# Patient Record
Sex: Male | Born: 1960
Health system: Southern US, Community
[De-identification: ages and names within clinical notes are randomized; demographics above are authoritative.]

## PROBLEM LIST (undated history)

## (undated) DIAGNOSIS — I10 Essential (primary) hypertension: Secondary | ICD-10-CM

## (undated) DIAGNOSIS — M109 Gout, unspecified: Secondary | ICD-10-CM

## (undated) DIAGNOSIS — R112 Nausea with vomiting, unspecified: Secondary | ICD-10-CM

## (undated) DIAGNOSIS — D649 Anemia, unspecified: Secondary | ICD-10-CM

## (undated) DIAGNOSIS — M1990 Unspecified osteoarthritis, unspecified site: Secondary | ICD-10-CM

## (undated) DIAGNOSIS — Z9889 Other specified postprocedural states: Secondary | ICD-10-CM

## (undated) HISTORY — PX: COLONOSCOPY: SHX174

## (undated) HISTORY — PX: FRACTURE SURGERY: SHX138

---

## 2005-10-12 ENCOUNTER — Emergency Department (HOSPITAL_COMMUNITY): Admission: EM | Admit: 2005-10-12 | Discharge: 2005-10-12 | Payer: Self-pay | Admitting: Emergency Medicine

## 2011-04-27 ENCOUNTER — Encounter (HOSPITAL_COMMUNITY): Payer: Self-pay | Admitting: Pharmacy Technician

## 2011-04-28 ENCOUNTER — Encounter (HOSPITAL_COMMUNITY): Payer: Self-pay

## 2011-04-28 ENCOUNTER — Encounter (HOSPITAL_COMMUNITY)
Admission: RE | Admit: 2011-04-28 | Discharge: 2011-04-28 | Disposition: A | Discharge status: Home / Self Care | Payer: BC Managed Care – PPO | Source: Ambulatory Visit | Attending: Orthopaedic Surgery | Admitting: Orthopaedic Surgery

## 2011-04-28 HISTORY — DX: Unspecified osteoarthritis, unspecified site: M19.90

## 2011-04-28 LAB — URINALYSIS, ROUTINE W REFLEX MICROSCOPIC
Glucose, UA: NEGATIVE mg/dL
Ketones, ur: NEGATIVE mg/dL
Leukocytes, UA: NEGATIVE
Nitrite: NEGATIVE
Protein, ur: NEGATIVE mg/dL
pH: 6 (ref 5.0–8.0)

## 2011-04-28 LAB — BASIC METABOLIC PANEL
BUN: 17 mg/dL (ref 6–23)
GFR calc non Af Amer: >90 mL/min (ref >90)
Potassium: 4 mEq/L (ref 3.5–5.1)
Sodium: 139 mEq/L (ref 135–145)

## 2011-04-28 LAB — CBC
Hemoglobin: 13.3 g/dL (ref 13.0–17.0)
Platelets: 189 10*3/uL (ref 150–400)
RDW: 13 % (ref 11.5–15.5)

## 2011-04-28 LAB — PROTIME-INR: Prothrombin Time: 12.5 seconds (ref 11.6–15.2)

## 2011-04-28 LAB — SURGICAL PCR SCREEN: Staphylococcus aureus: POSITIVE — AB

## 2011-04-28 LAB — APTT: aPTT: 29 seconds (ref 24–37)

## 2011-04-28 NOTE — Pre-Procedure Instructions (Signed)
Chest xray and EKG not required at PST appt due to negative history

## 2011-04-28 NOTE — Patient Instructions (Addendum)
20 [name]  04/28/2011   Your procedure is scheduled on:  05/01/11   Surgery [id number]  Friday  Report to [facility] at 0515 AM.  Call this number if you have problems the morning of surgery: [phone]              Terry Burton   [phone]  Remember:   Do not eat food:After Midnight. Thursday night  May have clear liquids:until Midnight .Thursday night  Clear liquids include soda, tea, black coffee, apple or grape juice, broth.  Take these medicines the morning of surgery with A SIP OF WATER: none   Do not wear jewelry, make-up or nail polish.  Do not wear lotions, powders, or perfumes. You may wear deodorant.  Do not shave 48 hours prior to surgery.  Do not bring valuables to the hospital.  Contacts, dentures or bridgework may not be worn into surgery.  Leave suitcase in the car. After surgery it may be brought to your room.  For patients admitted to the hospital, checkout time is 11:00 AM the day of discharge.   Patients discharged the day of surgery will not be allowed to drive home.  Name and phone number of your driver: wife  Special Instructions: CHG Shower Use Special Wash: 1/2 bottle night before surgery and 1/2 bottle morning of surgery.     Regular soap face and privates  Please read over the following fact sheets that you were given: MRSA Information

## 2011-05-01 ENCOUNTER — Inpatient Hospital Stay (HOSPITAL_COMMUNITY): Payer: BC Managed Care – PPO

## 2011-05-01 ENCOUNTER — Encounter (HOSPITAL_COMMUNITY): Payer: Self-pay | Admitting: Anesthesiology

## 2011-05-01 ENCOUNTER — Inpatient Hospital Stay (HOSPITAL_COMMUNITY): Payer: BC Managed Care – PPO | Admitting: Anesthesiology

## 2011-05-01 ENCOUNTER — Encounter (HOSPITAL_COMMUNITY): Payer: Self-pay | Admitting: *Deleted

## 2011-05-01 ENCOUNTER — Inpatient Hospital Stay (HOSPITAL_COMMUNITY)
Admission: RE | Admit: 2011-05-01 | Discharge: 2011-05-05 | DRG: 818 | Disposition: A | Discharge status: Home Health | Payer: BC Managed Care – PPO | Source: Ambulatory Visit | Attending: Orthopaedic Surgery | Admitting: Orthopaedic Surgery

## 2011-05-01 ENCOUNTER — Encounter (HOSPITAL_COMMUNITY)
Admission: RE | Disposition: A | Discharge status: Home Health | Payer: Self-pay | Source: Ambulatory Visit | Attending: Orthopaedic Surgery

## 2011-05-01 DIAGNOSIS — M1610 Unilateral primary osteoarthritis, unspecified hip: Principal | ICD-10-CM

## 2011-05-01 DIAGNOSIS — M8970 Major osseous defect, unspecified site: Secondary | ICD-10-CM | POA: Diagnosis present

## 2011-05-01 DIAGNOSIS — M87059 Idiopathic aseptic necrosis of unspecified femur: Secondary | ICD-10-CM | POA: Diagnosis present

## 2011-05-01 DIAGNOSIS — Z01812 Encounter for preprocedural laboratory examination: Secondary | ICD-10-CM

## 2011-05-01 DIAGNOSIS — D62 Acute posthemorrhagic anemia: Secondary | ICD-10-CM | POA: Diagnosis not present

## 2011-05-01 DIAGNOSIS — M169 Osteoarthritis of hip, unspecified: Principal | ICD-10-CM | POA: Diagnosis present

## 2011-05-01 HISTORY — PX: TOTAL HIP ARTHROPLASTY: SHX124

## 2011-05-01 LAB — TYPE AND SCREEN: ABO/RH(D): O POS

## 2011-05-01 LAB — ABO/RH: ABO/RH(D): O POS

## 2011-05-01 SURGERY — ARTHROPLASTY, HIP, TOTAL, ANTERIOR APPROACH
Anesthesia: General | Site: Hip | Laterality: Right | Wound class: Clean

## 2011-05-01 MED ORDER — ONDANSETRON HCL 4 MG/2ML IJ SOLN: 4.0000 mg | Freq: Four times a day (QID) | INTRAMUSCULAR | Status: DC | PRN

## 2011-05-01 MED ORDER — LIDOCAINE HCL (CARDIAC) 20 MG/ML IV SOLN
INTRAVENOUS | Status: DC | PRN
  Administered 2011-05-01: 50 mg via INTRAVENOUS

## 2011-05-01 MED ORDER — LACTATED RINGERS IV SOLN: INTRAVENOUS | Status: DC

## 2011-05-01 MED ORDER — DIPHENHYDRAMINE HCL 50 MG/ML IJ SOLN: 12.5000 mg | Freq: Four times a day (QID) | INTRAMUSCULAR | Status: DC | PRN

## 2011-05-01 MED ORDER — HYDROMORPHONE HCL PF 1 MG/ML IJ SOLN
0.2500 mg | INTRAMUSCULAR | Status: DC | PRN
  Administered 2011-05-01 (×3): 0.25 mg via INTRAVENOUS
  Administered 2011-05-01: 0.5 mg via INTRAVENOUS

## 2011-05-01 MED ORDER — METOCLOPRAMIDE HCL 10 MG PO TABS: 5.0000 mg | ORAL_TABLET | Freq: Three times a day (TID) | ORAL | Status: DC | PRN

## 2011-05-01 MED ORDER — OXYCODONE HCL 5 MG PO TABS
5.0000 mg | ORAL_TABLET | ORAL | Status: DC | PRN
  Administered 2011-05-04: 10 mg via ORAL
  Administered 2011-05-04 – 2011-05-05 (×4): 5 mg via ORAL
  Filled 2011-05-01 (×2): qty 1
  Filled 2011-05-01 (×2): qty 2
  Filled 2011-05-01: qty 1

## 2011-05-01 MED ORDER — NEOSTIGMINE METHYLSULFATE 1 MG/ML IJ SOLN
INTRAMUSCULAR | Status: DC | PRN
  Administered 2011-05-01: 3 mg via INTRAVENOUS

## 2011-05-01 MED ORDER — DIPHENHYDRAMINE HCL 12.5 MG/5ML PO ELIX: 12.5000 mg | ORAL_SOLUTION | Freq: Four times a day (QID) | ORAL | Status: DC | PRN

## 2011-05-01 MED ORDER — ACETAMINOPHEN 325 MG PO TABS
650.0000 mg | ORAL_TABLET | Freq: Four times a day (QID) | ORAL | Status: DC | PRN
  Administered 2011-05-01 – 2011-05-03 (×3): 650 mg via ORAL
  Filled 2011-05-01 (×3): qty 2

## 2011-05-01 MED ORDER — CEFAZOLIN SODIUM 1-5 GM-% IV SOLN
1.0000 g | Freq: Four times a day (QID) | INTRAVENOUS | Status: AC
  Administered 2011-05-01 – 2011-05-02 (×3): 1 g via INTRAVENOUS
  Filled 2011-05-01 (×4): qty 50

## 2011-05-01 MED ORDER — MENTHOL 3 MG MT LOZG: 1.0000 | LOZENGE | OROMUCOSAL | Status: DC | PRN

## 2011-05-01 MED ORDER — MEPERIDINE HCL 50 MG/ML IJ SOLN: 6.2500 mg | INTRAMUSCULAR | Status: DC | PRN

## 2011-05-01 MED ORDER — HYDROCODONE-ACETAMINOPHEN 5-325 MG PO TABS: 1.0000 | ORAL_TABLET | ORAL | Status: DC | PRN

## 2011-05-01 MED ORDER — ONDANSETRON HCL 4 MG PO TABS: 4.0000 mg | ORAL_TABLET | Freq: Four times a day (QID) | ORAL | Status: DC | PRN

## 2011-05-01 MED ORDER — PROMETHAZINE HCL 25 MG/ML IJ SOLN: 6.2500 mg | INTRAMUSCULAR | Status: DC | PRN

## 2011-05-01 MED ORDER — SODIUM CHLORIDE 0.9 % IJ SOLN: 9.0000 mL | INTRAMUSCULAR | Status: DC | PRN

## 2011-05-01 MED ORDER — MORPHINE SULFATE (PF) 1 MG/ML IV SOLN
INTRAVENOUS | Status: DC
  Administered 2011-05-01: 1 mg via INTRAVENOUS
  Administered 2011-05-01: 3 mg via INTRAVENOUS
  Administered 2011-05-02: 4.5 mg via INTRAVENOUS
  Administered 2011-05-02 (×2): via INTRAVENOUS
  Administered 2011-05-02: 7.5 mg via INTRAVENOUS
  Administered 2011-05-02: 9 mg via INTRAVENOUS
  Administered 2011-05-02: 7.5 mg via INTRAVENOUS
  Administered 2011-05-02: 3 mg via INTRAVENOUS
  Administered 2011-05-03 (×3): 4.5 mg via INTRAVENOUS
  Administered 2011-05-03 (×2): 3 mg via INTRAVENOUS
  Administered 2011-05-03: 22:00:00 via INTRAVENOUS
  Administered 2011-05-03: 1.5 mg via INTRAVENOUS
  Administered 2011-05-04: 6 mg via INTRAVENOUS
  Administered 2011-05-04: 7.5 mg via INTRAVENOUS
  Filled 2011-05-01 (×4): qty 25

## 2011-05-01 MED ORDER — GLYCOPYRROLATE 0.2 MG/ML IJ SOLN
INTRAMUSCULAR | Status: DC | PRN
  Administered 2011-05-01: .5 mg via INTRAVENOUS

## 2011-05-01 MED ORDER — CEFAZOLIN SODIUM-DEXTROSE 2-3 GM-% IV SOLR
2.0000 g | Freq: Once | INTRAVENOUS | Status: AC
  Administered 2011-05-01: 2 g via INTRAVENOUS

## 2011-05-01 MED ORDER — THERA M PLUS PO TABS
1.0000 | ORAL_TABLET | Freq: Every day | ORAL | Status: DC
  Administered 2011-05-02: 1 via ORAL
  Administered 2011-05-03 – 2011-05-04 (×2): via ORAL
  Administered 2011-05-05: 1 via ORAL
  Filled 2011-05-01 (×5): qty 1

## 2011-05-01 MED ORDER — ROCURONIUM BROMIDE 100 MG/10ML IV SOLN
INTRAVENOUS | Status: DC | PRN
  Administered 2011-05-01: 10 mg via INTRAVENOUS
  Administered 2011-05-01: 40 mg via INTRAVENOUS

## 2011-05-01 MED ORDER — METHOCARBAMOL 100 MG/ML IJ SOLN
500.0000 mg | Freq: Four times a day (QID) | INTRAVENOUS | Status: DC | PRN
  Administered 2011-05-01: 500 mg via INTRAVENOUS
  Filled 2011-05-01 (×2): qty 5

## 2011-05-01 MED ORDER — ACETAMINOPHEN 650 MG RE SUPP: 650.0000 mg | Freq: Four times a day (QID) | RECTAL | Status: DC | PRN

## 2011-05-01 MED ORDER — PHENOL 1.4 % MT LIQD: 1.0000 | OROMUCOSAL | Status: DC | PRN

## 2011-05-01 MED ORDER — METOCLOPRAMIDE HCL 5 MG/ML IJ SOLN: 5.0000 mg | Freq: Three times a day (TID) | INTRAMUSCULAR | Status: DC | PRN

## 2011-05-01 MED ORDER — MIDAZOLAM HCL 5 MG/5ML IJ SOLN
INTRAMUSCULAR | Status: DC | PRN
  Administered 2011-05-01: 2 mg via INTRAVENOUS

## 2011-05-01 MED ORDER — NALOXONE HCL 0.4 MG/ML IJ SOLN: 0.4000 mg | INTRAMUSCULAR | Status: DC | PRN

## 2011-05-01 MED ORDER — ONDANSETRON HCL 4 MG/2ML IJ SOLN
4.0000 mg | Freq: Four times a day (QID) | INTRAMUSCULAR | Status: DC | PRN
  Filled 2011-05-01: qty 2

## 2011-05-01 MED ORDER — SODIUM CHLORIDE 0.9 % IR SOLN
Status: DC | PRN
  Administered 2011-05-01: 1000 mL

## 2011-05-01 MED ORDER — METHOCARBAMOL 500 MG PO TABS
500.0000 mg | ORAL_TABLET | Freq: Four times a day (QID) | ORAL | Status: DC | PRN
  Administered 2011-05-01 – 2011-05-04 (×6): 500 mg via ORAL
  Filled 2011-05-01 (×6): qty 1

## 2011-05-01 MED ORDER — LACTATED RINGERS IV SOLN
INTRAVENOUS | Status: DC | PRN
  Administered 2011-05-01 (×3): via INTRAVENOUS

## 2011-05-01 MED ORDER — ONDANSETRON HCL 4 MG/2ML IJ SOLN
INTRAMUSCULAR | Status: DC | PRN
  Administered 2011-05-01: 4 mg via INTRAVENOUS

## 2011-05-01 MED ORDER — RIVAROXABAN 10 MG PO TABS
10.0000 mg | ORAL_TABLET | Freq: Every day | ORAL | Status: DC
  Administered 2011-05-02 – 2011-05-05 (×4): 10 mg via ORAL
  Filled 2011-05-01 (×4): qty 1

## 2011-05-01 MED ORDER — FENTANYL CITRATE 0.05 MG/ML IJ SOLN
INTRAMUSCULAR | Status: DC | PRN
  Administered 2011-05-01 (×3): 50 ug via INTRAVENOUS
  Administered 2011-05-01: 100 ug via INTRAVENOUS
  Administered 2011-05-01 (×4): 50 ug via INTRAVENOUS

## 2011-05-01 MED ORDER — MORPHINE SULFATE 2 MG/ML IJ SOLN: 1.0000 mg | INTRAMUSCULAR | Status: DC | PRN

## 2011-05-01 MED ORDER — PROPOFOL 10 MG/ML IV EMUL
INTRAVENOUS | Status: DC | PRN
  Administered 2011-05-01: 180 mg via INTRAVENOUS

## 2011-05-01 MED ORDER — ACETAMINOPHEN 10 MG/ML IV SOLN
INTRAVENOUS | Status: DC | PRN
  Administered 2011-05-01: 1000 mg via INTRAVENOUS

## 2011-05-01 MED ORDER — DIPHENHYDRAMINE HCL 12.5 MG/5ML PO ELIX: 12.5000 mg | ORAL_SOLUTION | ORAL | Status: DC | PRN

## 2011-05-01 MED ORDER — ZOLPIDEM TARTRATE 5 MG PO TABS: 5.0000 mg | ORAL_TABLET | Freq: Every evening | ORAL | Status: DC | PRN

## 2011-05-01 MED ORDER — SODIUM CHLORIDE 0.9 % IV SOLN
INTRAVENOUS | Status: DC
  Administered 2011-05-01: 19:00:00 via INTRAVENOUS
  Administered 2011-05-02 – 2011-05-03 (×3): 1000 mL via INTRAVENOUS

## 2011-05-01 SURGICAL SUPPLY — 38 items
BAG SPEC THK2 15X12 ZIP CLS (MISCELLANEOUS) ×2
BAG ZIPLOCK 12X15 (MISCELLANEOUS) ×4 IMPLANT
BLADE SAW SGTL 18X1.27X75 (BLADE) ×2 IMPLANT
CELLS DAT CNTRL 66122 CELL SVR (MISCELLANEOUS) ×1 IMPLANT
CLOTH BEACON ORANGE TIMEOUT ST (SAFETY) ×2 IMPLANT
DRAPE C-ARM 42X72 X-RAY (DRAPES) ×2 IMPLANT
DRAPE ORTHO SPLIT 77X108 STRL (DRAPES) ×2
DRAPE STERI IOBAN 125X83 (DRAPES) ×2 IMPLANT
DRAPE SURG ORHT 6 SPLT 77X108 (DRAPES) ×1 IMPLANT
DRAPE U-SHAPE 47X51 STRL (DRAPES) ×6 IMPLANT
DRSG MEPILEX BORDER 4X8 (GAUZE/BANDAGES/DRESSINGS) ×2 IMPLANT
DRSG XEROFORM 1X8 (GAUZE/BANDAGES/DRESSINGS) ×1 IMPLANT
ELECT BLADE TIP CTD 4 INCH (ELECTRODE) ×2 IMPLANT
ELECT REM PT RETURN 9FT ADLT (ELECTROSURGICAL) ×2
ELECTRODE REM PT RTRN 9FT ADLT (ELECTROSURGICAL) ×1 IMPLANT
EVACUATOR 1/8 PVC DRAIN (DRAIN) IMPLANT
FACESHIELD LNG OPTICON STERILE (SAFETY) ×8 IMPLANT
GAUZE XEROFORM 1X8 LF (GAUZE/BANDAGES/DRESSINGS) ×2 IMPLANT
GLOVE BIO SURGEON STRL SZ7 (GLOVE) ×1 IMPLANT
GLOVE BIO SURGEON STRL SZ7.5 (GLOVE) ×2 IMPLANT
GLOVE BIOGEL PI IND STRL 7.5 (GLOVE) IMPLANT
GLOVE BIOGEL PI IND STRL 8 (GLOVE) ×1 IMPLANT
GLOVE BIOGEL PI INDICATOR 7.5 (GLOVE)
GLOVE BIOGEL PI INDICATOR 8 (GLOVE) ×1
GOWN STRL REIN XL XLG (GOWN DISPOSABLE) ×2 IMPLANT
KIT BASIN OR (CUSTOM PROCEDURE TRAY) ×2 IMPLANT
PACK TOTAL JOINT (CUSTOM PROCEDURE TRAY) ×2 IMPLANT
PADDING CAST COTTON 6X4 STRL (CAST SUPPLIES) ×2 IMPLANT
RETRACTOR WND ALEXIS 18 MED (MISCELLANEOUS) ×1 IMPLANT
RTRCTR WOUND ALEXIS 18CM MED (MISCELLANEOUS) ×2
STAPLER SKIN PROX WIDE 3.9 (STAPLE) IMPLANT
SUT ETHIBOND NAB CT1 #1 30IN (SUTURE) ×4 IMPLANT
SUT MNCRL AB 4-0 PS2 18 (SUTURE) ×2 IMPLANT
SUT VIC AB 1 CT1 36 (SUTURE) ×4 IMPLANT
SUT VIC AB 2-0 CT1 27 (SUTURE) ×4
SUT VIC AB 2-0 CT1 TAPERPNT 27 (SUTURE) ×2 IMPLANT
TOWEL OR 17X26 10 PK STRL BLUE (TOWEL DISPOSABLE) ×4 IMPLANT
TRAY FOLEY CATH 14FRSI W/METER (CATHETERS) ×2 IMPLANT

## 2011-05-01 NOTE — Anesthesia Postprocedure Evaluation (Signed)
  Anesthesia Post-op Note  Patient: Terry Burton  Procedure(s) Performed:  TOTAL HIP ARTHROPLASTY ANTERIOR APPROACH  Patient Location: PACU  Anesthesia Type: General  Level of Consciousness: awake and alert   Airway and Oxygen Therapy: Patient Spontanous Breathing  Post-op Pain: mild  Post-op Assessment: Post-op Vital signs reviewed, Patient's Cardiovascular Status Stable, Respiratory Function Stable, Patent Airway and No signs of Nausea or vomiting  Post-op Vital Signs: stable  Complications: No apparent anesthesia complications

## 2011-05-01 NOTE — Op Note (Signed)
NAMEPOWELL, HALBERT NO.:  [id number]  MEDICAL RECORD NO.:  [id number]  LOCATION:  1618                         FACILITY:  [facility]  PHYSICIAN:  Vanita Panda. 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  PROCEDURE:  Right total hip arthroplasty through direct anterior approach.  IMPLANTS:  [employer] acetabular component size 54 with 2 screws, neutral +4 36 polyethylene liner, size 15 standard [place]ail femoral component, size 36 +1.5 ceramic hip ball.  SURGEON:  Vanita Panda. Magnus Ivan, M.D.  ASSISTANT:  Wende Neighbors, P.A.  ANESTHESIA:  General.  ANTIBIOTICS:  2 g IV Ancef.  BLOOD LOSS:  400 cc.  COMPLICATIONS:  None.  INDICATIONS:  Mr. Terry Burton is a 50 year old gentleman with severe pain for the last 3 to 4 years involving his right hip.  This has greatly affected his activities of daily living and his quality of life.  He takes antiinflammatories, and he has tried everything in the hip.  X- rays showed severe cystic changes in the femoral head with collapse and degenerative changes on the acetabular side and basically end-stage avascular necrosis.  At this point, he wishes to proceed with a total hip arthroplasty given the impact on his quality of life, the severity of his pain and the x-ray findings.  The risks and benefits of the surgery were explained to him in detail, including the risk of acute blood loss anemia, DVT, and PE.  PROCEDURE DESCRIPTION:  After informed consent was obtained, the appropriate right hip was marked.  He was brought to the operating room and general anesthesia was obtained while he was on a stretcher.   Foley catheter was placed, his feet were placed in traction boots for placement on the Hana fracture table.  He was placed on this table and a perineal post and padding was placed as well.  Both legs were placed in in-line skeletal traction devices, but no traction was applied.  His right hip was then prepped and draped with DuraPrep and sterile drapes. A time-out was called to identify the correct patient and correct right hip.  I then made an incision 1 cm distal and 3 cm posterior to the anterosuperior iliac spine and carried this obliquely down the leg.  I dissected down to the tensor fascia lata and then this tensor fascia lata fascia was divided longitudinally.  I then proceeded with a direct anterior approach to the hip.  A Cobra retractor was placed under the lateral neck and then up under the rectus femoris and a medial retractor was placed.  I cauterized the lateral femoral circumflex vessels and removed fat overlying the joint capsule.  I then divided the joint capsule and put retaining sutures in the joint capsule.  I then made my femoral neck cut proximal to the lesser trochanter and removed the head in its entirety.  It had significant disease and collapse.  Next, I cleaned  the acetabulum from debris and then began reaming from size 44 reamer up to a 53 with the last few reamers placed under direct fluoroscopy and direct visualization as well.  Once I was pleased with my inclination and inversion, I placed the real acetabular component in place.  I placed 2 screw holes as well.  I placed 2 screws to help secure the cup as well, and then the real 36+ 4 neutral polyethylene liner.  The acetabular component was [employer] size 54 with Gription.  Next, attention was turned to the femur.  With all traction still off the leg, a temporary hook was placed under the vastus ridge. The vastus ridge and the leg was externally rotated to 90 degrees, extended and adducted.  I  then released tissue from around the greater trochanter and placed retractors and was able to use the canal finding guide, then a broach, and began  broaching from a size 8 broach up to a size 15 broach.  The 15 broach was felt to be stable, so I trialed the standard neck and a 36+ 1 trial hip ball.  We reduced this into the acetabulum under direct fluoroscopic guidance.  We could see that the leg lengths were equal.  We could rotate him externally to past 90 degrees and it was stable, as well as internal, and there was minimal shuck.  We then removed all trial components and placed the real hydroxyapatite (HA) coated femoral component size 15 with a collar followed by a standard neck and a 36 +1.5 hip ball.  We reduced this into the acetabulum.  Again, it was felt to be stable.  We copiously irrigated the tissue.  Then, I closed the joint capsule with interrupted #1 Ethibond suture.  I reapproximated the tensor fascia lata with a running #1 Vicryl followed by 2-0 Vicryl interrupted on the subcutaneous tissue and staples on the skin.  Xeroform followed by well-padded sterile dressing was applied.  The patient was taken off the Hana table. His leg lengths were felt to be equal.  He was awakened, extubated, taken to recovery room in stable condition.  All final counts were correct.  There were no complications noted.  Of note, Maud Deed [facility] was present during the entirety of the case and her assistance was integral  for getting the case completed.     Vanita Panda. Magnus Ivan, M.D.     CYB/MEDQ  D:  05/01/2011  T:  05/01/2011  Job:  [id number]

## 2011-05-01 NOTE — H&P (Signed)
Terry Burton is an 50 y.o. male.   Chief Complaint: Severe right hip pain, know DJD/OA - likely AVN HPI: 50 yo male with severe right hip pain for greater than 3 years.  Xray with late stage AVN. This has greatly affected his quality of life.  Failed NSAIDs, time, etc.  Past Medical History  Diagnosis Date  . Arthritis     right hip and back with pain down right leg    Past Surgical History  Procedure Date  . Fracture surgery     repair right elbow years ago- no retained hardware    History reviewed. No pertinent family history. Social History:  reports that he quit smoking about 13 years ago. He has never used smokeless tobacco. He reports that he drinks about 3.6 ounces of alcohol per week. He reports that he uses illicit drugs (Marijuana).  Allergies: No Known Allergies  Medications Prior to Admission  Medication Dose Route Frequency Provider Last Rate Last Dose  . ceFAZolin (ANCEF) IVPB 2 g/50 mL premix  2 g Intravenous Once Kathryne Hitch       No current outpatient prescriptions on file as of 05/01/2011.    Results for orders placed during the hospital encounter of 05/01/11 (from the past 48 hour(s))  TYPE AND SCREEN     Status: Normal   Collection Time   05/01/11  5:35 AM      Component Value Range Comment   ABO/RH(D) O POS      Antibody Screen NEG      Sample Expiration 05/04/2011     ABO/RH     Status: Normal   Collection Time   05/01/11  5:35 AM      Component Value Range Comment   ABO/RH(D) O POS      No results found.  Review of Systems  Constitutional: Negative.   HENT: Negative.   Eyes: Negative.   Respiratory: Negative.   Cardiovascular: Negative.   Gastrointestinal: Negative.   Genitourinary: Negative.   Musculoskeletal: Positive for joint pain.  Skin: Negative.   Neurological: Negative.   Endo/Heme/Allergies: Negative.   Psychiatric/Behavioral: Negative.   All other systems reviewed and are negative.    Blood pressure 139/94,  pulse 73, temperature 97.7 F (36.5 C), temperature source Oral, resp. rate 18. Physical Exam  Constitutional: He is oriented to person, place, and time. He appears well-developed and well-nourished.  HENT:  Head: Normocephalic and atraumatic.  Eyes: EOM are normal. Pupils are equal, round, and reactive to light.  Neck: Normal range of motion. Neck supple.  Cardiovascular: Normal rate and regular rhythm.   Respiratory: Effort normal and breath sounds normal.  GI: Soft. Bowel sounds are normal.  Musculoskeletal:       Right hip: He exhibits decreased range of motion, bony tenderness and crepitus.  Neurological: He is alert and oriented to person, place, and time.  Skin: Skin is warm and dry.  Psychiatric: He has a normal mood and affect.     Assessment/Plan Severe right hip disease 1) to OR today for right total hip replacement.  Tatjana Turcott Y 05/01/2011, 7:22 AM

## 2011-05-01 NOTE — Anesthesia Procedure Notes (Signed)
Procedure Name: Intubation Date/Time: 05/01/2011 7:37 AM Performed by: Sherald Barge Pre-anesthesia Checklist: Patient identified, Emergency Drugs available, Suction available, Patient being monitored and Timeout performed Preoxygenation: Pre-oxygenation with 100% oxygen Intubation Type: IV induction Ventilation: Mask ventilation without difficulty Laryngoscope Size: Mac and 4 Grade View: Grade II Tube type: Oral Tube size: 7.5 mm Number of attempts: 1 Placement Confirmation: ETT inserted through vocal cords under direct vision,  positive ETCO2,  CO2 detector and breath sounds checked- equal and bilateral Secured at: 21 cm Tube secured with: Tape Dental Injury: Teeth and Oropharynx as per pre-operative assessment

## 2011-05-01 NOTE — Addendum Note (Signed)
Addendum  created 05/01/11 1020 by Sherald Barge   Modules edited:Anesthesia Blocks and Procedures, Inpatient Notes

## 2011-05-01 NOTE — Addendum Note (Signed)
Addendum  created 05/01/11 1020 by Malayshia All   Modules edited:Anesthesia Blocks and Procedures, Inpatient Notes    

## 2011-05-01 NOTE — Anesthesia Preprocedure Evaluation (Addendum)
Anesthesia Evaluation  Patient identified by MRN, date of birth, ID band Patient awake    Reviewed: Allergy & Precautions, H&P , NPO status , Patient's Chart, lab work & pertinent test results  Airway Mallampati: II TM Distance: >3 FB Neck ROM: Full    Dental  (+) Missing,    Pulmonary neg pulmonary ROS,  clear to auscultation  Pulmonary exam normal       Cardiovascular neg cardio ROS Regular Normal    Neuro/Psych Negative Neurological ROS  Negative Psych ROS   GI/Hepatic negative GI ROS, Neg liver ROS,   Endo/Other  Negative Endocrine ROS  Renal/GU negative Renal ROS  Genitourinary negative   Musculoskeletal negative musculoskeletal ROS (+)   Abdominal   Peds negative pediatric ROS (+)  Hematology negative hematology ROS (+)   Anesthesia Other Findings   Reproductive/Obstetrics negative OB ROS                          Anesthesia Physical Anesthesia Plan  ASA: I  Anesthesia Plan: General   Post-op Pain Management:    Induction: Intravenous  Airway Management Planned: Oral ETT  Additional Equipment:   Intra-op Plan:   Post-operative Plan: Extubation in OR  Informed Consent: I have reviewed the patients History and Physical, chart, labs and discussed the procedure including the risks, benefits and alternatives for the proposed anesthesia with the patient or authorized representative who has indicated his/her understanding and acceptance.   Dental advisory given  Plan Discussed with: CRNA  Anesthesia Plan Comments:        Anesthesia Quick Evaluation

## 2011-05-01 NOTE — Brief Op Note (Signed)
05/01/2011  9:38 AM  PATIENT:  Terry Burton  50 y.o. male  PRE-OPERATIVE DIAGNOSIS:  avascular necrosis right hip  POST-OPERATIVE DIAGNOSIS:  avascular necrosis right hip  PROCEDURE:  Procedure(s): TOTAL HIP ARTHROPLASTY ANTERIOR APPROACH  SURGEON:  Surgeon(s): Kathryne Hitch  PHYSICIAN ASSISTANT:   ASSISTANTS: Maud Deed, PA-C   ANESTHESIA:   general  EBL:  Total I/O In: 2000 [I.V.:2000] Out: 950 [Urine:550; Blood:400]  BLOOD ADMINISTERED:none  DRAINS: none   LOCAL MEDICATIONS USED:  NONE  SPECIMEN:  No Specimen  DISPOSITION OF SPECIMEN:  N/A  COUNTS:  YES  TOURNIQUET:  * No tourniquets in log *  DICTATION: .Other Dictation: Dictation Number [id number]  PLAN OF CARE: Admit to inpatient   PATIENT DISPOSITION:  PACU - hemodynamically stable.   Delay start of Pharmacological VTE agent (>24hrs) due to surgical blood loss or risk of bleeding:  {YES/NO/NOT APPLICABLE:20182

## 2011-05-01 NOTE — Transfer of Care (Signed)
Immediate Anesthesia Transfer of Care Note  Patient: Terry Burton  Procedure(s) Performed:  TOTAL HIP ARTHROPLASTY ANTERIOR APPROACH  Patient Location: PACU  Anesthesia Type: General  Level of Consciousness: sedated, patient cooperative and responds to stimulaton  Airway & Oxygen Therapy: Patient Spontanous Breathing and Patient connected to face mask oxgen  Post-op Assessment: Report given to PACU RN and Post -op Vital signs reviewed and stable  Post vital signs: Reviewed and stable  Complications: No apparent anesthesia complications

## 2011-05-02 LAB — CBC
MCHC: 32.9 g/dL (ref 30.0–36.0)
RDW: 13.3 % (ref 11.5–15.5)

## 2011-05-02 LAB — BASIC METABOLIC PANEL
BUN: 11 mg/dL (ref 6–23)
GFR calc Af Amer: >90 mL/min (ref >90)
GFR calc non Af Amer: >90 mL/min (ref >90)
Potassium: 3.8 mEq/L (ref 3.5–5.1)
Sodium: 136 mEq/L (ref 135–145)

## 2011-05-02 NOTE — Progress Notes (Signed)
Physical Therapy Evaluation Patient Details Name: Terry Burton MRN: [id number] DOB: 01-21-1961 Today's Date: 05/02/2011 9:10-9:40 EVII  Pt with ant THR who has post op pain and difficulty moving initially.  Expect he will progress according to path and D/ C to home with HHPT.  He will need a RW for home  Problem List:  Patient Active Problem List  Diagnoses  . Hip arthritis    Past Medical History:  Past Medical History  Diagnosis Date  . Arthritis     right hip and back with pain down right leg   Past Surgical History:  Past Surgical History  Procedure Date  . Fracture surgery     repair right elbow years ago- no retained hardware    PT Assessment/Plan/Recommendation PT Assessment Clinical Impression Statement: Pt one day post ant THR who has pain and likely some effects of medication.  Ex;pect he will imporve quickly and progress according to path and D/C to home with HHPT PT Recommendation/Assessment: Patient will need skilled PT in the acute care venue PT Problem List: Decreased strength;Decreased activity tolerance;Decreased mobility;Pain;Decreased knowledge of use of DME Barriers to Discharge: None Barriers to Discharge Comments: bedroom is on second floor, but pt can stay on first floor temporarily if necessasry PT Therapy Diagnosis : Difficulty walking;Generalized weakness;Acute pain PT Plan PT Frequency: 7X/week PT Treatment/Interventions: DME instruction;Gait training;Stair training;Functional mobility training;Therapeutic activities;Therapeutic exercise;Patient/family education PT Recommendation Follow Up Recommendations: Home health PT Equipment Recommended: Rolling walker with 5" wheels PT Goals  Acute Rehab PT Goals PT Goal Formulation: With patient/family Time For Goal Achievement: 7 days Pt will go Supine/Side to Sit: with modified independence PT Goal: Supine/Side to Sit - Progress: Not met Pt will go Sit to Supine/Side: with modified independence PT  Goal: Sit to Supine/Side - Progress: Not met Pt will go Sit to Stand: with modified independence PT Goal: Sit to Stand - Progress: Not met Pt will go Stand to Sit: with modified independence PT Goal: Stand to Sit - Progress: Not met Pt will Ambulate: >150 feet;with modified independence;with rolling walker PT Goal: Ambulate - Progress: Not met Pt will Go Up / Down Stairs: Flight;with supervision;with least restrictive assistive device PT Goal: Up/Down Stairs - Progress: Not met Pt will Perform Home Exercise Program: with supervision, verbal cues required/provided PT Goal: Perform Home Exercise Program - Progress: Not met  PT Evaluation Precautions/Restrictions    Prior Functioning  Home Living Lives With: Spouse Receives Help From: Family Type of Home: House Home Layout: Two level Home Access: Stairs to enter Secretary/administrator of Steps: 1 Home Adaptive Equipment: Crutches Prior Function Level of Independence: Independent with gait Driving: Yes Cognition Cognition Arousal/Alertness: Awake/alert Overall Cognitive Status: Appears within functional limits for tasks assessed Orientation Level: Oriented X4 Sensation/Coordination   Extremity Assessment RUE Assessment RUE Assessment: Within Functional Limits LUE Assessment LUE Assessment: Within Functional Limits RLE Assessment RLE Assessment: Exceptions to [facility] RLE AROM (degrees) Overall AROM Right Lower Extremity: Due to pain (improved AROM and ankle and knee with encouragement) LLE Assessment LLE Assessment: Within Functional Limits Mobility (including Balance) Bed Mobility Bed Mobility: Yes Supine to Sit: 4: Min assist Supine to Sit Details (indicate cue type and reason): pt needs assist to move right leg to edge of bed, pt using trapeze bar to assist lifting body Transfers Sit to Stand: 4: Min assist Sit to Stand Details (indicate cue type and reason): cues to push up with arms. pt has good upper extremity strength  and is able to  push up well Stand to Sit: 4: Min assist Stand to Sit Details: pt needs assist to step forward with  right leg Ambulation/Gait Ambulation/Gait:  (pt not able to ambulate this AM because of pain)  Posture/Postural Control Posture/Postural Control: No significant limitations Exercise    End of Session PT - End of Session Equipment Utilized During Treatment:  (RW) Patient left: in chair;with family/visitor present (wife, Rodney Booze) General Behavior During Session: [facility] for tasks performed Cognition: [facility] for tasks performed  Donnetta Hail 05/02/2011, 10:06 AM

## 2011-05-02 NOTE — Progress Notes (Signed)
Physical Therapy Treatment Patient Details Name: Terry Burton MRN: [id number] DOB: 09/16/1960 Today's Date: 05/02/2011  PT Assessment/Plan  Pt progressing, but is still limited by pain and decreased hip flexion and swing through phase of gait with RLL PT Goals  Acute Rehab PT Goals PT Goal: Supine/Side to Sit - Progress: Progressing toward goal PT Goal: Sit to Supine/Side - Progress: Progressing toward goal PT Goal: Sit to Stand - Progress: Progressing toward goal PT Goal: Stand to Sit - Progress: Progressing toward goal PT Goal: Ambulate - Progress: Progressing toward goal PT Goal: Perform Home Exercise Program - Progress: Progressing toward goal  PT Treatment Precautions/Restrictions  Restrictions Weight Bearing Restrictions: Yes (WBAT to RLE) Mobility (including Balance) Bed Mobility Supine to Sit: 4: Min assist Supine to Sit Details (indicate cue type and reason): pt using trapeze to lift hips up and scoot to edge Transfers Sit to Stand: 4: Min assist Stand to Sit: 4: Min assist Ambulation/Gait Ambulation/Gait Assistance: 4: Min assist Ambulation/Gait Assistance Details (indicate cue type and reason): pt needs conisitent cues to correct use of RW and to assist with RLE Swing through.  Pt tends to take small steps with leg because of decreased hip flexion Ambulation Distance (Feet): 50 Feet Assistive device: Rolling walker Gait Pattern: Decreased hip/knee flexion - right;Decreased step length - right;Antalgic Gait velocity: slow, pt reports he is limited by pain    Exercise  Total Joint Exercises Ankle Circles/Pumps: AROM;Right;10 reps;Supine Quad Sets: AROM;Right;10 reps;Supine Short Arc Quad: AROM;Strengthening;Right;10 reps;Supine Long Arc Quad: AAROM;Right;5 reps;Seated End of Session PT - End of Session Equipment Utilized During Treatment: Gait belt (RW) Activity Tolerance: Patient limited by pain Patient left: in bed;with family/visitor present Nurse  Communication:  (need for pain med) General Behavior During Session: [facility] for tasks performed Cognition: [facility] for tasks performed  Donnetta Hail 05/02/2011, 4:06 PM

## 2011-05-02 NOTE — Progress Notes (Signed)
Subjective: My hip hurts   Objective: Vital signs in last 24 hours: Temp:  [98 F (36.7 C)-99.1 F (37.3 C)] 99.1 F (37.3 C) (12/01 0540) Pulse Rate:  [74-93] 88  (12/01 0540) Resp:  [12-16] 16  (12/01 0540) BP: (115-136)/(71-91) 122/73 mmHg (12/01 0540) SpO2:  [98 %-100 %] 100 % (12/01 0947) Weight:  [82.555 kg (182 lb)] 182 lb (82.555 kg) (12/01 0540)  Intake/Output from previous day: 11/30 0701 - 12/01 0700 In: 3705 [P.O.:240; I.V.:3365; IV Piggyback:100] Out: 3575 [Urine:3175; Blood:400] Intake/Output this shift:    Exam:  Sensation intact distally Intact pulses distally Dorsiflexion/Plantar flexion intact  Labs:  Basename 05/02/11 0429  HGB 10.6*    Basename 05/02/11 0429  WBC 7.8  RBC 3.59*  HCT 32.2*  PLT 156    Basename 05/02/11 0429  NA 136  K 3.8  CL 101  CO2 30  BUN 11  CREATININE 0.89  GLUCOSE 131*  CALCIUM 8.4   No results found for this basename: LABPT:2,INR:2 in the last 72 hours  Assessment/Plan: Pt stable - not yet walked in hall - probable dc mon - labs ok   DEAN,GREGORY SCOTT 05/02/2011, 11:26 AM

## 2011-05-02 NOTE — Progress Notes (Signed)
Cm spoke with pt concerning d/c planning. MD consult for [facility] needs. Per pt choice [facility] to provide HHPT. Pt request RW and 3n1. Genevieve Norlander notified of referral. Awaiting MD orders.

## 2011-05-03 LAB — CBC
Hemoglobin: 8.6 g/dL — ABNORMAL LOW (ref 13.0–17.0)
MCHC: 32.7 g/dL (ref 30.0–36.0)
RBC: 2.9 MIL/uL — ABNORMAL LOW (ref 4.22–5.81)

## 2011-05-03 NOTE — Progress Notes (Signed)
Subjective: Feeling some better - some popping in hip - hall plus stairs x 1   Objective: Vital signs in last 24 hours: Temp:  [98.5 F (36.9 C)-99.8 F (37.7 C)] 98.5 F (36.9 C) (12/02 1354) Pulse Rate:  [76-89] 88  (12/02 1354) Resp:  [14-16] 16  (12/02 1354) BP: (124-136)/(76-80) 136/80 mmHg (12/02 1354) SpO2:  [98 %-100 %] 100 % (12/02 1600)  Intake/Output from previous day: 12/01 0701 - 12/02 0700 In: 2775 [P.O.:960; I.V.:1815] Out: 1700 [Urine:1700] Intake/Output this shift: Total I/O In: 1010 [P.O.:480; I.V.:530] Out: 425 [Urine:425]  Exam:  Sensation intact distally Intact pulses distally Dorsiflexion/Plantar flexion intact  Labs:  Basename 05/03/11 0547 05/02/11 0429  HGB 8.6* 10.6*    Basename 05/03/11 0547 05/02/11 0429  WBC 8.1 7.8  RBC 2.90* 3.59*  HCT 26.3* 32.2*  PLT 121* 156    Basename 05/02/11 0429  NA 136  K 3.8  CL 101  CO2 30  BUN 11  CREATININE 0.89  GLUCOSE 131*  CALCIUM 8.4   No results found for this basename: LABPT:2,INR:2 in the last 72 hours  Assessment/Plan: Mobilize - dc home when more ambulatory   Terry Burton 05/03/2011, 5:37 PM

## 2011-05-03 NOTE — Progress Notes (Signed)
Physical Therapy Treatment Patient Details Name: DANYEL GRIESS MRN: [id number] DOB: 30-Mar-1961 Today's Date: 05/03/2011 1435-1457, gt PT Assessment/Plan  PT - Assessment/Plan Comments on Treatment Session: Pt did well with transfers.  Difficulty progressing R  LE with gait. Plans on d/cing tomorrow with HHPT. PT Plan: Discharge plan remains appropriate;Frequency remains appropriate PT Frequency: 7X/week Follow Up Recommendations: Home health PT Equipment Recommended: Rolling walker with 5" wheels PT Goals  Acute Rehab PT Goals PT Goal Formulation: With patient/family Time For Goal Achievement: 7 days Pt will go Supine/Side to Sit: with modified independence PT Goal: Supine/Side to Sit - Progress:  (not addressed) Pt will go Sit to Supine/Side: with modified independence PT Goal: Sit to Supine/Side - Progress: Other (comment) (not addressed) Pt will go Sit to Stand: with modified independence PT Goal: Sit to Stand - Progress: Progressing toward goal Pt will go Stand to Sit: with modified independence PT Goal: Stand to Sit - Progress: Progressing toward goal Pt will Ambulate: >150 feet;with modified independence;with rolling walker PT Goal: Ambulate - Progress: Progressing toward goal Pt will Go Up / Down Stairs: Flight;with supervision;with least restrictive assistive device PT Goal: Up/Down Stairs - Progress: Progressing toward goal Pt will Perform Home Exercise Program: with supervision, verbal cues required/provided PT Goal: Perform Home Exercise Program - Progress: Other (comment) (not addressed)  PT Treatment Precautions/Restrictions  Restrictions Weight Bearing Restrictions: No Mobility (including Balance) Transfers Sit to Stand: 5: Supervision Stand to Sit: 5: Supervision Ambulation/Gait Ambulation/Gait Assistance: 4: Min assist Ambulation/Gait Assistance Details (indicate cue type and reason): verbal cues for proper sequencing ~25% of the time.  Pt has difficulty with  R LE swing through.  He states it wants to catch. Ambulation Distance (Feet): 150 Feet Assistive device: Rolling walker Gait Pattern: Decreased hip/knee flexion - right;Decreased step length - right;Antalgic Stairs: Yes Stairs Assistance: 4: Min assist Stairs Assistance Details (indicate cue type and reason): Difficulty descending getting R foot off of step. Stair Management Technique: With walker Number of Stairs: 1     Exercise    End of Session PT - End of Session Activity Tolerance: Patient tolerated treatment well Patient left: in chair;with family/visitor present Nurse Communication:  (pain level) General Behavior During Session: [facility] for tasks performed Cognition: [facility] for tasks performed  [facility] LUBECK 05/03/2011, 3:31 PM

## 2011-05-04 ENCOUNTER — Encounter (HOSPITAL_COMMUNITY): Payer: Self-pay | Admitting: Orthopaedic Surgery

## 2011-05-04 LAB — CBC
MCV: 89.3 fL (ref 78.0–100.0)
Platelets: 131 10*3/uL — ABNORMAL LOW (ref 150–400)
RBC: 2.44 MIL/uL — ABNORMAL LOW (ref 4.22–5.81)
WBC: 8.1 10*3/uL (ref 4.0–10.5)

## 2011-05-04 NOTE — Progress Notes (Signed)
Patient ID: Terry Burton, male   DOB: [birth year], 50 y.o.   MRN: [id number] Doing well. Acute blood loss anemia with hemoglobin down to 7.3 but patient is asymptomatic. AF/VSS Still on PCA Right hip incision with mod. Drainage.  Plan: 1)ween PCA 2) re-check H/H in am  2) likely d/c tomorrow

## 2011-05-04 NOTE — Progress Notes (Signed)
Iv site bleeding nsl discontinued

## 2011-05-04 NOTE — Progress Notes (Signed)
Physical Therapy Treatment Patient Details Name: Terry Burton MRN: [id number] DOB: 07-10-1960 Today's Date: 05/04/2011 9147-8295 1ghm PT Assessment/Plan  PT - Assessment/Plan Comments on Treatment Session: pt improving with advancing RLE after first few feet.  Pt has low Hgb and DC on hold. Pt with no C/O dizziness PT Plan: Discharge plan remains appropriate;Frequency remains appropriate PT Frequency: 7X/week Follow Up Recommendations: Home health PT Equipment Recommended: Rolling walker with 5" wheels PT Goals  Acute Rehab PT Goals PT Goal Formulation: With patient/family Time For Goal Achievement: 7 days Pt will go Supine/Side to Sit: with modified independence PT Goal: Supine/Side to Sit - Progress: Progressing toward goal Pt will go Sit to Supine/Side: with modified independence PT Goal: Sit to Supine/Side - Progress: Progressing toward goal Pt will go Sit to Stand: with modified independence PT Goal: Sit to Stand - Progress: Progressing toward goal Pt will go Stand to Sit: with modified independence PT Goal: Stand to Sit - Progress: Progressing toward goal Pt will Ambulate: >150 feet;with modified independence;with rolling walker PT Goal: Ambulate - Progress: Progressing toward goal Pt will Go Up / Down Stairs: Flight;with supervision;with least restrictive assistive device PT Goal: Up/Down Stairs - Progress: Progressing toward goal Pt will Perform Home Exercise Program: with supervision, verbal cues required/provided PT Goal: Perform Home Exercise Program - Progress: Progressing toward goal  PT Treatment Precautions/Restrictions  Restrictions Weight Bearing Restrictions: No Mobility (including Balance) Bed Mobility Sit to Supine - Right: 4: Min assist Sit to Supine - Right Details (indicate cue type and reason): to assist RLE onto bed Transfers Transfers: Yes Sit to Stand: 5: Supervision;From chair/3-in-1;With armrests Stand to Sit: 5: Supervision;To bed;To  chair/3-in-1;With armrests Stand to Sit Details: noted decreased R hip flexion to sit Ambulation/Gait Ambulation/Gait: Yes Ambulation/Gait Assistance: 5: Supervision Ambulation/Gait Assistance Details (indicate cue type and reason): initial difficulty to advance RLE, steps with LLE first to swung RLE, gradually able to advance R first Ambulation Distance (Feet): 150 Feet Assistive device: Rolling walker Gait Pattern: Decreased hip/knee flexion - right;Decreased step length - right;Antalgic Gait velocity: speed increased once RLE  improved with swing Stairs: No    Exercise  Total Joint Exercises Quad Sets: AROM;Right;15 reps;Supine (deferrred remainder of exer. until pain meds active) End of Session PT - End of Session Activity Tolerance: Patient tolerated treatment well Patient left: in bed;with call bell in reach;with family/visitor present Nurse Communication:  (need fro meds) General Behavior During Session: [facility] for tasks performed Cognition: [facility] for tasks performed  Rada Hay 05/04/2011, 11:40 [id number]

## 2011-05-04 NOTE — Progress Notes (Signed)
Physical Therapy Treatment Patient Details Name: Terry Burton MRN: [id number] DOB: 21-Nov-1960 Today's Date: 05/04/2011 0865-7846 g PT Assessment/Plan  PT - Assessment/Plan Comments on Treatment Session: pt improving with advancing RLE after first few feet.  Pt has low Hgb and DC on hold. Pt with no C/O dizziness PT Plan: Discharge plan remains appropriate;Frequency remains appropriate PT Frequency: 7X/week Follow Up Recommendations: Home health PT Equipment Recommended: Rolling walker with 5" wheels PT Goals  Acute Rehab PT Goals PT Goal Formulation: With patient/family Time For Goal Achievement: 7 days Pt will go Supine/Side to Sit: with modified independence PT Goal: Supine/Side to Sit - Progress: Progressing toward goal Pt will go Sit to Supine/Side: with modified independence PT Goal: Sit to Supine/Side - Progress: Progressing toward goal Pt will go Sit to Stand: with modified independence PT Goal: Sit to Stand - Progress: Progressing toward goal Pt will go Stand to Sit: with modified independence PT Goal: Stand to Sit - Progress: Progressing toward goal Pt will Ambulate: >150 feet;with modified independence;with rolling walker PT Goal: Ambulate - Progress: Progressing toward goal Pt will Go Up / Down Stairs:  (pt states he will stay on first level) Pt will Perform Home Exercise Program: with supervision, verbal cues required/provided PT Goal: Perform Home Exercise Program - Progress: Progressing toward goal  PT Treatment Precautions/Restrictions  Restrictions Weight Bearing Restrictions: No Mobility (including Balance) Bed Mobility Bed Mobility: Yes Sit to Supine - Right:  (with trapeze) Sit to Supine - Right Details (indicate cue type and reason): assist RLE onto bed, pt unable to lift RLE Transfers Transfers: Yes Stand to Sit: 5: Supervision;To bed;To elevated surface Ambulation/Gait Ambulation/Gait: Yes Ambulation/Gait Assistance: 5: Supervision Ambulation  Distance (Feet): 150 Feet Assistive device: Rolling walker Gait Pattern: Decreased hip/knee flexion - right;Decreased step length - right;Antalgic Gait velocity: speed increased once RLE  improved with swing    Exercise  Total Joint Exercises Ankle Circles/Pumps: AROM;Right;10 reps;Supine Quad Sets: AROM;10 reps;Supine Short Arc Quad: AROM;Strengthening;Right;10 reps;Supine Heel Slides: AAROM;Right;10 reps;Supine Hip ABduction/ADduction: AAROM;Right;10 reps;Seated End of Session PT - End of Session Activity Tolerance: Patient tolerated treatment well Patient left: in bed;with call bell in reach Nurse Communication:  (for MEDS) General Behavior During Session: [facility] for tasks performed Cognition: [facility] for tasks performed  Rada Hay 05/04/2011, 5:15 PM

## 2011-05-05 LAB — CBC
HCT: 21.4 % — ABNORMAL LOW (ref 39.0–52.0)
Hemoglobin: 7.1 g/dL — ABNORMAL LOW (ref 13.0–17.0)
MCV: 89.5 fL (ref 78.0–100.0)
WBC: 7.9 10*3/uL (ref 4.0–10.5)

## 2011-05-05 LAB — PREPARE RBC (CROSSMATCH)

## 2011-05-05 MED ORDER — METHOCARBAMOL 500 MG PO TABS: 500.0000 mg | ORAL_TABLET | Freq: Four times a day (QID) | ORAL | Status: AC | PRN

## 2011-05-05 MED ORDER — DOXYCYCLINE HYCLATE 100 MG PO TABS: 100.0000 mg | ORAL_TABLET | Freq: Two times a day (BID) | ORAL | Status: AC

## 2011-05-05 MED ORDER — ASPIRIN EC 325 MG PO TBEC: 325.0000 mg | DELAYED_RELEASE_TABLET | Freq: Every day | ORAL | Status: AC

## 2011-05-05 MED ORDER — OXYCODONE-ACETAMINOPHEN 5-325 MG PO TABS: 1.0000 | ORAL_TABLET | Freq: Four times a day (QID) | ORAL | Status: AC | PRN

## 2011-05-05 NOTE — Discharge Summary (Signed)
Physician Discharge Summary  Patient ID: Terry Burton MRN: [id number] DOB/AGE: 10-10-1960 50 y.o.  Admit date: 05/01/2011 Discharge date: 05/05/2011  Admission Diagnoses:  Hip arthritis  Discharge Diagnoses:  Principal Problem:  *Hip arthritis   Past Medical History  Diagnosis Date  . Arthritis     right hip and back with pain down right leg    Surgeries: Procedure(s): TOTAL HIP ARTHROPLASTY ANTERIOR APPROACH on 05/01/2011   Consultants (if any):    Discharged Condition: Improved  Hospital Course: Terry Burton is an 50 y.o. male who was admitted 05/01/2011 with a diagnosis of Hip arthritis and went to the operating room on 05/01/2011 and underwent the above named procedures.    He was given perioperative antibiotics:  Anti-infectives     Start     Dose/Rate Route Frequency Ordered Stop   05/05/11 0000   doxycycline (VIBRA-TABS) 100 MG tablet        100 mg Oral 2 times daily 05/05/11 0639 05/15/11 2359   05/01/11 1400   ceFAZolin (ANCEF) IVPB 1 g/50 mL premix        1 g 100 mL/hr over 30 Minutes Intravenous Every 6 hours 05/01/11 1116 05/02/11 0208   05/01/11 0545   ceFAZolin (ANCEF) IVPB 2 g/50 mL premix        2 g 100 mL/hr over 30 Minutes Intravenous  Once 05/01/11 0530 05/01/11 0740        .  He was given sequential compression devices, early ambulation, and chemoprophylaxis for DVT prophylaxis.  They benefited maximally from their hospital stay and there were no complications.    Recent vital signs:  Filed Vitals:   05/04/11 2310  BP: 148/81  Pulse: 105  Temp: 100 F (37.8 C)  Resp: 16    Recent laboratory studies:  Lab Results  Component Value Date   HGB 7.1* 05/05/2011   HGB 7.3* 05/04/2011   HGB 8.6* 05/03/2011   Lab Results  Component Value Date   WBC 7.9 05/05/2011   PLT 166 05/05/2011   Lab Results  Component Value Date   INR 0.91 04/28/2011   Lab Results  Component Value Date   NA 136 05/02/2011   K 3.8 05/02/2011   CL 101  05/02/2011   CO2 30 05/02/2011   BUN 11 05/02/2011   CREATININE 0.89 05/02/2011   GLUCOSE 131* 05/02/2011    Discharge Medications:   Current Discharge Medication List    START taking these medications   Details  aspirin EC 325 MG tablet Take 1 tablet (325 mg total) by mouth daily. Qty: 30 tablet, Refills: 0    doxycycline (VIBRA-TABS) 100 MG tablet Take 1 tablet (100 mg total) by mouth 2 (two) times daily. Qty: 20 tablet, Refills: 0    methocarbamol (ROBAXIN) 500 MG tablet Take 1 tablet (500 mg total) by mouth every 6 (six) hours as needed. Qty: 30 tablet, Refills: 1    oxyCODONE-acetaminophen (ROXICET) 5-325 MG per tablet Take 1-2 tablets by mouth every 6 (six) hours as needed for pain. Qty: 60 tablet, Refills: 0      CONTINUE these medications which have NOT CHANGED   Details  Multiple Vitamins-Minerals (MULTIVITAMINS THER. W/MINERALS) TABS Take 1 tablet by mouth daily.       STOP taking these medications     meloxicam (MOBIC) 15 MG tablet         Diagnostic Studies: Dg Hip Complete Right  05/01/2011  *RADIOLOGY REPORT*  Clinical Data: ORIF of right hip  RIGHT HIP - COMPLETE 2+ VIEW  Comparison: None.  Findings: C-arm spot films were obtained.  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  A right total hip replacement is noted on the C-arm spot films in good position.  IMPRESSION: Right total hip replacement with components in good position on C- arm spot films.  Original Report Authenticated By: Juline Patch, M.D.   Dg Pelvis Portable  05/01/2011  *RADIOLOGY REPORT*  Clinical Data: Status post right total hip arthroplasty.  PORTABLE PELVIS  Comparison: Intraoperative films from the same day.  Findings: Single AP view of the lower pelvis is submitted.  The patient is status post right total hip arthroplasty.  A cross-table lateral view is also submitted.  The prosthesis is located.  There is no new fracture.  Skin staples are in  place.  A Foley catheter is in place.  IMPRESSION:  1.  Status post right total hip arthroplasty without radiographic evidence for complication. 2.  No other acute abnormality.  Original Report Authenticated By: Jamesetta Orleans. MATTERN, M.D.   Dg Hip Portable 1 View Right  05/01/2011  *RADIOLOGY REPORT*  Clinical Data: Status post right total hip arthroplasty.  PORTABLE RIGHT HIP - 1 VIEW  Comparison: Single AP view the pelvis 05/01/2011.  Findings: The patient is status post right total hip arthroplasty. The hip is located.  No new fracture is evident.  Skin staples are in place.  IMPRESSION: Status post right total hip arthroplasty without radiographic evidence for complication.  The  Original Report Authenticated By: Jamesetta Orleans. MATTERN, M.D.   Dg C-arm 61-120 Min-no Report  05/01/2011  CLINICAL DATA: anterior hip   C-ARM 61-120 MINUTES  Fluoroscopy was utilized by the requesting physician.  No radiographic  interpretation.      Disposition: Final discharge disposition not confirmed  Discharge Orders    Future Orders Please Complete By Expires   Diet - low sodium heart healthy      Call MD / Call 911      Comments:   If you experience chest pain or shortness of breath, CALL 911 and be transported to the hospital emergency room.  If you develope a fever above 101 F, pus (white drainage) or increased drainage or redness at the wound, or calf pain, call your surgeon's office.   Constipation Prevention      Comments:   Drink plenty of fluids.  Prune juice may be helpful.  You may use a stool softener, such as Colace (over the counter) 100 mg twice a day.  Use MiraLax (over the counter) for constipation as needed.   Increase activity slowly as tolerated      Weight Bearing as taught in Physical Therapy      Comments:   Use a walker or crutches as instructed.   Change dressing      Comments:   You may change your dressing every other day with dry dressing.  You may get your actual incision  wet starting tomorrow.         SignedKathryne Hitch 05/05/2011, 6:41 AM

## 2011-05-05 NOTE — Progress Notes (Signed)
CM consult-Pt is planning to go home upon discharge. Spouse will be caregiver. Has been set up with [facility] who will provide [facility] services with start date of services day after discharge. DME has been arangeed by [facility] services. Raynelle Bring BSN CCM [phone]

## 2011-05-05 NOTE — Progress Notes (Signed)
Physical Therapy Treatment Patient Details Name: ARLESS VINEYARD MRN: [id number] DOB: 08/03/1960 Today's Date: 05/05/2011 14;30 - 14:55 1 gt  1 ta PT Assessment/Plan  PT - Assessment/Plan Comments on Treatment Session: Pt finished his unit of blood and is ready for D/C PT Plan: Discharge plan remains appropriate Follow Up Recommendations: Home health PT PT Goals  Acute Rehab PT Goals PT Goal Formulation: With patient Pt will go Supine/Side to Sit: with modified independence PT Goal: Supine/Side to Sit - Progress: Met Pt will go Sit to Supine/Side: with modified independence PT Goal: Sit to Supine/Side - Progress: Met Pt will go Sit to Stand: with modified independence PT Goal: Sit to Stand - Progress: Met Pt will go Stand to Sit: with modified independence PT Goal: Stand to Sit - Progress: Met Pt will Ambulate: >150 feet;with modified independence;with rolling walker PT Goal: Ambulate - Progress: Met Pt will Go Up / Down Stairs: 1-2 stairs (pt has one step to enter the home) PT Goal: Up/Down Stairs - Progress: Met Pt will Perform Home Exercise Program: with supervision, verbal cues required/provided PT Goal: Perform Home Exercise Program - Progress: Met  PT Treatment Precautions/Restrictions  Restrictions Weight Bearing Restrictions: No Mobility (including Balance) Bed Mobility Supine to Sit: 5: Supervision Supine to Sit Details (indicate cue type and reason): use of trapeze and increased time Transfers Transfers: Yes Sit to Stand: 6: Modified independent (Device/Increase time);From bed Sit to Stand Details (indicate cue type and reason): increased time Stand to Sit: 6: Modified independent (Device/Increase time);To chair/3-in-1 Stand to Sit Details: good safety tech Ambulation/Gait Ambulation/Gait: Yes Ambulation/Gait Assistance: 6: Modified independent (Device/Increase time) Ambulation Distance (Feet): 120 Feet Assistive device: Rolling walker Gait Pattern:  Step-through pattern Stairs: Yes Stairs Assistance:  (MinGuard assist) Stairs Assistance Details (indicate cue type and reason): one initial VC on proper technique and safety Stair Management Technique: No rails Number of Stairs: 1     Exercise    End of Session PT - End of Session Equipment Utilized During Treatment: Gait belt Activity Tolerance: Patient tolerated treatment well Patient left: with call bell in reach;in chair Nurse Communication:  (pt ready for D/C to home) General Behavior During Session: [facility] for tasks performed Cognition: [facility] for tasks performed  Armando Reichert 05/05/2011, 3:20 PM

## 2011-05-06 LAB — TYPE AND SCREEN
Antibody Screen: NEGATIVE
Unit division: 0

## 2012-06-09 ENCOUNTER — Encounter: Payer: Self-pay | Admitting: Internal Medicine

## 2013-01-25 ENCOUNTER — Encounter: Payer: Self-pay | Admitting: Internal Medicine

## 2013-07-13 ENCOUNTER — Emergency Department (HOSPITAL_BASED_OUTPATIENT_CLINIC_OR_DEPARTMENT_OTHER): Payer: BC Managed Care – PPO

## 2013-07-13 ENCOUNTER — Encounter (HOSPITAL_BASED_OUTPATIENT_CLINIC_OR_DEPARTMENT_OTHER): Payer: Self-pay | Admitting: Emergency Medicine

## 2013-07-13 ENCOUNTER — Emergency Department (HOSPITAL_BASED_OUTPATIENT_CLINIC_OR_DEPARTMENT_OTHER)
Admission: EM | Admit: 2013-07-13 | Discharge: 2013-07-14 | Disposition: A | Discharge status: Home / Self Care | Payer: BC Managed Care – PPO | Attending: Emergency Medicine | Admitting: Emergency Medicine

## 2013-07-13 DIAGNOSIS — K529 Noninfective gastroenteritis and colitis, unspecified: Secondary | ICD-10-CM

## 2013-07-13 DIAGNOSIS — K5289 Other specified noninfective gastroenteritis and colitis: Secondary | ICD-10-CM | POA: Insufficient documentation

## 2013-07-13 DIAGNOSIS — N289 Disorder of kidney and ureter, unspecified: Secondary | ICD-10-CM | POA: Insufficient documentation

## 2013-07-13 DIAGNOSIS — Z87891 Personal history of nicotine dependence: Secondary | ICD-10-CM | POA: Insufficient documentation

## 2013-07-13 DIAGNOSIS — Z8739 Personal history of other diseases of the musculoskeletal system and connective tissue: Secondary | ICD-10-CM | POA: Insufficient documentation

## 2013-07-13 LAB — CBC WITH DIFFERENTIAL/PLATELET
BASOS ABS: 0 10*3/uL (ref 0.0–0.1)
BASOS PCT: 0 % (ref 0–1)
EOS PCT: 0 % (ref 0–5)
Eosinophils Absolute: 0 10*3/uL (ref 0.0–0.7)
HCT: 44.3 % (ref 39.0–52.0)
Hemoglobin: 14.8 g/dL (ref 13.0–17.0)
LYMPHS PCT: 9 % — AB (ref 12–46)
Lymphs Abs: 0.6 10*3/uL — ABNORMAL LOW (ref 0.7–4.0)
MCH: 30.3 pg (ref 26.0–34.0)
MCHC: 33.4 g/dL (ref 30.0–36.0)
MCV: 90.6 fL (ref 78.0–100.0)
Monocytes Absolute: 0.5 10*3/uL (ref 0.1–1.0)
Monocytes Relative: 7 % (ref 3–12)
NEUTROS ABS: 5.9 10*3/uL (ref 1.7–7.7)
Neutrophils Relative %: 84 % — ABNORMAL HIGH (ref 43–77)
PLATELETS: 216 10*3/uL (ref 150–400)
RBC: 4.89 MIL/uL (ref 4.22–5.81)
RDW: 13.1 % (ref 11.5–15.5)
WBC: 7 10*3/uL (ref 4.0–10.5)

## 2013-07-13 LAB — URINALYSIS, ROUTINE W REFLEX MICROSCOPIC
Bilirubin Urine: NEGATIVE
Glucose, UA: NEGATIVE mg/dL
Hgb urine dipstick: NEGATIVE
Ketones, ur: NEGATIVE mg/dL
LEUKOCYTES UA: NEGATIVE
Nitrite: NEGATIVE
PROTEIN: NEGATIVE mg/dL
Specific Gravity, Urine: 1.01 (ref 1.005–1.030)
UROBILINOGEN UA: 0.2 mg/dL (ref 0.0–1.0)
pH: 6 (ref 5.0–8.0)

## 2013-07-13 LAB — BASIC METABOLIC PANEL
BUN: 19 mg/dL (ref 6–23)
CALCIUM: 9.1 mg/dL (ref 8.4–10.5)
CO2: 24 meq/L (ref 19–32)
Chloride: 103 mEq/L (ref 96–112)
Creatinine, Ser: 1.4 mg/dL — ABNORMAL HIGH (ref 0.50–1.35)
GFR calc non Af Amer: 57 mL/min — ABNORMAL LOW (ref >90)
GFR, EST AFRICAN AMERICAN: 66 mL/min — AB (ref >90)
Glucose, Bld: 119 mg/dL — ABNORMAL HIGH (ref 70–99)
Potassium: 4.1 mEq/L (ref 3.7–5.3)
SODIUM: 142 meq/L (ref 137–147)

## 2013-07-13 LAB — LIPASE, BLOOD: LIPASE: 26 U/L (ref 11–59)

## 2013-07-13 LAB — CG4 I-STAT (LACTIC ACID): LACTIC ACID, VENOUS: 0.94 mmol/L (ref 0.5–2.2)

## 2013-07-13 MED ORDER — ONDANSETRON HCL 4 MG PO TABS: 4.0000 mg | ORAL_TABLET | Freq: Four times a day (QID) | ORAL | Status: DC

## 2013-07-13 MED ORDER — SODIUM CHLORIDE 0.9 % IV SOLN: Freq: Once | INTRAVENOUS | Status: DC

## 2013-07-13 MED ORDER — ONDANSETRON HCL 4 MG/2ML IJ SOLN
4.0000 mg | Freq: Once | INTRAMUSCULAR | Status: AC
  Administered 2013-07-13: 4 mg via INTRAVENOUS
  Filled 2013-07-13: qty 2

## 2013-07-13 MED ORDER — SODIUM CHLORIDE 0.9 % IV BOLUS (SEPSIS)
1000.0000 mL | Freq: Once | INTRAVENOUS | Status: AC
  Administered 2013-07-13: 1000 mL via INTRAVENOUS

## 2013-07-13 MED ORDER — IOHEXOL 300 MG/ML  SOLN
50.0000 mL | Freq: Once | INTRAMUSCULAR | Status: AC | PRN
  Administered 2013-07-13: 50 mL via ORAL

## 2013-07-13 NOTE — ED Provider Notes (Addendum)
CSN: [id number]     Arrival date & time 07/13/13  1746 History  This chart was scribed for Terry OctaveStephen Moriya Mitchell, MD by Terry Burton, ED scribe. This patient was seen in room MH01/MH01 and the patient's care was started at 6:20 PM.   Chief Complaint  Patient presents with  . Emesis  . Diarrhea   The history is provided by the patient. No language interpreter was used.  HPI Comments: Terry HyDouglas J Burton is a 53 y.o. male who presents to the Emergency Department complaining of diarrhea and vomiting, onset 1 day ago. Reports 5-6 emetic episodes today; last episode 4-5 hours ago. Reports more than 10 episode of diarrhea today. Does not report fever, but wife states he felt hot earlier. Pt has potential sick contact at home. Pt has not been out of the country recently; but states he was in [place] one day prior. Has not had any abdominal surgery. Denies any new or recent food intake. Denies any other medical problems. Denies abdominal pain, hematochezia, hematemesis, dizziness, lightheadedness, chest pain, SOB, testicular pain.  Past Medical History  Diagnosis Date  . Arthritis     right hip and back with pain down right leg   Past Surgical History  Procedure Laterality Date  . Fracture surgery      repair right elbow years ago- no retained hardware  . 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 History  Substance Use Topics  . Smoking status: Former Smoker -- 2.00 packs/day for 10 years    Quit date: 04/27/1998  . Smokeless tobacco: Never Used  . Alcohol Use: 3.6 oz/week    6 Cans of beer per week    Review of Systems  A complete 10 system review of systems was obtained and all systems are negative except as noted in the HPI and PMH.   Allergies  Review of patient's allergies indicates no known allergies.  Home  Medications   Current Outpatient Rx  Name  Route  Sig  Dispense  Refill  . Multiple Vitamins-Minerals (MULTIVITAMINS THER. W/MINERALS) TABS   Oral   Take 1 tablet by mouth daily.          . ondansetron (ZOFRAN) 4 MG tablet   Oral   Take 1 tablet (4 mg total) by mouth every 6 (six) hours.   12 tablet   0    Triage Vitals: BP 99/69  Pulse 96  Temp(Src) 98.7 F (37.1 C) (Oral)  Resp 16  Ht 6\' 1"  (1.854 m)  Wt 188 lb (85.276 kg)  BMI 24.81 kg/m2  SpO2 99%  Physical Exam  Nursing note and vitals reviewed. Constitutional: He is oriented to person, place, and time. He appears well-developed and well-nourished.  Appears ill.  HENT:  Head: Normocephalic and atraumatic.  Mouth/Throat: Oropharynx is clear and moist.  Eyes: Conjunctivae and EOM are normal. Pupils are equal, round, and reactive to light.  Neck: Normal range of motion.  Cardiovascular: Normal rate, regular rhythm and normal heart sounds.  Exam reveals no gallop and no friction rub.   No murmur heard. Pulmonary/Chest: Effort normal. No respiratory distress.  Abdominal: Soft. He exhibits no distension and no mass. There is tenderness. There is no rebound and no guarding.  Mild diffuse tenderness  Musculoskeletal: Normal range of motion. He exhibits no tenderness.  Neurological: He is oriented  to person, place, and time. He has normal reflexes.  Skin: Skin is warm and dry.  Psychiatric: He has a normal mood and affect. His behavior is normal.   ED Course  Procedures  DIAGNOSTIC STUDIES: Oxygen Saturation is 99% on room air, normal by my interpretation.    COORDINATION OF CARE: At 6:23 PM: Discussed treatment plan with patient which includes giving the patient fluids. Patient agrees.    Labs Review Labs Reviewed  CBC WITH DIFFERENTIAL - Abnormal; Notable for the following:    Neutrophils Relative % 84 (*)    Lymphocytes Relative 9 (*)    Lymphs Abs 0.6 (*)    All other components within normal limits   BASIC METABOLIC PANEL - Abnormal; Notable for the following:    Glucose, Bld 119 (*)    Creatinine, Ser 1.40 (*)    GFR calc non Af Amer 57 (*)    GFR calc Af Amer 66 (*)    All other components within normal limits  URINALYSIS, ROUTINE W REFLEX MICROSCOPIC  LIPASE, BLOOD  CG4 I-STAT (LACTIC ACID)   Imaging Review Ct Abdomen Pelvis Wo Contrast  07/13/2013   CLINICAL DATA:  Nausea, vomiting, diarrhea  EXAM: CT ABDOMEN AND PELVIS WITHOUT CONTRAST  TECHNIQUE: Multidetector CT imaging of the abdomen and pelvis was performed following the standard protocol without intravenous contrast.  COMPARISON:  None.  FINDINGS: The liver, spleen, pancreas, adrenal glands and kidneys are normal. There is no nephrolithiasis or hydroureteronephrosis bilaterally. The aorta demonstrate atherosclerosis without aneurysmal dilatation. There is no abdominal lymphadenopathy. There is no small bowel obstruction or diverticulitis. The appendix is normal.  Partial fluid-filled bladder is normal. The prostate gland is normal. Right hip replacement is identified. There is a 3 mm peripheral nodule in the lateral basal segment of the left lower lobe. Degenerative joint changes of the lower spine are noted.  IMPRESSION: No acute abnormality identified in the abdomen and pelvis.  3 mm nodule in the left lower lobe. If the patient is at high risk for bronchogenic carcinoma, follow-up chest CT at 1 year is recommended. If the patient is at low risk, no follow-up is needed. This recommendation follows the consensus statement: Guidelines for Management of Small Pulmonary Nodules Detected on CT Scans: A Statement from the Fleischner Society as published in Radiology 2005; 237:395-400.   Electronically Signed   By: Sherian Rein M.D.   On: 07/13/2013 20:35    EKG Interpretation   None       MDM   Final diagnoses:  Gastroenteritis  Renal insufficiency   2 day history of vomiting, diarrhea, abdominal pain. Sick contacts at home.  Denies any fever. No recent travel or antibiotic use.  Abdomen is soft without peritoneal signs. Vital stable.  Suspected viral gastroenteritis. Will treat with IV fluids, antiemetics. Labs are unremarkable. Creatinine slightly elevated at 1.4.  Lactate normal. CT scan shows no acute abnormalities. Lung nodule noted and patient informed about need for 1 year follow up. Patient is tolerating PO and able to ambulate.  No vomiting or stooling in ED. Stable for discharge home with PO zofran.  Follow up with PCP this week for recheck of kidney function. Return precautions discussed.  BP 107/73  Pulse 71  Temp(Src) 98.6 F (37 C) (Oral)  Resp 18  Ht 6\' 1"  (1.854 m)  Wt 188 lb (85.276 kg)  BMI 24.81 kg/m2  SpO2 99%   I personally performed the services described in this documentation, which was scribed in my  presence. The recorded information has been reviewed and is accurate.      Terry Octave, MD 07/13/13 8119  Terry Octave, MD 07/13/13 [phone]

## 2013-07-13 NOTE — ED Notes (Signed)
Patient unable to provide urine specimen at this time.

## 2013-07-13 NOTE — ED Notes (Signed)
Vomiting and diarrhea since last night. Feels weak. Looks sick.

## 2013-07-13 NOTE — Discharge Instructions (Signed)
Viral Gastroenteritis Followup with your Dr. for a recheck your kidney function this week. You need to have a CT scan of your chest in 1 year. Return to the ED if you or unable to keep anything down. or have any other sick concerns. Viral gastroenteritis is also known as stomach flu. This condition affects the stomach and intestinal tract. It can cause sudden diarrhea and vomiting. The illness typically lasts 3 to 8 days. Most people develop an immune response that eventually gets rid of the virus. While this natural response develops, the virus can make you quite ill. CAUSES  Many different viruses can cause gastroenteritis, such as rotavirus or noroviruses. You can catch one of these viruses by consuming contaminated food or water. You may also catch a virus by sharing utensils or other personal items with an infected person or by touching a contaminated surface. SYMPTOMS  The most common symptoms are diarrhea and vomiting. These problems can cause a severe loss of body fluids (dehydration) and a body salt (electrolyte) imbalance. Other symptoms may include:  Fever.  Headache.  Fatigue.  Abdominal pain. DIAGNOSIS  Your caregiver can usually diagnose viral gastroenteritis based on your symptoms and a physical exam. A stool sample may also be taken to test for the presence of viruses or other infections. TREATMENT  This illness typically goes away on its own. Treatments are aimed at rehydration. The most serious cases of viral gastroenteritis involve vomiting so severely that you are not able to keep fluids down. In these cases, fluids must be given through an intravenous line (IV). HOME CARE INSTRUCTIONS   Drink enough fluids to keep your urine clear or pale yellow. Drink small amounts of fluids frequently and increase the amounts as tolerated.  Ask your caregiver for specific rehydration instructions.  Avoid:  Foods high in sugar.  Alcohol.  Carbonated  drinks.  Tobacco.  Juice.  Caffeine drinks.  Extremely hot or cold fluids.  Fatty, greasy foods.  Too much intake of anything at one time.  Dairy products until 24 to 48 hours after diarrhea stops.  You may consume probiotics. Probiotics are active cultures of beneficial bacteria. They may lessen the amount and number of diarrheal stools in adults. Probiotics can be found in yogurt with active cultures and in supplements.  Wash your hands well to avoid spreading the virus.  Only take over-the-counter or prescription medicines for pain, discomfort, or fever as directed by your caregiver. Do not give aspirin to children. Antidiarrheal medicines are not recommended.  Ask your caregiver if you should continue to take your regular prescribed and over-the-counter medicines.  Keep all follow-up appointments as directed by your caregiver. SEEK IMMEDIATE MEDICAL CARE IF:   You are unable to keep fluids down.  You do not urinate at least once every 6 to 8 hours.  You develop shortness of breath.  You notice blood in your stool or vomit. This may look like coffee grounds.  You have abdominal pain that increases or is concentrated in one small area (localized).  You have persistent vomiting or diarrhea.  You have a fever.  The patient is a child younger than 3 months, and he or she has a fever.  The patient is a child older than 3 months, and he or she has a fever and persistent symptoms.  The patient is a child older than 3 months, and he or she has a fever and symptoms suddenly get worse.  The patient is a baby, and he or  she has no tears when crying. MAKE SURE YOU:   Understand these instructions.  Will watch your condition.  Will get help right away if you are not doing well or get worse. Document Released: 05/18/2005 Document Revised: 08/10/2011 Document Reviewed: 03/04/2011 [facility] Patient Information 2014 [place].

## 2013-07-13 NOTE — ED Notes (Addendum)
Pt ambulated with 98%spo2 and heart rate of 134, Pt stated no pain, no headache, no dizziness, and no stomach pain. Pt drunk water and soda without any discomfort.

## 2013-07-13 NOTE — ED Notes (Signed)
Gave Pt a set of xlrg scrubs due to Pt soiling him self

## 2014-12-01 ENCOUNTER — Emergency Department (HOSPITAL_BASED_OUTPATIENT_CLINIC_OR_DEPARTMENT_OTHER)
Admission: EM | Admit: 2014-12-01 | Discharge: 2014-12-01 | Disposition: A | Discharge status: Home / Self Care | Payer: BLUE CROSS/BLUE SHIELD | Attending: Emergency Medicine | Admitting: Emergency Medicine

## 2014-12-01 ENCOUNTER — Encounter (HOSPITAL_BASED_OUTPATIENT_CLINIC_OR_DEPARTMENT_OTHER): Payer: Self-pay | Admitting: *Deleted

## 2014-12-01 DIAGNOSIS — Z79899 Other long term (current) drug therapy: Secondary | ICD-10-CM | POA: Insufficient documentation

## 2014-12-01 DIAGNOSIS — M109 Gout, unspecified: Secondary | ICD-10-CM | POA: Insufficient documentation

## 2014-12-01 DIAGNOSIS — Z87891 Personal history of nicotine dependence: Secondary | ICD-10-CM | POA: Diagnosis not present

## 2014-12-01 MED ORDER — METHYLPREDNISOLONE SODIUM SUCC 125 MG IJ SOLR
125.0000 mg | Freq: Once | INTRAMUSCULAR | Status: DC
  Filled 2014-12-01: qty 2

## 2014-12-01 MED ORDER — INDOMETHACIN 25 MG PO CAPS: 25.0000 mg | ORAL_CAPSULE | Freq: Three times a day (TID) | ORAL | Status: DC | PRN

## 2014-12-01 MED ORDER — METHYLPREDNISOLONE SODIUM SUCC 125 MG IJ SOLR
125.0000 mg | Freq: Once | INTRAMUSCULAR | Status: AC
  Administered 2014-12-01: 125 mg via INTRAMUSCULAR

## 2014-12-01 NOTE — ED Notes (Signed)
Pt reports gout flare up in his left ankle.

## 2014-12-01 NOTE — Discharge Instructions (Signed)

## 2014-12-01 NOTE — ED Provider Notes (Signed)
CSN: [id number]     Arrival date & time 12/01/14  1238 History   First MD Initiated Contact with Patient 12/01/14 1307     Chief Complaint  Patient presents with  . Gout     (Consider location/radiation/quality/duration/timing/severity/associated sxs/prior Treatment) Patient is a 54 y.o. male presenting with ankle pain. The history is provided by the patient. No language interpreter was used.  Ankle Pain Location:  Ankle Time since incident:  2 days Injury: yes   Ankle location:  L ankle Pain details:    Quality:  Aching   Radiates to:  Does not radiate   Severity:  Moderate   Onset quality:  Gradual   Timing:  Constant   Progression:  Worsening Chronicity:  New Dislocation: no   Foreign body present:  No foreign bodies Tetanus status:  Up to date Relieved by:  Nothing Worsened by:  Nothing tried Ineffective treatments:  None tried Associated symptoms: swelling   Risk factors: no known bone disorder     Past Medical History  Diagnosis Date  . Arthritis     right hip and back with pain down right leg   Past Surgical History  Procedure Laterality Date  . Fracture surgery      repair right elbow years ago- no retained hardware  . 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 No pertinent family history. History  Substance Use Topics  . Smoking status: Former Smoker -- 2.00 packs/day for 10 years    Quit date: 04/27/1998  . Smokeless tobacco: Never Used  . Alcohol Use: 3.6 oz/week    6 Cans of beer per week    Review of Systems  Musculoskeletal: Positive for myalgias and joint swelling.      Allergies  Review of patient's allergies indicates no known allergies.  Home Medications   Prior to Admission medications   Medication Sig Start Date End Date Taking? Authorizing Provider  indomethacin  (INDOCIN) 25 MG capsule Take 1 capsule (25 mg total) by mouth 3 (three) times daily as needed. 12/01/14   Elson AreasLeslie K Loan Oguin, PA-C  Multiple Vitamins-Minerals (MULTIVITAMINS THER. W/MINERALS) TABS Take 1 tablet by mouth daily.     Historical Provider, MD  ondansetron (ZOFRAN) 4 MG tablet Take 1 tablet (4 mg total) by mouth every 6 (six) hours. 07/13/13   Glynn OctaveStephen Rancour, MD   BP 138/95 mmHg  Pulse 78  Temp(Src) 98 F (36.7 C) (Oral)  Resp 18  Ht 6\' 1"  (1.854 m)  Wt 189 lb (85.73 kg)  BMI 24.94 kg/m2  SpO2 97% Physical Exam  Constitutional: He is oriented to person, place, and time. He appears well-developed and well-nourished.  HENT:  Head: Normocephalic.  Eyes: EOM are normal.  Neck: Normal range of motion.  Pulmonary/Chest: Effort normal.  Abdominal: He exhibits no distension.  Musculoskeletal: He exhibits tenderness.  Tender left ankle, swelling, nv and ns intact    Neurological: He is alert and oriented to person, place, and time.  Skin: Skin is warm.  Psychiatric: He has a normal mood and affect.  Nursing note and vitals reviewed.   ED Course  Procedures (including critical care time) Labs Review Labs Reviewed - No data to display  Imaging Review No results found.   EKG Interpretation None      MDM  Pt has had gout in the past, feels the  same   Final diagnoses:  Acute gout of left ankle, unspecified cause    Solumedrol IM Indocin AVS    Elson Areas, PA-C 12/01/14 1557  Linwood Dibbles, MD 12/02/14 1013

## 2015-02-03 ENCOUNTER — Emergency Department (HOSPITAL_BASED_OUTPATIENT_CLINIC_OR_DEPARTMENT_OTHER)
Admission: EM | Admit: 2015-02-03 | Discharge: 2015-02-03 | Disposition: A | Discharge status: Home / Self Care | Payer: BLUE CROSS/BLUE SHIELD | Attending: Emergency Medicine | Admitting: Emergency Medicine

## 2015-02-03 ENCOUNTER — Encounter (HOSPITAL_BASED_OUTPATIENT_CLINIC_OR_DEPARTMENT_OTHER): Payer: Self-pay | Admitting: Emergency Medicine

## 2015-02-03 DIAGNOSIS — M10061 Idiopathic gout, right knee: Secondary | ICD-10-CM | POA: Insufficient documentation

## 2015-02-03 DIAGNOSIS — M109 Gout, unspecified: Secondary | ICD-10-CM

## 2015-02-03 DIAGNOSIS — M25561 Pain in right knee: Secondary | ICD-10-CM | POA: Diagnosis present

## 2015-02-03 DIAGNOSIS — M1990 Unspecified osteoarthritis, unspecified site: Secondary | ICD-10-CM | POA: Insufficient documentation

## 2015-02-03 DIAGNOSIS — Z87891 Personal history of nicotine dependence: Secondary | ICD-10-CM | POA: Diagnosis not present

## 2015-02-03 MED ORDER — INDOMETHACIN 25 MG PO CAPS: 25.0000 mg | ORAL_CAPSULE | Freq: Three times a day (TID) | ORAL | Status: DC | PRN

## 2015-02-03 MED ORDER — HYDROCODONE-ACETAMINOPHEN 5-325 MG PO TABS: 1.0000 | ORAL_TABLET | ORAL | Status: DC | PRN

## 2015-02-03 MED ORDER — DEXAMETHASONE SODIUM PHOSPHATE 10 MG/ML IJ SOLN
10.0000 mg | Freq: Once | INTRAMUSCULAR | Status: AC
  Administered 2015-02-03: 10 mg via INTRAMUSCULAR
  Filled 2015-02-03: qty 1

## 2015-02-03 NOTE — ED Notes (Signed)
Pt reports swelling and pain to right knee, reports gout flare up in past, took gout medication with no improvement

## 2015-02-03 NOTE — Discharge Instructions (Signed)
Take Vicodin for severe pain only. No driving or operating heavy machinery while taking vicodin. This medication may cause drowsiness. Take indomethacin as directed. Follow-up with your primary care physician.  Gout Gout is an inflammatory arthritis caused by a buildup of uric acid crystals in the joints. Uric acid is a chemical that is normally present in the blood. When the level of uric acid in the blood is too high it can form crystals that deposit in your joints and tissues. This causes joint redness, soreness, and swelling (inflammation). Repeat attacks are common. Over time, uric acid crystals can form into masses (tophi) near a joint, destroying bone and causing disfigurement. Gout is treatable and often preventable. CAUSES  The disease begins with elevated levels of uric acid in the blood. Uric acid is produced by your body when it breaks down a naturally found substance called purines. Certain foods you eat, such as meats and fish, contain high amounts of purines. Causes of an elevated uric acid level include:  Being passed down from parent to child (heredity).  Diseases that cause increased uric acid production (such as obesity, psoriasis, and certain cancers).  Excessive alcohol use.  Diet, especially diets rich in meat and seafood.  Medicines, including certain cancer-fighting medicines (chemotherapy), water pills (diuretics), and aspirin.  Chronic kidney disease. The kidneys are no longer able to remove uric acid well.  Problems with metabolism. Conditions strongly associated with gout include:  Obesity.  High blood pressure.  High cholesterol.  Diabetes. Not everyone with elevated uric acid levels gets gout. It is not understood why some people get gout and others do not. Surgery, joint injury, and eating too much of certain foods are some of the factors that can lead to gout attacks. SYMPTOMS   An attack of gout comes on quickly. It causes intense pain with redness,  swelling, and warmth in a joint.  Fever can occur.  Often, only one joint is involved. Certain joints are more commonly involved:  Base of the big toe.  Knee.  Ankle.  Wrist.  Finger. Without treatment, an attack usually goes away in a few days to weeks. Between attacks, you usually will not have symptoms, which is different from many other forms of arthritis. DIAGNOSIS  Your caregiver will suspect gout based on your symptoms and exam. In some cases, tests may be recommended. The tests may include:  Blood tests.  Urine tests.  X-rays.  Joint fluid exam. This exam requires a needle to remove fluid from the joint (arthrocentesis). Using a microscope, gout is confirmed when uric acid crystals are seen in the joint fluid. TREATMENT  There are two phases to gout treatment: treating the sudden onset (acute) attack and preventing attacks (prophylaxis).  Treatment of an Acute Attack.  Medicines are used. These include anti-inflammatory medicines or steroid medicines.  An injection of steroid medicine into the affected joint is sometimes necessary.  The painful joint is rested. Movement can worsen the arthritis.  You may use warm or cold treatments on painful joints, depending which works best for you.  Treatment to Prevent Attacks.  If you suffer from frequent gout attacks, your caregiver may advise preventive medicine. These medicines are started after the acute attack subsides. These medicines either help your kidneys eliminate uric acid from your body or decrease your uric acid production. You may need to stay on these medicines for a very long time.  The early phase of treatment with preventive medicine can be associated with an increase in  acute gout attacks. For this reason, during the first few months of treatment, your caregiver may also advise you to take medicines usually used for acute gout treatment. Be sure you understand your caregiver's directions. Your caregiver may  make several adjustments to your medicine dose before these medicines are effective.  Discuss dietary treatment with your caregiver or dietitian. Alcohol and drinks high in sugar and fructose and foods such as meat, poultry, and seafood can increase uric acid levels. Your caregiver or dietitian can advise you on drinks and foods that should be limited. HOME CARE INSTRUCTIONS   Do not take aspirin to relieve pain. This raises uric acid levels.  Only take over-the-counter or prescription medicines for pain, discomfort, or fever as directed by your caregiver.  Rest the joint as much as possible. When in bed, keep sheets and blankets off painful areas.  Keep the affected joint raised (elevated).  Apply warm or cold treatments to painful joints. Use of warm or cold treatments depends on which works best for you.  Use crutches if the painful joint is in your leg.  Drink enough fluids to keep your urine clear or pale yellow. This helps your body get rid of uric acid. Limit alcohol, sugary drinks, and fructose drinks.  Follow your dietary instructions. Pay careful attention to the amount of protein you eat. Your daily diet should emphasize fruits, vegetables, whole grains, and fat-free or low-fat milk products. Discuss the use of coffee, vitamin C, and cherries with your caregiver or dietitian. These may be helpful in lowering uric acid levels.  Maintain a healthy body weight. SEEK MEDICAL CARE IF:   You develop diarrhea, vomiting, or any side effects from medicines.  You do not feel better in 24 hours, or you are getting worse. SEEK IMMEDIATE MEDICAL CARE IF:   Your joint becomes suddenly more tender, and you have chills or a fever. MAKE SURE YOU:   Understand these instructions.  Will watch your condition.  Will get help right away if you are not doing well or get worse. Document Released: 05/15/2000 Document Revised: 10/02/2013 Document Reviewed: 12/30/2011 [facility] Patient  Information 2015 [place], [place]. This information is not intended to replace advice given to you by your health care provider. Make sure you discuss any questions you have with your health care provider.  Low-Purine Diet Purines are compounds that affect the level of uric acid in your body. A low-purine diet is a diet that is low in purines. Eating a low-purine diet can prevent the level of uric acid in your body from getting too high and causing gout or kidney stones or both. WHAT DO I NEED TO KNOW ABOUT THIS DIET?  Choose low-purine foods. Examples of low-purine foods are listed in the next section.  Drink plenty of fluids, especially water. Fluids can help remove uric acid from your body. Try to drink 8-16 cups (1.9-3.8 L) a day.  Limit foods high in fat, especially saturated fat, as fat makes it harder for the body to get rid of uric acid. Foods high in saturated fat include pizza, cheese, ice cream, whole milk, fried foods, and gravies. Choose foods that are lower in fat and lean sources of protein. Use olive oil when cooking as it contains healthy fats that are not high in saturated fat.  Limit alcohol. Alcohol interferes with the elimination of uric acid from your body. If you are having a gout attack, avoid all alcohol.  Keep in mind that different people's bodies react  differently to different foods. You will probably learn over time which foods do or do not affect you. If you discover that a food tends to cause your gout to flare up, avoid eating that food. You can more freely enjoy foods that do not cause problems. If you have any questions about a food item, talk to your dietitian or health care provider. WHICH FOODS ARE LOW, MODERATE, AND HIGH IN PURINES? The following is a list of foods that are low, moderate, and high in purines. You can eat any amount of the foods that are low in purines. You may be able to have small amounts of foods that are moderate in purines. Ask your health care  provider how much of a food moderate in purines you can have. Avoid foods high in purines. Grains  Foods low in purines: Enriched white bread, pasta, rice, cake, cornbread, popcorn.  Foods moderate in purines: Whole-grain breads and cereals, wheat germ, bran, oatmeal. Uncooked oatmeal. Dry wheat bran or wheat germ.  Foods high in purines: Pancakes, [place] toast, biscuits, muffins. Vegetables  Foods low in purines: All vegetables, except those that are moderate in purines.  Foods moderate in purines: Asparagus, cauliflower, spinach, mushrooms, green peas. Fruits  All fruits are low in purines. Meats and other Protein Foods  Foods low in purines: Eggs, nuts, peanut butter.  Foods moderate in purines: 80-90% lean beef, lamb, veal, pork, poultry, fish, eggs, peanut butter, nuts. Crab, lobster, oysters, and shrimp. Cooked dried beans, peas, and lentils.  Foods high in purines: Anchovies, sardines, herring, mussels, tuna, codfish, scallops, trout, and haddock. Berniece Salines. Organ meats (such as liver or kidney). Tripe. Game meat. Goose. Sweetbreads. Dairy  All dairy foods are low in purines. Low-fat and fat-free dairy products are best because they are low in saturated fat. Beverages  Drinks low in purines: Water, carbonated beverages, tea, coffee, cocoa.  Drinks moderate in purines: Soft drinks and other drinks sweetened with high-fructose corn syrup. Juices. To find whether a food or drink is sweetened with high-fructose corn syrup, look at the ingredients list.  Drinks high in purines: Alcoholic beverages (such as beer). Condiments  Foods low in purines: Salt, herbs, olives, pickles, relishes, vinegar.  Foods moderate in purines: Butter, margarine, oils, mayonnaise. Fats and Oils  Foods low in purines: All types, except gravies and sauces made with meat.  Foods high in purines: Gravies and sauces made with meat. Other Foods  Foods low in purines: Sugars, sweets, gelatin. Cake.  Soups made without meat.  Foods moderate in purines: Meat-based or fish-based soups, broths, or bouillons. Foods and drinks sweetened with high-fructose corn syrup.  Foods high in purines: High-fat desserts (such as ice cream, cookies, cakes, pies, doughnuts, and chocolate). Contact your dietitian for more information on foods that are not listed here. Document Released: 09/12/2010 Document Revised: 05/23/2013 Document Reviewed: 04/24/2013 [facility] Patient Information 2015 [place], [place]. This information is not intended to replace advice given to you by your health care provider. Make sure you discuss any questions you have with your health care provider.

## 2015-02-03 NOTE — ED Provider Notes (Signed)
CSN: [id number]     Arrival date & time 02/03/15  1726 History   First MD Initiated Contact with Patient 02/03/15 1730     Chief Complaint  Patient presents with  . Knee Pain     (Consider location/radiation/quality/duration/timing/severity/associated sxs/prior Treatment) HPI Comments: 54 year old male with a past medical history of gout presenting with right knee pain and swelling 3 days. This feels similar to his prior gout flares, last flare was about 2 months ago. Pain began shortly after eating baked beans. Tried taking indomethacin with minimal relief. With his last gout flare, he was given a standard injection with almost immediate relief of his pain. No injury or trauma to his right knee. No fevers. Does not take daily medication for gout.  Patient is a 54 y.o. male presenting with knee pain. The history is provided by the patient.  Knee Pain Location:  Knee Time since incident:  3 days Injury: no   Knee location:  R knee Pain details:    Quality:  Throbbing   Radiates to:  Does not radiate   Severity:  Moderate   Onset quality:  Sudden   Duration:  3 days   Timing:  Constant   Progression:  Unchanged Chronicity:  Recurrent Dislocation: no   Foreign body present:  No foreign bodies Relieved by:  Nothing Worsened by:  Bearing weight and flexion Ineffective treatments: indomethacin. Associated symptoms: no fever     Past Medical History  Diagnosis Date  . Arthritis     right hip and back with pain down right leg   Past Surgical History  Procedure Laterality Date  . Fracture surgery      repair right elbow years ago- no retained hardware  . 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 No pertinent family history. Social History  Substance Use Topics  . Smoking status: Former Smoker -- 2.00 packs/day for  10 years    Quit date: 04/27/1998  . Smokeless tobacco: Never Used  . Alcohol Use: 3.6 oz/week    6 Cans of beer per week    Review of Systems  Constitutional: Negative for fever.  Musculoskeletal: Positive for joint swelling and arthralgias.  All other systems reviewed and are negative.     Allergies  Review of patient's allergies indicates no known allergies.  Home Medications   Prior to Admission medications   Medication Sig Start Date End Date Taking? Authorizing Provider  HYDROcodone-acetaminophen (NORCO/VICODIN) 5-325 MG per tablet Take 1-2 tablets by mouth every 4 (four) hours as needed. 02/03/15   Cashis Rill M Lucetta Baehr, PA-C  indomethacin (INDOCIN) 25 MG capsule Take 1 capsule (25 mg total) by mouth 3 (three) times daily as needed. 02/03/15   Hudson Majkowski M Shameria Trimarco, PA-C   BP 123/67 mmHg  Pulse 84  Temp(Src) 98.3 F (36.8 C) (Oral)  Resp 18  Ht 6\' 1"  (1.854 m)  Wt 189 lb (85.73 kg)  BMI 24.94 kg/m2  SpO2 97% Physical Exam  Constitutional: He is oriented to person, place, and time. He appears well-developed and well-nourished. No distress.  HENT:  Head: Normocephalic and atraumatic.  Eyes: Conjunctivae and EOM are normal.  Neck: Normal range of motion. Neck supple.  Cardiovascular: Normal rate, regular rhythm, normal heart sounds and intact distal pulses.   Pulmonary/Chest: Effort normal and breath sounds normal.  Musculoskeletal:  R knee with swelling  throughout, more so superior to patella with generalized tenderness. Warm to touch. ROM limited by pain. No skin color change noted.  Neurological: He is alert and oriented to person, place, and time.  Skin: Skin is warm and dry.  Psychiatric: He has a normal mood and affect. His behavior is normal.  Nursing note and vitals reviewed.   ED Course  Procedures (including critical care time) Labs Review Labs Reviewed - No data to display  Imaging Review No results found. I have personally reviewed and evaluated these images and lab  results as part of my medical decision-making.   EKG Interpretation None      MDM   Final diagnoses:  Acute gout of right knee, unspecified cause   Nontoxic appearing, NAD. AF VSS. Neurovascularly intact distally. History of gout and this feels the same as per patient. Low suspicion for joint infection. Had significant improvement after sterilely shot at last ED visit. Will give a dose of IM Decadron. Rx indomethacin and Vicodin. Follow-up with PCP. Advised him if he continues to get gout flares, it may be worth discussing daily gout medication. Stable for discharge. Return precautions given. Patient states understanding of treatment care plan and is agreeable.  Kathrynn Speed, PA-C 02/03/15 1759  Elwin Mocha, MD 02/03/15 2006

## 2015-12-12 ENCOUNTER — Emergency Department (HOSPITAL_BASED_OUTPATIENT_CLINIC_OR_DEPARTMENT_OTHER)
Admission: EM | Admit: 2015-12-12 | Discharge: 2015-12-12 | Disposition: A | Discharge status: Home / Self Care | Payer: BLUE CROSS/BLUE SHIELD | Attending: Physician Assistant | Admitting: Physician Assistant

## 2015-12-12 ENCOUNTER — Encounter (HOSPITAL_BASED_OUTPATIENT_CLINIC_OR_DEPARTMENT_OTHER): Payer: Self-pay | Admitting: *Deleted

## 2015-12-12 DIAGNOSIS — Z79899 Other long term (current) drug therapy: Secondary | ICD-10-CM | POA: Diagnosis not present

## 2015-12-12 DIAGNOSIS — Z87891 Personal history of nicotine dependence: Secondary | ICD-10-CM | POA: Insufficient documentation

## 2015-12-12 DIAGNOSIS — M109 Gout, unspecified: Secondary | ICD-10-CM | POA: Insufficient documentation

## 2015-12-12 DIAGNOSIS — M25562 Pain in left knee: Secondary | ICD-10-CM | POA: Diagnosis present

## 2015-12-12 MED ORDER — INDOMETHACIN 25 MG PO CAPS: 25.0000 mg | ORAL_CAPSULE | Freq: Three times a day (TID) | ORAL | Status: DC | PRN

## 2015-12-12 MED ORDER — DEXAMETHASONE SODIUM PHOSPHATE 10 MG/ML IJ SOLN
10.0000 mg | Freq: Once | INTRAMUSCULAR | Status: AC
  Administered 2015-12-12: 10 mg via INTRAMUSCULAR
  Filled 2015-12-12: qty 1

## 2015-12-12 NOTE — Discharge Instructions (Signed)

## 2015-12-12 NOTE — ED Provider Notes (Signed)
CSN: [id number]     Arrival date & time 12/12/15  0757 History   First MD Initiated Contact with Patient 12/12/15 [phone]     Chief Complaint  Patient presents with  . Knee Pain     (Consider location/radiation/quality/duration/timing/severity/associated sxs/prior Treatment) HPI   Patient is a very pleasant 55 year old male presenting with left knee pain. Patient's had multiple courses of gout in the past. Patient states this feels exactly similar. No trauma. No fevers.  Requesting shot of steroids like he got last time he came here.  Past Medical History  Diagnosis Date  . Arthritis     right hip and back with pain down right leg   Past Surgical History  Procedure Laterality Date  . Fracture surgery      repair right elbow years ago- no retained hardware  . 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 Social History  Substance Use Topics  . Smoking status: Former Smoker -- 2.00 packs/day for 10 years    Quit date: 04/27/1998  . Smokeless tobacco: Never Used  . Alcohol Use: 3.6 oz/week    6 Cans of beer per week    Review of Systems  Constitutional: Negative for activity change.  Respiratory: Negative for shortness of breath.   Cardiovascular: Negative for chest pain.  Gastrointestinal: Negative for abdominal pain.  Musculoskeletal: Positive for joint swelling and arthralgias.  All other systems reviewed and are negative.     Allergies  Review of patient's allergies indicates no known allergies.  Home Medications   Prior to Admission medications   Medication Sig Start Date End Date Taking? Authorizing Provider  geriatric multivitamins-minerals (ELDERTONIC/GEVRABON) ELIX Take 15 mLs by mouth daily.   Yes Historical Provider, MD  HYDROcodone-acetaminophen (NORCO/VICODIN) 5-325 MG per tablet Take  1-2 tablets by mouth every 4 (four) hours as needed. 02/03/15  Yes Robyn M Hess, PA-C  indomethacin (INDOCIN) 25 MG capsule Take 1 capsule (25 mg total) by mouth 3 (three) times daily as needed. 02/03/15  Yes Robyn M Hess, PA-C   BP 140/101 mmHg  Pulse 70  Temp(Src) 98.7 F (37.1 C) (Oral)  Resp 18  Ht 6\' 1"  (1.854 m)  Wt 194 lb (87.998 kg)  BMI 25.60 kg/m2  SpO2 99% Physical Exam  Constitutional: He is oriented to person, place, and time. He appears well-nourished.  HENT:  Head: Normocephalic.  Eyes: Conjunctivae are normal.  Cardiovascular: Normal rate.   Pulmonary/Chest: Effort normal.  Musculoskeletal:  Full range motion of left knee. No erythema or swelling noted.  Neurological: He is oriented to person, place, and time.  Skin: Skin is warm and dry. He is not diaphoretic.  Psychiatric: He has a normal mood and affect. His behavior is normal.    ED Course  Procedures (including critical care time) Labs Review Labs Reviewed - No data to display  Imaging Review No results found. I have personally reviewed and evaluated these images and lab results as part of my medical decision-making.   EKG Interpretation None      MDM   Final diagnoses:  None    Patient here with a gout flare. 55 year old male who ate shrimp and is now had increasing pain in his left knee. Here for steroid shot.  We'll give him shots, and indomethacin and follow up with PCP.    Naara Kelty  Randall An, MD 12/12/15 [phone]

## 2015-12-12 NOTE — ED Notes (Signed)
C/o feels like gout in left knee with hx of same.

## 2016-02-19 ENCOUNTER — Emergency Department (HOSPITAL_BASED_OUTPATIENT_CLINIC_OR_DEPARTMENT_OTHER): Payer: BLUE CROSS/BLUE SHIELD

## 2016-02-19 ENCOUNTER — Emergency Department (HOSPITAL_BASED_OUTPATIENT_CLINIC_OR_DEPARTMENT_OTHER)
Admission: EM | Admit: 2016-02-19 | Discharge: 2016-02-19 | Disposition: A | Discharge status: Home / Self Care | Payer: BLUE CROSS/BLUE SHIELD | Attending: Emergency Medicine | Admitting: Emergency Medicine

## 2016-02-19 ENCOUNTER — Encounter (HOSPITAL_BASED_OUTPATIENT_CLINIC_OR_DEPARTMENT_OTHER): Payer: Self-pay

## 2016-02-19 DIAGNOSIS — Y999 Unspecified external cause status: Secondary | ICD-10-CM | POA: Diagnosis not present

## 2016-02-19 DIAGNOSIS — S40012A Contusion of left shoulder, initial encounter: Secondary | ICD-10-CM | POA: Insufficient documentation

## 2016-02-19 DIAGNOSIS — Z87891 Personal history of nicotine dependence: Secondary | ICD-10-CM | POA: Insufficient documentation

## 2016-02-19 DIAGNOSIS — Y9389 Activity, other specified: Secondary | ICD-10-CM | POA: Diagnosis not present

## 2016-02-19 DIAGNOSIS — Y92410 Unspecified street and highway as the place of occurrence of the external cause: Secondary | ICD-10-CM | POA: Diagnosis not present

## 2016-02-19 DIAGNOSIS — S4992XA Unspecified injury of left shoulder and upper arm, initial encounter: Secondary | ICD-10-CM | POA: Diagnosis present

## 2016-02-19 HISTORY — DX: Gout, unspecified: M10.9

## 2016-02-19 MED ORDER — HYDROCODONE-ACETAMINOPHEN 5-325 MG PO TABS: 1.0000 | ORAL_TABLET | Freq: Four times a day (QID) | ORAL | 0 refills | Status: DC | PRN

## 2016-02-19 MED ORDER — HYDROCODONE-ACETAMINOPHEN 5-325 MG PO TABS
1.0000 | ORAL_TABLET | Freq: Once | ORAL | Status: AC
  Administered 2016-02-19: 1 via ORAL
  Filled 2016-02-19: qty 1
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
## 2016-04-22 ENCOUNTER — Emergency Department (HOSPITAL_BASED_OUTPATIENT_CLINIC_OR_DEPARTMENT_OTHER)
Admission: EM | Admit: 2016-04-22 | Discharge: 2016-04-22 | Disposition: A | Discharge status: Home / Self Care | Payer: BLUE CROSS/BLUE SHIELD | Attending: Emergency Medicine | Admitting: Emergency Medicine

## 2016-04-22 ENCOUNTER — Encounter (HOSPITAL_BASED_OUTPATIENT_CLINIC_OR_DEPARTMENT_OTHER): Payer: Self-pay | Admitting: *Deleted

## 2016-04-22 DIAGNOSIS — R600 Localized edema: Secondary | ICD-10-CM | POA: Insufficient documentation

## 2016-04-22 DIAGNOSIS — Z87891 Personal history of nicotine dependence: Secondary | ICD-10-CM | POA: Insufficient documentation

## 2016-04-22 DIAGNOSIS — M25561 Pain in right knee: Secondary | ICD-10-CM | POA: Diagnosis present

## 2016-04-22 MED ORDER — HYDROCODONE-ACETAMINOPHEN 5-325 MG PO TABS: 1.0000 | ORAL_TABLET | Freq: Four times a day (QID) | ORAL | 0 refills | Status: DC | PRN

## 2016-04-22 MED ORDER — PREDNISONE 20 MG PO TABS: ORAL_TABLET | ORAL | 0 refills | Status: DC

## 2016-04-22 MED ORDER — DEXAMETHASONE SODIUM PHOSPHATE 10 MG/ML IJ SOLN
10.0000 mg | Freq: Once | INTRAMUSCULAR | Status: AC
  Administered 2016-04-22: 10 mg via INTRAMUSCULAR
  Filled 2016-04-22: qty 1

## 2016-04-22 MED FILL — predniSONE 20 MG TABS: 20 | 7 days supply | Qty: 13 | Fill #0

## 2016-04-22 MED FILL — HYDROCODON-APAP 5-325: 5-325 | 2 days supply | Qty: 10 | Fill #0

## 2016-04-22 NOTE — ED Provider Notes (Addendum)
[facility]   CSN: [id number] Arrival date & time: 04/22/16  0815     History   Chief Complaint Chief Complaint  Patient presents with  . Gout    HPI Terry Burton is a 55 y.o. male.  55 year old male with right knee pain and swelling consistent with previous episodes of gout. Says he did drink some alcohol and eat some red meat over this last weekend and this is about the time that the flare started. He does take allopurinol daily. He is followed by his doctor for this chronic issue. No fevers, nausea, vomiting, worsening pain or other symptoms that make this feel different than previous episodes of gout.      Past Medical History:  Diagnosis Date  . Arthritis    right hip and back with pain down right leg  . Gout     Patient Active Problem List   Diagnosis Date Noted  . Hip arthritis 05/01/2011    Past Surgical History:  Procedure Laterality Date  . FRACTURE SURGERY     repair right elbow years ago- no retained hardware  . TOTAL HIP ARTHROPLASTY  05/01/2011   Procedure: TOTAL HIP ARTHROPLASTY ANTERIOR APPROACH;  Surgeon: Kathryne Hitchhristopher Y Blackman;  Location: [facility];  Service: Orthopedics;  Laterality: Right;       Home Medications    Prior to Admission medications   Medication Sig Start Date End Date Taking? Authorizing Provider  ALLOPURINOL PO Take by mouth.   Yes Historical Provider, MD  geriatric multivitamins-minerals (ELDERTONIC/GEVRABON) ELIX Take 15 mLs by mouth daily.    Historical Provider, MD  HYDROcodone-acetaminophen (NORCO/VICODIN) 5-325 MG tablet Take 1-2 tablets by mouth every 6 (six) hours as needed. 04/22/16   Marily MemosJason Karlene Southard, MD  indomethacin (INDOCIN) 25 MG capsule Take 1 capsule (25 mg total) by mouth 3 (three) times daily as needed. 12/12/15   Courteney Lyn Mackuen, MD  predniSONE (DELTASONE) 20 MG tablet 3 tabs po day one, then 2 po daily x 4 days, then 1 po daily x 2 days 04/22/16   Marily MemosJason Charina Fons, MD    Family  History History reviewed. No pertinent family history.  Social History Social History  Substance Use Topics  . Smoking status: Former Smoker    Packs/day: 2.00    Years: 10.00    Quit date: 04/27/1998  . Smokeless tobacco: Never Used  . Alcohol use 3.6 oz/week    6 Cans of beer per week     Allergies   Patient has no known allergies.   Review of Systems Review of Systems  Musculoskeletal:       Right knee pain and swelling   All other systems reviewed and are negative.    Physical Exam Updated Vital Signs BP 144/96 (BP Location: Right Arm)   Pulse 72   Temp 98.2 F (36.8 C) (Oral)   Resp 18   Ht 6' (1.829 m)   Wt 195 lb (88.5 kg)   SpO2 100%   BMI 26.45 kg/m   Physical Exam  Constitutional: He appears well-developed and well-nourished.  HENT:  Head: Normocephalic and atraumatic.  Neck: Normal range of motion.  Cardiovascular: Normal rate.   Pulmonary/Chest: Effort normal. No respiratory distress.  Abdominal: He exhibits no distension.  Musculoskeletal: Normal range of motion. He exhibits edema (right knee with some edema/effusion as well. slightly warm but no erythema, induration or tenderness.). He exhibits no tenderness or deformity.  Neurological: He is alert.  Nursing note and vitals reviewed.  ED Treatments / Results  Labs (all labs ordered are listed, but only abnormal results are displayed) Labs Reviewed - No data to display  EKG  EKG Interpretation None       Radiology No results found.  Procedures Procedures (including critical care time)  Medications Ordered in ED Medications  dexamethasone (DECADRON) injection 10 mg (10 mg Intramuscular Given 04/22/16 0851)     Initial Impression / Assessment and Plan / ED Course  I have reviewed the triage vital signs and the nursing notes.  Pertinent labs & imaging results that were available during my care of the patient were reviewed by me and considered in my medical decision making  (see chart for details).  Clinical Course    Likely gout flare. No e/o septic knee. No trauma to suggest injury from that mechanism. Will give steroids and short course of vicodin. Already on allopurinol and indomethacin athome. Plan for dc to follow up with PCP for btter control of gout.    Final Clinical Impressions(s) / ED Diagnoses   Final diagnoses:  Acute pain of right knee    New Prescriptions New Prescriptions   HYDROCODONE-ACETAMINOPHEN (NORCO/VICODIN) 5-325 MG TABLET    Take 1-2 tablets by mouth every 6 (six) hours as needed.   PREDNISONE (DELTASONE) 20 MG TABLET    3 tabs po day one, then 2 po daily x 4 days, then 1 po daily x 2 days     Marily MemosJason Merel Santoli, MD 04/22/16 [id number]    Marily MemosJason Matheus Spiker, MD 04/22/16 [id number]

## 2016-04-22 NOTE — ED Triage Notes (Signed)
Pt amb to room 5 with slow steady gait using cane, reports his usual gout pain and swelling x 2 days.

## 2016-12-10 ENCOUNTER — Ambulatory Visit (INDEPENDENT_AMBULATORY_CARE_PROVIDER_SITE_OTHER): Payer: BLUE CROSS/BLUE SHIELD | Admitting: Orthopaedic Surgery

## 2016-12-10 ENCOUNTER — Ambulatory Visit (INDEPENDENT_AMBULATORY_CARE_PROVIDER_SITE_OTHER): Payer: BLUE CROSS/BLUE SHIELD

## 2016-12-10 DIAGNOSIS — M25511 Pain in right shoulder: Secondary | ICD-10-CM | POA: Diagnosis not present

## 2016-12-10 DIAGNOSIS — M25551 Pain in right hip: Secondary | ICD-10-CM | POA: Insufficient documentation

## 2016-12-10 MED ORDER — METHYLPREDNISOLONE 4 MG PO TABS: ORAL_TABLET | ORAL | 0 refills | Status: DC

## 2016-12-10 MED ORDER — METHOCARBAMOL 500 MG PO TABS: 500.0000 mg | ORAL_TABLET | Freq: Three times a day (TID) | ORAL | 0 refills | Status: DC | PRN

## 2016-12-10 NOTE — Progress Notes (Deleted)
Office Visit Note   Patient: Terry Burton           Date of Birth: 12/06/1960           MRN: [id number] Visit Date: 12/10/2016              Requested by: Wilburn Mylar, MD [address] [facility] [place], [place] [address] PCP: Wilburn Mylar, MD   Assessment & Plan: Visit Diagnoses:  1. Acute pain of right shoulder   2. Right hip pain     Plan: ***  Follow-Up Instructions: Return if symptoms worsen or fail to improve.   Orders:  Orders Placed This Encounter  Procedures  . XR HIP UNILAT W OR W/O PELVIS 1V RIGHT  . XR Shoulder Right   Meds ordered this encounter  Medications  . methylPREDNISolone (MEDROL) 4 MG tablet    Sig: Medrol dose pack. Take as instructed    Dispense:  21 tablet    Refill:  0  . methocarbamol (ROBAXIN) 500 MG tablet    Sig: Take 1 tablet (500 mg total) by mouth every 8 (eight) hours as needed for muscle spasms.    Dispense:  60 tablet    Refill:  0      Procedures: No procedures performed   Clinical Data: No additional findings.   Subjective: No chief complaint on file. The patient is someone well-known to me. 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 He's been doing great for last 6 years but about a week ago sustained hard mechanical fall. Bruising around his right hip as well as right shoulder pain. He someone who works out avidly and is been having problems with shoulder weakness and arm weakness he felt like he may have had a pinched nerve. He's also had some hip pain. He is walking without assistive device and is avid golfer but is taking a week off from golf. He denies any groin pain. He denies any numbness and tingling in his right hand or neck pain.  HPI  Review of Systems He currently denies any headache, chest pain, short of breath, fever, chills, nausea, vomiting  Objective: Vital Signs: There were no vitals taken  for this visit.  Physical Exam He is alert and oriented 3 and in no acute distress. He is not walking with a limp and is not using assistive device Ortho Exam Examination of his right nondominant shoulder which is the under shoulder shows fluid and full range of motion of right shoulder. He has some pain in the trapezius area but otherwise 5 out of 5 strength with shoulder abduction as wel external rotation. His impingement signs are minimal in terms of pain. His liftoff is negative. He has normal biceps and triceps function and good grip strength in his right hand.  His right hip shows full range of motion with no pain at all. His good quadriceps strength and hamstring strength. He does have some bruising on the posterior lateral thigh from his fall but clinically the hip appears well located. His leg lengths are equal. Specialty Comments:  No specialty comments available.  Imaging: Xr Hip Unilat W Or W/o Pelvis 1v Right  Result Date: 12/10/2016 An AP pelvis and lateral of his right hip shows a well-seated total hip arthroplasty with no, getting features. There is no evidence of acute findings. There is no ostial lysis or evidence  of loosening.  Xr Shoulder Right  Result Date: 12/10/2016 3 views of the right shoulder AP outlet and axillary show well located shoulder with no acute findings.    PMFS History: Patient Active Problem List   Diagnosis Date Noted  . Acute pain of right shoulder 12/10/2016  . Right hip pain 12/10/2016  . Hip arthritis 05/01/2011   Past Medical History:  Diagnosis Date  . Arthritis    right hip and back with pain down right leg  . Gout     No family history on file.  Past Surgical History:  Procedure Laterality Date  . FRACTURE SURGERY     repair right elbow years ago- no retained hardware  . TOTAL HIP ARTHROPLASTY  05/01/2011   Procedure: TOTAL HIP ARTHROPLASTY ANTERIOR APPROACH;  Surgeon: Kathryne Hitchhristopher Y Casson Catena;  Location: [facility];  Service:  Orthopedics;  Laterality: Right;   Social History   Occupational History  . Not on file.   Social History Main Topics  . Smoking status: Former Smoker    Packs/day: 2.00    Years: 10.00    Quit date: 04/27/1998  . Smokeless tobacco: Never Used  . Alcohol use 3.6 oz/week    6 Cans of beer per week  . Drug use: Yes    Types: Marijuana     Comment: in college  . Sexual activity: Not on file

## 2016-12-10 NOTE — Progress Notes (Signed)
Office Visit Note   Patient: Terry Burton           Date of Birth: 03/25/1961           MRN: [id number] Visit Date: 12/10/2016              Requested by: Wilburn MylarKelly, Samuel S, MD [address] [facility] [place] [id number] PCP: Wilburn MylarKelly, Samuel S, MD   Assessment & Plan: Visit Diagnoses:  1. Acute pain of right shoulder   2. Right hip pain     Plan: I gave him reassurance that his right shoulder and right hip are doing well. I do feel a muscle relaxant such as Robaxin as well as a six-day steroid taper and a Medrol Dosepak will do well for him to decrease his pain. Meloxicam is been helping as well. All questions were encouraged and answered. I'll follow up as needed.  Follow-Up Instructions: Return if symptoms worsen or fail to improve.   Orders:  Orders Placed This Encounter  Procedures  . XR HIP UNILAT W OR W/O PELVIS 1V RIGHT  . XR Shoulder Right   Meds ordered this encounter  Medications  . methylPREDNISolone (MEDROL) 4 MG tablet    Sig: Medrol dose pack. Take as instructed    Dispense:  21 tablet    Refill:  0  . methocarbamol (ROBAXIN) 500 MG tablet    Sig: Take 1 tablet (500 mg total) by mouth every 8 (eight) hours as needed for muscle spasms.    Dispense:  60 tablet    Refill:  0      Procedures: No procedures performed   Clinical Data: No additional findings.   Subjective: No chief complaint on file. The patient is someone well-known to me. We performed a right direct anterior hip replacement on now 6 years ago. He sustained a mechanical fall which is a pretty hard fall about a week ago. He injured his right nondominant shoulder and his right hip. He's been on meloxicam and it's helped some. He felt that he may have had a pinched nerve in his neck. He denies any change in bowel or bladder function or numbness and tingling in his hands. He denies any significant weakness the first he felt his right arm was week. States his hip is  doing well. He is an avid Teacher, English as a foreign languagegolfer.  HPI  Review of Systems He currently denies any headache, chest pain, short of breath, fever, chills, nausea, vomiting.  Objective: Vital Signs: There were no vitals taken for this visit.  Physical Exam He is alert and oriented 3 and in no acute distress Ortho Exam Examination of his right shoulder shows full and fluid range of motion with no deficits in motion. His rotator cuff shows 5 out of 5 strength with abduction and external rotation. He has some pain in the trapezius area but his elbow and hand function are normal. His internal rotation with adduction is full and his liftoff is negative. Examination of the right hip shows fluid range of motion of the right hip actively and passively with no pain to compression of his hip or with the extremes of motion. His leg lengths are equal. He does have some bruising around this lateral posterior thigh. Specialty Comments:  No specialty comments available.  Imaging: Xr Hip Unilat W Or W/o Pelvis 1v Right  Result Date: 12/10/2016 An AP pelvis and lateral of his right hip shows a well-seated total hip arthroplasty with no,  getting features. There is no evidence of acute findings. There is no ostial lysis or evidence of loosening.  Xr Shoulder Right  Result Date: 12/10/2016 3 views of the right shoulder AP outlet and axillary show well located shoulder with no acute findings.    PMFS History: Patient Active Problem List   Diagnosis Date Noted  . Acute pain of right shoulder 12/10/2016  . Right hip pain 12/10/2016  . Hip arthritis 05/01/2011   Past Medical History:  Diagnosis Date  . Arthritis    right hip and back with pain down right leg  . Gout     No family history on file.  Past Surgical History:  Procedure Laterality Date  . FRACTURE SURGERY     repair right elbow years ago- no retained hardware  . TOTAL HIP ARTHROPLASTY  05/01/2011   Procedure: TOTAL HIP ARTHROPLASTY ANTERIOR APPROACH;   Surgeon: Kathryne Hitch;  Location: [facility];  Service: Orthopedics;  Laterality: Right;   Social History   Occupational History  . Not on file.   Social History Main Topics  . Smoking status: Former Smoker    Packs/day: 2.00    Years: 10.00    Quit date: 04/27/1998  . Smokeless tobacco: Never Used  . Alcohol use 3.6 oz/week    6 Cans of beer per week  . Drug use: Yes    Types: Marijuana     Comment: in college  . Sexual activity: Not on file

## 2017-01-10 ENCOUNTER — Emergency Department (HOSPITAL_BASED_OUTPATIENT_CLINIC_OR_DEPARTMENT_OTHER)
Admission: EM | Admit: 2017-01-10 | Discharge: 2017-01-10 | Disposition: A | Discharge status: Home / Self Care | Payer: BLUE CROSS/BLUE SHIELD | Attending: Emergency Medicine | Admitting: Emergency Medicine

## 2017-01-10 ENCOUNTER — Encounter (HOSPITAL_BASED_OUTPATIENT_CLINIC_OR_DEPARTMENT_OTHER): Payer: Self-pay | Admitting: *Deleted

## 2017-01-10 DIAGNOSIS — R2241 Localized swelling, mass and lump, right lower limb: Secondary | ICD-10-CM | POA: Insufficient documentation

## 2017-01-10 DIAGNOSIS — Z4802 Encounter for removal of sutures: Secondary | ICD-10-CM | POA: Diagnosis not present

## 2017-01-10 DIAGNOSIS — Z87891 Personal history of nicotine dependence: Secondary | ICD-10-CM | POA: Diagnosis not present

## 2017-01-10 DIAGNOSIS — Z79899 Other long term (current) drug therapy: Secondary | ICD-10-CM | POA: Diagnosis not present

## 2017-01-10 DIAGNOSIS — M11261 Other chondrocalcinosis, right knee: Secondary | ICD-10-CM | POA: Insufficient documentation

## 2017-01-10 DIAGNOSIS — M25561 Pain in right knee: Secondary | ICD-10-CM | POA: Diagnosis present

## 2017-01-10 DIAGNOSIS — M1120 Other chondrocalcinosis, unspecified site: Secondary | ICD-10-CM

## 2017-01-10 LAB — SYNOVIAL CELL COUNT + DIFF, W/ CRYSTALS
Eosinophils-Synovial: 0 % (ref 0–1)
Lymphocytes-Synovial Fld: 2 % (ref 0–20)
MONOCYTE-MACROPHAGE-SYNOVIAL FLUID: 4 % — AB (ref 50–90)
Neutrophil, Synovial: 94 % — ABNORMAL HIGH (ref 0–25)
WBC, Synovial: 6143 /mm3 — ABNORMAL HIGH (ref 0–200)

## 2017-01-10 MED ORDER — HYDROCODONE-ACETAMINOPHEN 5-325 MG PO TABS: 1.0000 | ORAL_TABLET | ORAL | 0 refills | Status: DC | PRN

## 2017-01-10 MED ORDER — COLCHICINE 0.6 MG PO TABS: ORAL_TABLET | ORAL | 0 refills | Status: DC

## 2017-01-10 MED ORDER — LIDOCAINE-EPINEPHRINE (PF) 2 %-1:200000 IJ SOLN
10.0000 mL | Freq: Once | INTRAMUSCULAR | Status: AC
  Administered 2017-01-10: 10 mL
  Filled 2017-01-10: qty 10

## 2017-01-10 NOTE — ED Provider Notes (Signed)
[facility]-EMERGENCY DEPT Provider Note   CSN: [id number] Arrival date & time: 01/10/17  4696     History   Chief Complaint Chief Complaint  Patient presents with  . Joint Swelling    HPI Terry Burton is a 56 y.o. male.  HPI Patient had been [place] a pool [place] the beach 8 days ago. He hit his right knee on the step in the pool. He had a laceration on the knee and stitches were placed that day. He reports it was doing well and there was no swelling or drainage. 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 He reports after work and this morning it is now quite swollen. He reports there is increased pain in the joint. Still he is not seeing redness. Patient reports he also has history of gout which she has had in his knees. No fever, no chills, no general malaise. Past Medical History:  Diagnosis Date  . Arthritis    right hip and back with pain down right leg  . Gout     Patient Active Problem List   Diagnosis Date Noted  . Acute pain of right shoulder 12/10/2016  . Right hip pain 12/10/2016  . Hip arthritis 05/01/2011    Past Surgical History:  Procedure Laterality Date  . FRACTURE SURGERY     repair right elbow years ago- no retained hardware  . TOTAL HIP ARTHROPLASTY  05/01/2011   Procedure: TOTAL HIP ARTHROPLASTY ANTERIOR APPROACH;  Surgeon: Kathryne Hitch;  Location: [facility];  Service: Orthopedics;  Laterality: Right;       Home Medications    Prior to Admission medications   Medication Sig Start Date End Date Taking? Authorizing Provider  ALLOPURINOL PO Take by mouth.   Yes [provider]  geriatric multivitamins-minerals (ELDERTONIC/GEVRABON) ELIX Take 15 mLs by mouth daily.   Yes [provider]  indomethacin (INDOCIN) 25 MG capsule Take 1 capsule (25 mg total) by mouth 3 (three) times daily as needed. 12/12/15  Yes Mackuen, Courteney Lyn, MD  methocarbamol (ROBAXIN) 500 MG tablet Take 1  tablet (500 mg total) by mouth every 8 (eight) hours as needed for muscle spasms. 12/10/16  Yes Kathryne Hitch, MD  colchicine 0.6 MG tablet Take 1 tablet up to every 8 hours (3 times maximum ) on the first day prescribed. On the second day may take 1 tablet twice a day. May continue 1 tablet twice a day until pain is significantly improved. Then you may take 1 tablet daily. 01/10/17   Arby Barrette, MD  HYDROcodone-acetaminophen (NORCO/VICODIN) 5-325 MG tablet Take 1-2 tablets by mouth every 6 (six) hours as needed. 04/22/16   Mesner, Barbara Cower, MD  HYDROcodone-acetaminophen (NORCO/VICODIN) 5-325 MG tablet Take 1-2 tablets by mouth every 4 (four) hours as needed for moderate pain or severe pain. 01/10/17   Arby Barrette, MD  methylPREDNISolone (MEDROL) 4 MG tablet Medrol dose pack. Take as instructed 12/10/16   Kathryne Hitch, MD  predniSONE (DELTASONE) 20 MG tablet 3 tabs po day one, then 2 po daily x 4 days, then 1 po daily x 2 days 04/22/16   Mesner, Barbara Cower, MD    Family History No family history on file.  Social History Social History  Substance Use Topics  . Smoking status: Former Smoker    Packs/day: 2.00    Years: 10.00    Quit date: 04/27/1998  . Smokeless tobacco: Never Used  .  Alcohol use 3.6 oz/week    6 Cans of beer per week     Comment: occ     Allergies   Patient has no known allergies.   Review of Systems Review of Systems 10 Systems reviewed and are negative for acute change except as noted in the HPI.   Physical Exam Updated Vital Signs BP 121/88 (BP Location: Right Arm)   Pulse 68   Temp 98.5 F (36.9 C) (Oral)   Resp 14   Ht 6\' 1"  (1.854 m)   Wt 89.8 kg (198 lb)   SpO2 100%   BMI 26.12 kg/m   Physical Exam  Constitutional: He is oriented to person, place, and time. He appears well-developed and well-nourished. No distress.  HENT:  Head: Normocephalic and atraumatic.  Eyes: EOM are normal.  Pulmonary/Chest: Effort normal.    Abdominal: He exhibits no distension.  Musculoskeletal:  Patient has moderate joint effusion of the right knee. Stitches on the front of the knee. The wound is clean dry and intact and appears to be healing well. No significant erythema. Patient does have pain with range of motion and patellar movement. No edema of the lower leg. No calf tenderness. No edema of the foot.  Neurological: He is alert and oriented to person, place, and time. No cranial nerve deficit. He exhibits normal muscle tone. Coordination normal.  Skin: Skin is warm and dry.  Psychiatric: He has a normal mood and affect.           ED Treatments / Results  Labs (all labs ordered are listed, but only abnormal results are displayed) Labs Reviewed  SYNOVIAL CELL COUNT + DIFF, W/ CRYSTALS - Abnormal; Notable for the following:       Result Value   Appearance-Synovial TURBID (*)    WBC, Synovial 6,143 (*)    Neutrophil, Synovial 94 (*)    Monocyte-Macrophage-Synovial Fluid 4 (*)    All other components within normal limits  CULTURE, BODY FLUID-BOTTLE  GRAM STAIN    EKG  EKG Interpretation None       Radiology No results found.  Procedures .Joint Aspiration/Arthrocentesis Date/Time: 01/10/2017 9:36 AM Performed by: Arby BarrettePFEIFFER, Cherye Gaertner Authorized by: Arby BarrettePFEIFFER, Maleik Vanderzee   Consent:    Consent obtained:  Verbal   Consent given by:  Patient   Risks discussed:  Bleeding, infection, pain and incomplete drainage   Alternatives discussed:  Alternative treatment and delayed treatment Location:    Location:  Knee   Knee:  R knee Anesthesia (see MAR for exact dosages):    Anesthesia method:  Local infiltration   Local anesthetic:  Lidocaine 2% WITH epi Procedure details:    Preparation: Patient was prepped and draped in usual sterile fashion     Needle gauge:  18 G   Ultrasound guidance: no     Approach:  Medial   Aspirate amount:  55   Aspirate characteristics:  Serous   Steroid injected: no     Specimen  collected: yes   Post-procedure details:    Dressing:  Adhesive bandage   Patient tolerance of procedure:  Tolerated well, no immediate complications Comments:     Straw-colored clear serous fluid obtained. No appearance of purulence or cloudiness. No blood.  EMERGENCY DEPARTMENT US SOFT TISSUE INTERPRETATION "Study: Limited Soft Tissue Ultrasound"  INDICATIONS: Right knee swelling with overlying sutured wound. Multiple views of the body part were obtained in real-time with a multi-frequency linear probe  PERFORMED BY: Dr. Arby BarretteMarcy Princes Finger IMAGES ARCHIVED?: No SIDE: Right  knee BODY PART: Right knee INTERPRETATION: Moderate to large joint effusion without any focal fluid collection in the prepatellar space below the wound.    Medications Ordered in ED Medications  lidocaine-EPINEPHrine (XYLOCAINE W/EPI) 2 %-1:200000 (PF) injection 10 mL (10 mLs Infiltration Given by Other 01/10/17 1610)     Initial Impression / Assessment and Plan / ED Course  I have reviewed the triage vital signs and the nursing notes.  Pertinent labs & imaging results that were available during my care of the patient were reviewed by me and considered in my medical decision making (see chart for details).      Final Clinical Impressions(s) / ED Diagnoses   Final diagnoses:  Pseudogout  Encounter for removal of sutures  Patient presents with onset of knee swelling about a week after suture placement. Patient has history of gout. At this time, concerned as to differentiate septic joint from incidental gout flare with recent sutures. Sutures are in good condition with wound healing without signs of evident infection. I did use ultrasound to confirm that there is no focal fluid pocket in the prepatellar bursa. Ultrasound did show large joint effusion. Joint is not red or hot to be highly suggestive of infection. I did proceed with a joint aspiration and gram stain and white blood cells were not suggestive of septic  effusion. specimen positive for calcium pyrophosphate crystals. At this time, patient will be treated with colchicine and Vicodin. Patient does have known history of gout. At this time however crystalline analysis suggests pseudogout. Patient is counseled on following up with orthopedics. He is counseled on recheck in the emergency department for increased swelling and pain.  New Prescriptions Discharge Medication List as of 01/10/2017  1:26 PM    START taking these medications   Details  colchicine 0.6 MG tablet Take 1 tablet up to every 8 hours (3 times maximum ) on the first day prescribed. On the second day may take 1 tablet twice a day. May continue 1 tablet twice a day until pain is significantly improved. Then you may take 1 tablet daily., Print    !! HYDROcodone-acetaminophen (NORCO/VICODIN) 5-325 MG tablet Take 1-2 tablets by mouth every 4 (four) hours as needed for moderate pain or severe pain., Starting Sun 01/10/2017, Print     !! - Potential duplicate medications found. Please discuss with provider.       Arby Barrette, MD 01/13/17 [phone]

## 2017-01-10 NOTE — ED Notes (Signed)
Pt educated about not driving or performing critical tasks (such as operating heavy machinery or caring for an infant/toddler/child) while taking narcotics. Pt also educated about addicting properties of narcotics and not to mix with alcohol or other sedating drugs (such as muscle relaxers and Benadryl). Pt verbalized understanding.

## 2017-01-10 NOTE — ED Notes (Signed)
ED Provider at bedside. 

## 2017-01-10 NOTE — ED Notes (Signed)
Pt refused wheelchair.  

## 2017-01-10 NOTE — ED Triage Notes (Signed)
Pt presents with R knee swelling that began yesterday. Reports injury to same knee 1 wk ago and has stitches present. Denies fever. Able to ambulate with limp.

## 2017-01-10 NOTE — ED Provider Notes (Signed)
SUTURE REMOVAL Performed by: Lyndel SafeElizabeth Melonee Gerstel  Consent: Verbal consent obtained. Consent given by: patient, discussed risks include infection, pain, wound dehiscence.  Required items: devices, and special equipment available Time out: Immediately prior to procedure a "time out" was called to verify the correct patient, procedure, equipment,and site/side marked as required.  Location: left knee  Wound Appearance: clean  Sutures/Staples Removed: 5 sutures removed, wound clean, dry with no drainage, well healed.  3 steri-strips placed for support.   Patient tolerance: Patient tolerated the procedure well with no immediate complications.      Cristina GongHammond, Remberto Lienhard W, PA-C 01/10/17 1056    Arby BarrettePfeiffer, Marcy, MD 01/13/17 [phone]

## 2017-01-10 NOTE — Discharge Instructions (Signed)
Call Dr. Eliberto IvoryBlackman's office first thing in the morning to schedule a recheck within 1-2 days  Return to the emergency department if swelling or redness worsens.  Try to keep your knee elevated and keep the weight off of it until swelling and pain have improved. Wear an Ace wrap when you are going to be up and walking.

## 2017-01-12 ENCOUNTER — Encounter (HOSPITAL_BASED_OUTPATIENT_CLINIC_OR_DEPARTMENT_OTHER): Payer: Self-pay | Admitting: *Deleted

## 2017-01-12 ENCOUNTER — Emergency Department (HOSPITAL_BASED_OUTPATIENT_CLINIC_OR_DEPARTMENT_OTHER)
Admission: EM | Admit: 2017-01-12 | Discharge: 2017-01-12 | Disposition: A | Discharge status: Home / Self Care | Payer: BLUE CROSS/BLUE SHIELD | Attending: Emergency Medicine | Admitting: Emergency Medicine

## 2017-01-12 DIAGNOSIS — M11261 Other chondrocalcinosis, right knee: Secondary | ICD-10-CM

## 2017-01-12 DIAGNOSIS — Z79899 Other long term (current) drug therapy: Secondary | ICD-10-CM | POA: Insufficient documentation

## 2017-01-12 LAB — GRAM STAIN

## 2017-01-12 MED ORDER — TRIAMCINOLONE ACETONIDE 40 MG/ML IJ SUSP
40.0000 mg | Freq: Once | INTRAMUSCULAR | Status: AC
  Administered 2017-01-12: 40 mg via INTRA_ARTICULAR
  Filled 2017-01-12: qty 5

## 2017-01-12 MED ORDER — LIDOCAINE HCL 1 % IJ SOLN
INTRAMUSCULAR | Status: AC
  Administered 2017-01-12: 10 mL
  Filled 2017-01-12: qty 10

## 2017-01-12 MED ORDER — LIDOCAINE HCL (PF) 1 % IJ SOLN: 2.0000 mL | Freq: Once | INTRAMUSCULAR | Status: DC

## 2017-01-12 NOTE — Discharge Instructions (Signed)
You have been treated today for further management of pseudogout. Continue to take the colchicine as this medication can help treat, but also prevent, pseudogout flares. May use medication such as ibuprofen or naproxen for pain. May use the previously prescribed Vicodin for severe pain. Do not drive or perform other dangerous activities while taking the Vicodin. Follow-up with the orthopedic specialist as soon as possible in this manner.

## 2017-01-12 NOTE — ED Provider Notes (Signed)
[facility]   CSN: [id number] Arrival date & time: 01/12/17  1131     History   Chief Complaint No chief complaint on file.   HPI Terry Burton is a 56 y.o. male.  HPI   Terry Burton is a 56 y.o. male, with a history of Arthritis and gout, presenting to the ED with right knee pain and swelling beginning 01/10/17. 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 Pain is 10/10, aching, seems to be localized to the back of the knee and radiates around the knee. He is requesting an IM steroid injection.  States he cut his right knee on a swimming pool at beach on [place]4 and subsequently had stitches placed.  States he thinks this feels like his gout and he typically has his flares in the right knee. [facility] for the same complaint. Underwent arthrocentesis with 55 mL of fluid withdrawn, diagnosed with pseudogout, and placed on colchicine. His orthopedist is Dr. Magnus IvanBlackman.   Denies numbness, weakness, fever/chills, falls, SOB, CP, or any other complaints.     Past Medical History:  Diagnosis Date  . Arthritis    right hip and back with pain down right leg  . Gout     Patient Active Problem List   Diagnosis Date Noted  . Acute pain of right shoulder 12/10/2016  . Right hip pain 12/10/2016  . Hip arthritis 05/01/2011    Past Surgical History:  Procedure Laterality Date  . FRACTURE SURGERY     repair right elbow years ago- no retained hardware  . TOTAL HIP ARTHROPLASTY  05/01/2011   Procedure: TOTAL HIP ARTHROPLASTY ANTERIOR APPROACH;  Surgeon: Kathryne Hitchhristopher Y Blackman;  Location: [facility];  Service: Orthopedics;  Laterality: Right;       Home Medications    Prior to Admission medications   Medication Sig Start Date End Date Taking? Authorizing Provider  ALLOPURINOL PO Take by mouth.    [provider]  colchicine 0.6 MG tablet Take 1 tablet up to every 8 hours (3 times maximum ) on the first day  prescribed. On the second day may take 1 tablet twice a day. May continue 1 tablet twice a day until pain is significantly improved. Then you may take 1 tablet daily. 01/10/17   Arby BarrettePfeiffer, Marcy, MD  geriatric multivitamins-minerals (ELDERTONIC/GEVRABON) ELIX Take 15 mLs by mouth daily.    [provider]  HYDROcodone-acetaminophen (NORCO/VICODIN) 5-325 MG tablet Take 1-2 tablets by mouth every 6 (six) hours as needed. 04/22/16   Mesner, Barbara CowerJason, MD  HYDROcodone-acetaminophen (NORCO/VICODIN) 5-325 MG tablet Take 1-2 tablets by mouth every 4 (four) hours as needed for moderate pain or severe pain. 01/10/17   Arby BarrettePfeiffer, Marcy, MD  indomethacin (INDOCIN) 25 MG capsule Take 1 capsule (25 mg total) by mouth 3 (three) times daily as needed. 12/12/15   Mackuen, Courteney Lyn, MD  methocarbamol (ROBAXIN) 500 MG tablet Take 1 tablet (500 mg total) by mouth every 8 (eight) hours as needed for muscle spasms. 12/10/16   Kathryne HitchBlackman, Christopher Y, MD  methylPREDNISolone (MEDROL) 4 MG tablet Medrol dose pack. Take as instructed 12/10/16   Kathryne HitchBlackman, Christopher Y, MD  predniSONE (DELTASONE) 20 MG tablet 3 tabs po day one, then 2 po daily x 4 days, then 1 po daily x 2 days 04/22/16   Mesner, Barbara CowerJason, MD    Family History No family history on file.  Social History Social History  Substance  Use Topics  . Smoking status: Former Smoker    Packs/day: 2.00    Years: 10.00    Quit date: 04/27/1998  . Smokeless tobacco: Never Used  . Alcohol use 3.6 oz/week    6 Cans of beer per week     Comment: occ     Allergies   Patient has no known allergies.   Review of Systems Review of Systems  Constitutional: Negative for chills and fever.  Respiratory: Negative for shortness of breath.   Cardiovascular: Negative for chest pain.  Gastrointestinal: Negative for nausea and vomiting.  Musculoskeletal: Positive for arthralgias and joint swelling.  Skin: Negative for wound.  Neurological: Negative for weakness and  numbness.  All other systems reviewed and are negative.    Physical Exam Updated Vital Signs BP 126/89   Pulse 77   Temp 98.3 F (36.8 C) (Oral)   Resp 16   Ht 6\' 1"  (1.854 m)   Wt 89.8 kg (198 lb)   SpO2 100%   BMI 26.12 kg/m   Physical Exam  Constitutional: He appears well-developed and well-nourished. No distress.  HENT:  Head: Normocephalic and atraumatic.  Eyes: Conjunctivae are normal.  Neck: Neck supple.  Cardiovascular: Normal rate, regular rhythm, normal heart sounds and intact distal pulses.   Pulmonary/Chest: Effort normal and breath sounds normal. No respiratory distress.  Abdominal: Soft. There is no tenderness. There is no guarding.  Musculoskeletal: He exhibits edema and tenderness. He exhibits no deformity.  Right knee is swollen, tenderness, erythematous, and with increased warmth. Patient allows palpation and almost full flexion and extension both passively and actively. Noted effusion.  No pain, tenderness, erythema, or swelling to upper right leg or lower right leg.   Lymphadenopathy:    He has no cervical adenopathy.  Neurological: He is alert.  Skin: Skin is warm and dry. Capillary refill takes less than 2 seconds. He is not diaphoretic.  Psychiatric: He has a normal mood and affect. His behavior is normal.  Nursing note and vitals reviewed.    ED Treatments / Results  Labs (all labs ordered are listed, but only abnormal results are displayed) Labs Reviewed - No data to display  EKG  EKG Interpretation None       Radiology No results found.  Procedures .Joint Aspiration/Arthrocentesis Date/Time: 01/12/2017 1:40 PM Performed by: Anselm Pancoast Authorized by: Anselm Pancoast   Consent:    Consent obtained:  Verbal   Consent given by:  Patient   Risks discussed:  Bleeding, incomplete drainage, infection and pain   Alternatives discussed:  No treatment Location:    Location:  Knee   Knee:  R knee Anesthesia (see MAR for exact dosages):      Anesthesia method:  Local infiltration   Local anesthetic:  Lidocaine 1% w/o epi Procedure details:    Preparation: Patient was prepped and draped in usual sterile fashion     Needle gauge:  18 G   Ultrasound guidance: no     Approach:  Lateral   Aspirate amount:  72   Aspirate characteristics:  Cloudy and yellow   Steroid injected: yes     Specimen collected: no   Post-procedure details:    Dressing:  Sterile dressing   Patient tolerance of procedure:  Tolerated well, no immediate complications Comments:     Ace bandage wrapped around the knee for compression.   Injection of joint Date/Time: 01/12/2017 1:45 PM Performed by: Anselm Pancoast Authorized by: Anselm Pancoast  Consent:  Verbal consent obtained. Risks and benefits: risks, benefits and alternatives were discussed Consent given by: patient Patient understanding: patient states understanding of the procedure being performed Patient consent: the patient's understanding of the procedure matches consent given Procedure consent: procedure consent matches procedure scheduled Patient identity confirmed: verbally with patient Time out: Immediately prior to procedure a "time out" was called to verify the correct patient, procedure, equipment, support staff and site/side marked as required. Preparation: Patient was prepped and draped in the usual sterile fashion. Local anesthesia used: yes  Anesthesia: Local anesthesia used: yes Local Anesthetic: lidocaine 1% without epinephrine  Sedation: Patient sedated: no Patient tolerance: Patient tolerated the procedure well with no immediate complications Comments: Injected with 2 mL of 1% lidocaine without epinephrine as well as 40 mg of Kenalog.    (including critical care time)  Medications Ordered in ED Medications  lidocaine (PF) (XYLOCAINE) 1 % injection 2 mL (2 mLs Other Not Given 01/12/17 1321)  triamcinolone acetonide (KENALOG-40) injection 40 mg (40 mg Intra-articular Given  by Other 01/12/17 1321)  lidocaine (XYLOCAINE) 1 % (with pres) injection (10 mLs  Given by Other 01/12/17 1321)     Initial Impression / Assessment and Plan / ED Course  I have reviewed the triage vital signs and the nursing notes.  Pertinent labs & imaging results that were available during my care of the patient were reviewed by me and considered in my medical decision making (see chart for details).      Patient presents with painful right knee. Previously diagnosed with pseudogout. Patient's presentation today is consistent with pseudogout. Based on previous arthrocentesis synovial fluid assessment, doubt septic arthritis. Patient voiced immediate relief following joint aspiration and subsequent injection. Orthopedic follow-up. Patient to continue the colchicine, as previously prescribed. The patient was given instructions for home care as well as return precautions. Patient voices understanding of these instructions, accepts the plan, and is comfortable with discharge.   Treatment for pseudogout is intrarticular joint injection. NSAIDs and colchicine.    Vitals:   01/12/17 1135 01/12/17 1137 01/12/17 1423  BP:  126/89 (!) 140/100  Pulse:  77 72  Resp:  16 14  Temp:  98.3 F (36.8 C)   TempSrc:  Oral   SpO2:  100% 99%  Weight: 89.8 kg (198 lb)    Height: 6\' 1"  (1.854 m)       Final Clinical Impressions(s) / ED Diagnoses   Final diagnoses:  Pseudogout of knee, right    New Prescriptions Discharge Medication List as of 01/12/2017  2:00 PM       Anselm Pancoast, PA-C 01/12/17 1722    Doug Sou, MD 01/14/17 0025

## 2017-01-12 NOTE — ED Notes (Signed)
ED Provider at bedside. 

## 2017-01-12 NOTE — ED Triage Notes (Signed)
Recheck right knee. States he was told he has gout and needs a "shot".

## 2017-01-12 NOTE — ED Provider Notes (Signed)
He continues to complain of anterior right knee pain. On exam there is a well-healed wound over the anterior knee. There is an obvious effusion. Knee is mildly tender anteriorly. DP pulse 2+.   Doug SouJacubowitz, Cornelius Schuitema, MD 01/12/17 1255

## 2017-01-13 ENCOUNTER — Encounter (INDEPENDENT_AMBULATORY_CARE_PROVIDER_SITE_OTHER): Payer: Self-pay | Admitting: Orthopaedic Surgery

## 2017-01-13 ENCOUNTER — Ambulatory Visit (INDEPENDENT_AMBULATORY_CARE_PROVIDER_SITE_OTHER): Payer: BLUE CROSS/BLUE SHIELD | Admitting: Orthopaedic Surgery

## 2017-01-13 DIAGNOSIS — M25461 Effusion, right knee: Secondary | ICD-10-CM

## 2017-01-13 MED ORDER — LIDOCAINE HCL 1 % IJ SOLN
3.0000 mL | INTRAMUSCULAR | Status: AC | PRN
  Administered 2017-01-13: 3 mL

## 2017-01-13 MED ORDER — METHYLPREDNISOLONE ACETATE 40 MG/ML IJ SUSP
40.0000 mg | INTRAMUSCULAR | Status: AC | PRN
  Administered 2017-01-13: 40 mg via INTRA_ARTICULAR

## 2017-01-13 NOTE — Progress Notes (Signed)
Office Visit Note   Patient: Terry Burton           Date of Birth: 06-Dec-1960           MRN: [id number] Visit Date: 01/13/2017              Requested by: Wilburn Mylar, MD [address] [facility] [place], [place] [address] PCP: Wilburn Mylar, MD   Assessment & Plan: Visit Diagnoses:  1. Effusion of right knee     Plan: I decided aspirated his knee again was able to withdrawal about 50 mL of fluid off the knee consistent with an arthropathy such as gout or pseudogout. I placed injection of a steroid in the knee. I would like see him back in 2 weeks to see if this is helping. If not we may end up needing to proceed with an arthroscopic intervention.  Follow-Up Instructions: Return in about 2 weeks (around 01/27/2017).   Orders:  No orders of the defined types were placed in this encounter.  No orders of the defined types were placed in this encounter.     Procedures: Large Joint Inj Date/Time: 01/13/2017 4:25 PM Performed by: Kathryne Hitch Authorized by: Kathryne Hitch   Location:  Knee Site:  R knee Ultrasound Guidance: No   Fluoroscopic Guidance: No   Arthrogram: No   Medications:  3 mL lidocaine 1 %; 40 mg methylPREDNISolone acetate 40 MG/ML     Clinical Data: No additional findings.   Subjective: Chief Complaint  Patient presents with  . Right Knee - Pain, Injury   She is well-known to me. He comes in today with acute right knee and she. He [place]. He sustained a laceration on the front of his knee is repaired [place] and he had sutures removed recently. He had acute knee swelling Sunday and yesterday went to the emergency room. Apparently they drained fluid off his knee consistent with pseudogout. He isn't following up with [place] today. He still reports HPI  Review of Systems He denies any fever, chills, nausea, vomiting,  headache, chest pain, shortness of breath.  Objective: Vital Signs: There were no vitals taken for this visit.  Physical Exam He is alert or 3 and in no acute distress Ortho Exam He is walking with a limp and is favoring his right knee. His right knee incision is healed nicely from his trauma. His range of motion is full but painful. He does have a moderate knee effusion. The knee is ligamentously stable. Specialty Comments:  No specialty comments available.  Imaging: No results found.   PMFS History: Patient Active Problem List   Diagnosis Date Noted  . Acute pain of right shoulder 12/10/2016  . Right hip pain 12/10/2016  . Hip arthritis 05/01/2011   Past Medical History:  Diagnosis Date  . Arthritis    right hip and back with pain down right leg  . Gout     No family history on file.  Past Surgical History:  Procedure Laterality Date  . FRACTURE SURGERY     repair right elbow years ago- no retained hardware  . TOTAL HIP ARTHROPLASTY  05/01/2011   Procedure: TOTAL HIP ARTHROPLASTY ANTERIOR APPROACH;  Surgeon: Kathryne Hitch;  Location: [facility];  Service: Orthopedics;  Laterality: Right;   Social History   Occupational History  . Not on  file.   Social History Main Topics  . Smoking status: Former Smoker    Packs/day: 2.00    Years: 10.00    Quit date: 04/27/1998  . Smokeless tobacco: Never Used  . Alcohol use 3.6 oz/week    6 Cans of beer per week     Comment: occ  . Drug use: No  . Sexual activity: Not on file

## 2017-01-15 LAB — CULTURE, BODY FLUID W GRAM STAIN -BOTTLE: Culture: NO GROWTH

## 2017-01-15 LAB — CULTURE, BODY FLUID-BOTTLE

## 2017-01-26 ENCOUNTER — Ambulatory Visit (INDEPENDENT_AMBULATORY_CARE_PROVIDER_SITE_OTHER): Payer: BLUE CROSS/BLUE SHIELD | Admitting: Orthopaedic Surgery

## 2017-01-26 DIAGNOSIS — M25561 Pain in right knee: Secondary | ICD-10-CM | POA: Diagnosis not present

## 2017-01-26 DIAGNOSIS — G8929 Other chronic pain: Secondary | ICD-10-CM | POA: Insufficient documentation

## 2017-01-26 NOTE — Progress Notes (Signed)
The patient returns for follow-up 2 weeks after I aspirated an effusion from his right knee. This appeared consistent with gout. I placed a steroid injection in his knee at time as well. He said things have calmed down significantly is doing very well and injection aspiration were very helpful. He does have a history of gout.  On exam his right knee has full range of motion. There is no effusion. The knee feels ligamentously stable. There is no redness or warmth either. He did play golf earlier today as well.  At this point a continued increase activities as he tolerates. He'll follow-up as needed. I counseled him about the things are needed bring him back for gout or his knee. Right total hip arthroplasty he's had no issues with it.

## 2019-02-03 ENCOUNTER — Encounter (HOSPITAL_BASED_OUTPATIENT_CLINIC_OR_DEPARTMENT_OTHER): Payer: Self-pay | Admitting: Adult Health

## 2019-02-03 ENCOUNTER — Other Ambulatory Visit: Payer: Self-pay

## 2019-02-03 ENCOUNTER — Emergency Department (HOSPITAL_BASED_OUTPATIENT_CLINIC_OR_DEPARTMENT_OTHER)
Admission: EM | Admit: 2019-02-03 | Discharge: 2019-02-03 | Disposition: A | Discharge status: Home / Self Care | Payer: BC Managed Care – PPO | Attending: Emergency Medicine | Admitting: Emergency Medicine

## 2019-02-03 DIAGNOSIS — Z87891 Personal history of nicotine dependence: Secondary | ICD-10-CM | POA: Insufficient documentation

## 2019-02-03 DIAGNOSIS — W57XXXA Bitten or stung by nonvenomous insect and other nonvenomous arthropods, initial encounter: Secondary | ICD-10-CM | POA: Insufficient documentation

## 2019-02-03 DIAGNOSIS — Y939 Activity, unspecified: Secondary | ICD-10-CM | POA: Diagnosis not present

## 2019-02-03 DIAGNOSIS — Y929 Unspecified place or not applicable: Secondary | ICD-10-CM | POA: Diagnosis not present

## 2019-02-03 DIAGNOSIS — Z96641 Presence of right artificial hip joint: Secondary | ICD-10-CM | POA: Diagnosis not present

## 2019-02-03 DIAGNOSIS — S60462A Insect bite (nonvenomous) of right middle finger, initial encounter: Secondary | ICD-10-CM | POA: Diagnosis present

## 2019-02-03 DIAGNOSIS — Y999 Unspecified external cause status: Secondary | ICD-10-CM | POA: Diagnosis not present

## 2019-02-03 DIAGNOSIS — Z79899 Other long term (current) drug therapy: Secondary | ICD-10-CM | POA: Insufficient documentation

## 2019-02-03 MED ORDER — PREDNISONE 10 MG PO TABS: 40.0000 mg | ORAL_TABLET | Freq: Every day | ORAL | 0 refills | Status: AC

## 2019-02-03 NOTE — ED Provider Notes (Signed)
[facility] EMERGENCY DEPARTMENT Provider Note   CSN: [id number] Arrival date & time: 02/03/19  1626     History   Chief Complaint Chief Complaint  Patient presents with  . Insect Bite    HPI Twanna HyDouglas J Alcantar is a 58 y.o. male. Patient is 58 year old male with no pertinent medical history presenting with right hand and forearm swelling after being stung by wasp yesterday afternoon.  Patient states there is no pain but that his arm is swollen.  Patient states swelling has moderately worsened over the past 24 hours and states he has used Benadryl without improvement.  Patient endorses constant itchiness that feels deep and constant.   Patient denies headache, dizziness, shortness of breath, chest tightness, tongue swelling, lip swelling, wheezing.  Patient denies any knowledge of allergic reaction to insect stings in the past.      HPI  Past Medical History:  Diagnosis Date  . Arthritis    right hip and back with pain down right leg  . Gout     Patient Active Problem List   Diagnosis Date Noted  . Chronic pain of right knee 01/26/2017  . Acute pain of right shoulder 12/10/2016  . Right hip pain 12/10/2016  . Hip arthritis 05/01/2011    Past Surgical History:  Procedure Laterality Date  . FRACTURE SURGERY     repair right elbow years ago- no retained hardware  . TOTAL HIP ARTHROPLASTY  05/01/2011   Procedure: TOTAL HIP ARTHROPLASTY ANTERIOR APPROACH;  Surgeon: Kathryne Hitchhristopher Y Blackman;  Location: [facility];  Service: Orthopedics;  Laterality: Right;        Home Medications    Prior to Admission medications   Medication Sig Start Date End Date Taking? Authorizing Provider  ALLOPURINOL PO Take by mouth.    [provider]  colchicine 0.6 MG tablet Take 1 tablet up to every 8 hours (3 times maximum ) on the first day prescribed. On the second day may take 1 tablet twice a day. May continue 1 tablet twice a day until pain is significantly improved. Then  you may take 1 tablet daily. 01/10/17   Arby BarrettePfeiffer, Marcy, MD  geriatric multivitamins-minerals (ELDERTONIC/GEVRABON) ELIX Take 15 mLs by mouth daily.    [provider]  HYDROcodone-acetaminophen (NORCO/VICODIN) 5-325 MG tablet Take 1-2 tablets by mouth every 6 (six) hours as needed. 04/22/16   Mesner, Barbara CowerJason, MD  HYDROcodone-acetaminophen (NORCO/VICODIN) 5-325 MG tablet Take 1-2 tablets by mouth every 4 (four) hours as needed for moderate pain or severe pain. 01/10/17   Arby BarrettePfeiffer, Marcy, MD  indomethacin (INDOCIN) 25 MG capsule Take 1 capsule (25 mg total) by mouth 3 (three) times daily as needed. 12/12/15   Mackuen, Courteney Lyn, MD  methocarbamol (ROBAXIN) 500 MG tablet Take 1 tablet (500 mg total) by mouth every 8 (eight) hours as needed for muscle spasms. 12/10/16   Kathryne HitchBlackman, Christopher Y, MD  methylPREDNISolone (MEDROL) 4 MG tablet Medrol dose pack. Take as instructed 12/10/16   Kathryne HitchBlackman, Christopher Y, MD  predniSONE (DELTASONE) 20 MG tablet 3 tabs po day one, then 2 po daily x 4 days, then 1 po daily x 2 days 04/22/16   Mesner, Barbara CowerJason, MD    Family History History reviewed. No pertinent family history.  Social History Social History   Tobacco Use  . Smoking status: Former Smoker    Packs/day: 2.00    Years: 10.00    Pack years: 20.00    Quit date: 04/27/1998    Years since quitting:  20.7  . Smokeless tobacco: Never Used  Substance Use Topics  . Alcohol use: Yes    Alcohol/week: 6.0 standard drinks    Types: 6 Cans of beer per week    Comment: occ  . Drug use: No     Allergies   Patient has no known allergies.   Review of Systems Review of Systems  Constitutional: Negative for chills and fever.  HENT: Negative for congestion, drooling and sinus pressure.   Eyes: Negative for pain.  Respiratory: Negative for shortness of breath.   Cardiovascular: Negative for chest pain.  Gastrointestinal: Negative for vomiting.  Genitourinary: Negative for dysuria.   Musculoskeletal: Negative for myalgias.  Skin: Negative for pallor.  Neurological: Negative for dizziness and headaches.     Physical Exam Updated Vital Signs BP 127/83   Pulse 75   Temp 99 F (37.2 C) (Oral)   Resp 18   Ht 6' (1.829 m)   Wt 90.9 kg   SpO2 98%   BMI 27.17 kg/m   Physical Exam Vitals signs and nursing note reviewed.  Constitutional:      General: He is not in acute distress. HENT:     Head: Normocephalic and atraumatic.     Comments: No swelling to posterior pharynx, tongue, teeth, uvula, or lips.    Nose: Nose normal.     Mouth/Throat:     Mouth: Mucous membranes are moist.     Pharynx: Oropharynx is clear.  Eyes:     General: No scleral icterus. Neck:     Musculoskeletal: Normal range of motion.  Cardiovascular:     Rate and Rhythm: Normal rate and regular rhythm.     Heart sounds: No murmur. No friction rub. No gallop.   Pulmonary:     Effort: Pulmonary effort is normal. No respiratory distress.     Breath sounds: No wheezing, rhonchi or rales.  Abdominal:     General: Bowel sounds are normal.     Palpations: Abdomen is soft.     Tenderness: There is no abdominal tenderness. There is no guarding.  Musculoskeletal:     Right lower leg: No edema.     Left lower leg: No edema.  Skin:    General: Skin is warm and dry.     Findings: No erythema.     Comments: Right middle digit with pinpoint insect bite on the lateral surface.  Moderate edema extends from hand and fingers to mid forearm on the right side.  No erythema, cellulitis, or tenderness to palpation.  Neurological:     Mental Status: He is alert. Mental status is at baseline.  Psychiatric:        Mood and Affect: Mood normal.        Behavior: Behavior normal.      ED Treatments / Results  Labs (all labs ordered are listed, but only abnormal results are displayed) Labs Reviewed - No data to display  EKG None  Radiology No results found.  Procedures Procedures (including  critical care time)  Medications Ordered in ED Medications - No data to display   Initial Impression / Assessment and Plan / ED Course  I have reviewed the triage vital signs and the nursing notes.  Pertinent labs & imaging results that were available during my care of the patient were reviewed by me and considered in my medical decision making (see chart for details).  Patient patient presents with bee sting to right hand that occurred yesterday and is still swollen after  using Benadryl.  Patient is hemodynamically stable breathing well and complains of no other symptoms.  Patient given prednisone and encouraged to use Benadryl, cool compresses and elevate leg.  Patient understands plan and is agreeable will return for concerning symptoms.        Final Clinical Impressions(s) / ED Diagnoses   Final diagnoses:  None    ED Discharge Orders    None       Gailen Shelter, [place] 02/03/19 1717    Vanetta Mulders, MD 02/09/19 [phone]

## 2019-02-03 NOTE — Discharge Instructions (Addendum)
Please return if you experience any difficulty breathing, signs of infection, or severe pain in the arm.  Please read attached information.  Use cool compresses and elevate arm when able to.  Take prednisone as prescribed.  Continue to take Benadryl--follow directions on bottle.

## 2019-02-03 NOTE — ED Notes (Signed)
Pt. Has noted noted edema in the R hand after being stung by a wasp on the R middle finger.  Pt. In no resp. Distress and reports this happened 24 hours ago.  Pt. Reports the swelling started immediately yesterday and he began taking Benadryl.  Pt. Reports itching today and swelling not going down and feels larger in the hand and wrist area.

## 2019-02-03 NOTE — ED Triage Notes (Signed)
Terry Burton was stung by a wasp yesterday at 4pm. His arm began to sting and swell. He took Benadryl 3 tablets last night and benadryl cream. Today his arm is still swollen. He has not taken any medications today. His arm is described as burning and stinging.

## 2019-12-11 ENCOUNTER — Emergency Department (HOSPITAL_BASED_OUTPATIENT_CLINIC_OR_DEPARTMENT_OTHER)
Admission: EM | Admit: 2019-12-11 | Discharge: 2019-12-11 | Disposition: A | Discharge status: Home / Self Care | Payer: BC Managed Care – PPO | Attending: Emergency Medicine | Admitting: Emergency Medicine

## 2019-12-11 ENCOUNTER — Other Ambulatory Visit: Payer: Self-pay

## 2019-12-11 ENCOUNTER — Emergency Department (HOSPITAL_BASED_OUTPATIENT_CLINIC_OR_DEPARTMENT_OTHER): Payer: BC Managed Care – PPO

## 2019-12-11 ENCOUNTER — Encounter (HOSPITAL_BASED_OUTPATIENT_CLINIC_OR_DEPARTMENT_OTHER): Payer: Self-pay | Admitting: *Deleted

## 2019-12-11 DIAGNOSIS — H938X3 Other specified disorders of ear, bilateral: Secondary | ICD-10-CM | POA: Diagnosis not present

## 2019-12-11 DIAGNOSIS — Z87891 Personal history of nicotine dependence: Secondary | ICD-10-CM | POA: Insufficient documentation

## 2019-12-11 DIAGNOSIS — M25561 Pain in right knee: Secondary | ICD-10-CM | POA: Insufficient documentation

## 2019-12-11 DIAGNOSIS — H9203 Otalgia, bilateral: Secondary | ICD-10-CM

## 2019-12-11 MED ORDER — PREDNISONE 10 MG (21) PO TBPK: ORAL_TABLET | Freq: Every day | ORAL | 0 refills | Status: DC

## 2019-12-11 NOTE — Discharge Instructions (Signed)
Take prednisone as directed.   Please follow up with your primary care provider within 5-7 days for re-evaluation of your symptoms. If you do not have a primary care provider, information for a healthcare clinic has been provided for you to make arrangements for follow up care. Please return to the emergency department for any new or worsening symptoms.  

## 2019-12-11 NOTE — ED Triage Notes (Signed)
Pain to his right knee x 3 days. Swelling. No known injury. He thinks it may be gout. He also thinks he has a bug living in his right ear. States it jumps from one ear to the other.

## 2019-12-11 NOTE — ED Provider Notes (Signed)
[facility] EMERGENCY DEPARTMENT Provider Note   CSN: [id number] Arrival date & time: 12/11/19  1209     History Chief Complaint  Patient presents with   Knee Pain    Terry Burton is a 59 y.o. male.  HPI   59 year old male with a history of arthritis, gout, who presents to the emergency department today for evaluation of right knee pain.  States he has had right knee pain and swelling for the last 5 days.  Has had similar symptoms in the past and has been diagnosed with gout.  He denies any fevers at home.  He has been able to ambulate on the leg.  States he had beer and fish prior to the onset of his symptoms.  He still concerned because he feels like he has an irritating sensation intermittently to the left ear.  He thought that a bug flew in it about a month ago.  He has the same sensation intermittently to the right ear.  He has no pain and no drainage.  Again he has no fevers.  Past Medical History:  Diagnosis Date   Arthritis    right hip and back with pain down right leg   Gout     Patient Active Problem List   Diagnosis Date Noted   Chronic pain of right knee 01/26/2017   Acute pain of right shoulder 12/10/2016   Right hip pain 12/10/2016   Hip arthritis 05/01/2011    Past Surgical History:  Procedure Laterality Date   FRACTURE SURGERY     repair right elbow years ago- no retained hardware   TOTAL HIP ARTHROPLASTY  05/01/2011   Procedure: TOTAL HIP ARTHROPLASTY ANTERIOR APPROACH;  Surgeon: Kathryne Hitch;  Location: [facility];  Service: Orthopedics;  Laterality: Right;       No family history on file.  Social History   Tobacco Use   Smoking status: Former Smoker    Packs/day: 2.00    Years: 10.00    Pack years: 20.00    Quit date: 04/27/1998    Years since quitting: 21.6   Smokeless tobacco: Never Used  Substance Use Topics   Alcohol use: Yes    Alcohol/week: 6.0 standard drinks    Types: 6 Cans of beer per week     Comment: occ   Drug use: No    Home Medications Prior to Admission medications   Medication Sig Start Date End Date Taking? Authorizing Provider  ALLOPURINOL PO Take by mouth.    [provider]  colchicine 0.6 MG tablet Take 1 tablet up to every 8 hours (3 times maximum ) on the first day prescribed. On the second day may take 1 tablet twice a day. May continue 1 tablet twice a day until pain is significantly improved. Then you may take 1 tablet daily. 01/10/17   Arby Barrette, MD  geriatric multivitamins-minerals (ELDERTONIC/GEVRABON) ELIX Take 15 mLs by mouth daily.    [provider]  HYDROcodone-acetaminophen (NORCO/VICODIN) 5-325 MG tablet Take 1-2 tablets by mouth every 6 (six) hours as needed. 04/22/16   Mesner, Barbara Cower, MD  HYDROcodone-acetaminophen (NORCO/VICODIN) 5-325 MG tablet Take 1-2 tablets by mouth every 4 (four) hours as needed for moderate pain or severe pain. 01/10/17   Arby Barrette, MD  indomethacin (INDOCIN) 25 MG capsule Take 1 capsule (25 mg total) by mouth 3 (three) times daily as needed. 12/12/15   Mackuen, Courteney Lyn, MD  methocarbamol (ROBAXIN) 500 MG tablet Take 1 tablet (500 mg total)  by mouth every 8 (eight) hours as needed for muscle spasms. 12/10/16   Kathryne Hitch, MD    Allergies    Patient has no known allergies.  Review of Systems   Review of Systems  Constitutional: Negative for fever.  HENT: Negative for ear discharge and ear pain.        Ear irritation  Musculoskeletal:       Right knee pain    Physical Exam Updated Vital Signs BP 130/88    Pulse 73    Temp 98.3 F (36.8 C) (Oral)    Resp 16    Ht 6' (1.829 m)    Wt 90.9 kg    SpO2 98%    BMI 27.18 kg/m   Physical Exam Constitutional:      General: He is not in acute distress.    Appearance: He is well-developed.  Eyes:     Conjunctiva/sclera: Conjunctivae normal.  Cardiovascular:     Rate and Rhythm: Normal rate.  Pulmonary:     Effort: Pulmonary effort  is normal.  Musculoskeletal:     Comments: TTP, swelling, and warmth to right knee. No significant erythema. ROM intact. Effusion noted on exam.   Skin:    General: Skin is warm and dry.  Neurological:     Mental Status: He is alert and oriented to person, place, and time.     ED Results / Procedures / Treatments   Labs (all labs ordered are listed, but only abnormal results are displayed) Labs Reviewed - No data to display  EKG None  Radiology DG Knee Complete 4 Views Right  Result Date: 12/11/2019 CLINICAL DATA:  Right knee pain and swelling, no known injury, diagnosed with gout 2 years ago EXAM: RIGHT KNEE - COMPLETE 4+ VIEW COMPARISON:  11/08/2014 FINDINGS: No fracture or dislocation of the right knee. Joint spaces are preserved. Moderate, nonspecific knee joint effusion. Soft tissue edema anteriorly. IMPRESSION: 1. No fracture or dislocation. 2. No evidence of gouty arthropathy. 3. Moderate, nonspecific knee joint effusion in anterior soft tissue edema. Electronically Signed   By: Lauralyn Primes M.D.   On: 12/11/2019 12:56    Procedures Procedures (including critical care time)  Medications Ordered in ED Medications - No data to display  ED Course  I have reviewed the triage vital signs and the nursing notes.  Pertinent labs & imaging results that were available during my care of the patient were reviewed by me and considered in my medical decision making (see chart for details).    MDM Rules/Calculators/A&P                          Pt presents with monoarticular pain, swelling.  Pt is afebrile and stable. Imaging reviewed, no evidence of occult fracture or injury. 1. No fracture or dislocation. 2. No evidence of gouty arthropathy. 3. Moderate, nonspecific knee joint effusion in anterior soft tissue edema.  Do not have suspicion for septic arthritis at this time.  Pt d/c with prednisone taper. Discussed that pt should respond to treatment with in 24 hour of begining  treatment & likely resolve in 2-3 days.  Additionally patient complaining of abnormal sensation to the bilateral ears he was concerned that there is a bug in his ears.  Bilateral TMs are within normal limits and canals are clear bilaterally.  No evidence of foreign body or insect in the ears and no evidence of otitis media/externa.  Advised PCP follow-up and return  precautions.  Voiced understanding plan is to return pale questions answered.  Patient stable for discharge.  Final Clinical Impression(s) / ED Diagnoses Final diagnoses:  Acute pain of right knee  Discomfort of both ears    Rx / DC Orders ED Discharge Orders    None       Karrie Meres, PA-C 12/11/19 1420    Melene Plan, DO 12/11/19 1443

## 2020-05-05 ENCOUNTER — Other Ambulatory Visit: Payer: Self-pay

## 2020-05-05 ENCOUNTER — Emergency Department (HOSPITAL_BASED_OUTPATIENT_CLINIC_OR_DEPARTMENT_OTHER): Payer: BC Managed Care – PPO

## 2020-05-05 ENCOUNTER — Encounter (HOSPITAL_BASED_OUTPATIENT_CLINIC_OR_DEPARTMENT_OTHER): Payer: Self-pay | Admitting: Emergency Medicine

## 2020-05-05 ENCOUNTER — Emergency Department (HOSPITAL_BASED_OUTPATIENT_CLINIC_OR_DEPARTMENT_OTHER)
Admission: EM | Admit: 2020-05-05 | Discharge: 2020-05-05 | Disposition: A | Discharge status: Home / Self Care | Payer: BC Managed Care – PPO | Attending: Emergency Medicine | Admitting: Emergency Medicine

## 2020-05-05 DIAGNOSIS — Z87891 Personal history of nicotine dependence: Secondary | ICD-10-CM | POA: Diagnosis not present

## 2020-05-05 DIAGNOSIS — L03113 Cellulitis of right upper limb: Secondary | ICD-10-CM | POA: Insufficient documentation

## 2020-05-05 DIAGNOSIS — M1990 Unspecified osteoarthritis, unspecified site: Secondary | ICD-10-CM | POA: Diagnosis not present

## 2020-05-05 DIAGNOSIS — Z96641 Presence of right artificial hip joint: Secondary | ICD-10-CM | POA: Insufficient documentation

## 2020-05-05 DIAGNOSIS — Z20822 Contact with and (suspected) exposure to covid-19: Secondary | ICD-10-CM | POA: Insufficient documentation

## 2020-05-05 DIAGNOSIS — M7989 Other specified soft tissue disorders: Secondary | ICD-10-CM | POA: Diagnosis present

## 2020-05-05 LAB — CBC WITH DIFFERENTIAL/PLATELET
Abs Immature Granulocytes: 0.08 10*3/uL — ABNORMAL HIGH (ref 0.00–0.07)
Basophils Absolute: 0.1 10*3/uL (ref 0.0–0.1)
Basophils Relative: 0 %
Eosinophils Absolute: 0 10*3/uL (ref 0.0–0.5)
Eosinophils Relative: 0 %
HCT: 44.2 % (ref 39.0–52.0)
Hemoglobin: 14.2 g/dL (ref 13.0–17.0)
Immature Granulocytes: 1 %
Lymphocytes Relative: 15 %
Lymphs Abs: 1.8 10*3/uL (ref 0.7–4.0)
MCH: 29.4 pg (ref 26.0–34.0)
MCHC: 32.1 g/dL (ref 30.0–36.0)
MCV: 91.5 fL (ref 80.0–100.0)
Monocytes Absolute: 1 10*3/uL (ref 0.1–1.0)
Monocytes Relative: 9 %
Neutro Abs: 8.8 10*3/uL — ABNORMAL HIGH (ref 1.7–7.7)
Neutrophils Relative %: 75 %
Platelets: 212 10*3/uL (ref 150–400)
RBC: 4.83 MIL/uL (ref 4.22–5.81)
RDW: 12.6 % (ref 11.5–15.5)
WBC: 11.8 10*3/uL — ABNORMAL HIGH (ref 4.0–10.5)
nRBC: 0 % (ref 0.0–0.2)

## 2020-05-05 LAB — BASIC METABOLIC PANEL
Anion gap: 8 (ref 5–15)
BUN: 11 mg/dL (ref 6–20)
CO2: 28 mmol/L (ref 22–32)
Calcium: 8.5 mg/dL — ABNORMAL LOW (ref 8.9–10.3)
Chloride: 96 mmol/L — ABNORMAL LOW (ref 98–111)
Creatinine, Ser: 0.99 mg/dL (ref 0.61–1.24)
GFR, Estimated: >60 mL/min (ref >60)
Glucose, Bld: 153 mg/dL — ABNORMAL HIGH (ref 70–99)
Potassium: 3.6 mmol/L (ref 3.5–5.1)
Sodium: 132 mmol/L — ABNORMAL LOW (ref 135–145)

## 2020-05-05 LAB — RESP PANEL BY RT-PCR (FLU A&B, COVID) ARPGX2
Influenza A by PCR: NEGATIVE
Influenza B by PCR: NEGATIVE
SARS Coronavirus 2 by RT PCR: NEGATIVE

## 2020-05-05 MED ORDER — METHYLPREDNISOLONE SODIUM SUCC 125 MG IJ SOLR
125.0000 mg | Freq: Once | INTRAMUSCULAR | Status: AC
  Administered 2020-05-05: 125 mg via INTRAVENOUS
  Filled 2020-05-05: qty 2

## 2020-05-05 MED ORDER — MORPHINE SULFATE (PF) 2 MG/ML IV SOLN
2.0000 mg | Freq: Once | INTRAVENOUS | Status: AC
  Administered 2020-05-05: 2 mg via INTRAVENOUS
  Filled 2020-05-05: qty 1

## 2020-05-05 MED ORDER — VANCOMYCIN HCL IN DEXTROSE 1-5 GM/200ML-% IV SOLN
1000.0000 mg | Freq: Once | INTRAVENOUS | Status: AC
  Administered 2020-05-05: 1000 mg via INTRAVENOUS
  Filled 2020-05-05: qty 200

## 2020-05-05 MED ORDER — PREDNISONE 10 MG (21) PO TBPK: ORAL_TABLET | Freq: Every day | ORAL | 0 refills | Status: DC

## 2020-05-05 MED ORDER — SODIUM CHLORIDE 0.9 % IV SOLN
INTRAVENOUS | Status: DC | PRN
  Administered 2020-05-05: 250 mL via INTRAVENOUS

## 2020-05-05 MED ORDER — SODIUM CHLORIDE 0.9 % IV SOLN
1.0000 g | Freq: Once | INTRAVENOUS | Status: AC
  Administered 2020-05-05: 1 g via INTRAVENOUS
  Filled 2020-05-05: qty 10

## 2020-05-05 MED ORDER — HYDROMORPHONE HCL 1 MG/ML IJ SOLN
0.5000 mg | Freq: Once | INTRAMUSCULAR | Status: AC
  Administered 2020-05-05: 0.5 mg via INTRAVENOUS
  Filled 2020-05-05: qty 1

## 2020-05-05 MED ORDER — DOXYCYCLINE HYCLATE 100 MG PO CAPS: 100.0000 mg | ORAL_CAPSULE | Freq: Two times a day (BID) | ORAL | 0 refills | Status: AC

## 2020-05-05 MED ORDER — VANCOMYCIN HCL IN DEXTROSE 1-5 GM/200ML-% IV SOLN: 1000.0000 mg | Freq: Two times a day (BID) | INTRAVENOUS | Status: DC

## 2020-05-05 NOTE — Progress Notes (Signed)
Pharmacy Antibiotic Note  Terry Burton is a 59 y.o. male admitted on 05/05/2020 with cellulitis.  Pharmacy has been consulted for Vancomycin dosing.   Height: 6' (182.9 cm) Weight: 88.5 kg (195 lb) IBW/kg (Calculated) : 77.6  Temp (24hrs), Avg:98.7 F (37.1 C), Min:98 F (36.7 C), Max:99.4 F (37.4 C)  Recent Labs  Lab 05/05/20 1743 05/05/20 1837  WBC 11.8*  --   CREATININE  --  0.99    Estimated Creatinine Clearance: 90.4 mL/min (by C-G formula based on SCr of 0.99 mg/dL).    No Known Allergies  Antimicrobials this admission: 12/5 Ceftriaxone >>  12/5 Vancomycin >>   Dose adjustments this admission: N/a  Microbiology results: Pending   Plan:  - No loading dose indicated for cellulitis   - Start Vancomycin 1000mg  IV q12h - Goal trough 10-15 - Monitor patients renal function and urine output  - De-escalate ABX when appropriate   Thank you for allowing pharmacy to be a part of this patient's care.  PharmD. BCPS 05/05/2020 7:32 PM

## 2020-05-05 NOTE — Progress Notes (Signed)
Reviewed case with EDP, patient with known history of gout, both in knee and hand, now with hand swelling and pain that hasn't responded to two days of treatment on IM steriod and colchicine.  No skin breaks.   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   Recommend IV steroid and vanc, and also splint, dc home with doxy and dose pack and colchicine.   FU with me tomorrow in am to recheck response.   Eulas Post, MD

## 2020-05-05 NOTE — ED Provider Notes (Signed)
[facility] EMERGENCY DEPARTMENT Provider Note   CSN: [id number] Arrival date & time: 05/05/20  1331     History Chief Complaint  Patient presents with  . Hand Swelling    Terry Burton is a 59 y.o. male who presents with concern for right hand swelling, redness, pain since Friday.  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 He has as needed prescription of allopurinol at home. He states when he has a gout flare he usually presents to his primary care doctor to receive steroid injection and colchicine prescription  Started with pain and swelling and redness in the third metacarpal phalangeal joint of the right hand Friday.  He states he presented to primary care that time and was administered injection of corticosteroids in his buttock, and was started on colchicine for gout.  He states usually this regimen relieves his symptoms significantly, however at this time his pain is getting worse.  He states that the last 48 hours he has had significantly increased swelling throughout his hand, all fingers, and up the lateral aspect of his right forearm.  Hand is warm to the touch, red, very painful.  Patient has decreased range of motion of the hand.  Additionally he endorses fever, chills at home today, anorexia today.  I personally reviewed this patient's medical records.  His history of arthritis, hip arthroplasty, recurrent gout. Patient states he took a dose of hydrocodone at home today, without relief. Patient endorses history of skin infection in his beard for which he is being treated by dermatologist with topical cream at this time.  HPI     Past Medical History:  Diagnosis Date  . Arthritis    right hip and back with pain down right leg  . Gout     Patient Active Problem List   Diagnosis Date Noted  . Chronic pain of right knee 01/26/2017  . Acute pain of right shoulder 12/10/2016  . Right hip pain 12/10/2016  . Hip  arthritis 05/01/2011    Past Surgical History:  Procedure Laterality Date  . FRACTURE SURGERY     repair right elbow years ago- no retained hardware  . TOTAL HIP ARTHROPLASTY  05/01/2011   Procedure: TOTAL HIP ARTHROPLASTY ANTERIOR APPROACH;  Surgeon: Kathryne Hitch;  Location: [facility];  Service: Orthopedics;  Laterality: Right;       History reviewed. No pertinent family history.  Social History   Tobacco Use  . Smoking status: Former Smoker    Packs/day: 2.00    Years: 10.00    Pack years: 20.00    Quit date: 04/27/1998    Years since quitting: 22.0  . Smokeless tobacco: Never Used  Substance Use Topics  . Alcohol use: Yes    Alcohol/week: 6.0 standard drinks    Types: 6 Cans of beer per week    Comment: occ  . Drug use: No    Home Medications Prior to Admission medications   Medication Sig Start Date End Date Taking? Authorizing Provider  ALLOPURINOL PO Take by mouth.    [provider]  colchicine 0.6 MG tablet Take 1 tablet up to every 8 hours (3 times maximum ) on the first day prescribed. On the second day may take 1 tablet twice a day. May continue 1 tablet twice a day until pain is significantly improved. Then you may take 1 tablet daily. 01/10/17   Arby Barrette, MD  doxycycline (VIBRAMYCIN) 100 MG capsule Take  1 capsule (100 mg total) by mouth 2 (two) times daily for 7 days. 05/05/20 05/12/20  Shakeeta Godette, Terry Gavia, PA-C  geriatric multivitamins-minerals (ELDERTONIC/GEVRABON) ELIX Take 15 mLs by mouth daily.    [provider]  HYDROcodone-acetaminophen (NORCO/VICODIN) 5-325 MG tablet Take 1-2 tablets by mouth every 6 (six) hours as needed. 04/22/16   Mesner, Barbara Cower, MD  HYDROcodone-acetaminophen (NORCO/VICODIN) 5-325 MG tablet Take 1-2 tablets by mouth every 4 (four) hours as needed for moderate pain or severe pain. 01/10/17   Arby Barrette, MD  indomethacin (INDOCIN) 25 MG capsule Take 1 capsule (25 mg total) by mouth 3 (three) times  daily as needed. 12/12/15   Mackuen, Courteney Lyn, MD  methocarbamol (ROBAXIN) 500 MG tablet Take 1 tablet (500 mg total) by mouth every 8 (eight) hours as needed for muscle spasms. 12/10/16   Kathryne Hitch, MD  predniSONE (STERAPRED UNI-PAK 21 TAB) 10 MG (21) TBPK tablet Take by mouth daily. Take 6 tabs by mouth daily  for 2 days, then 5 tabs for 2 days, then 4 tabs for 2 days, then 3 tabs for 2 days, 2 tabs for 2 days, then 1 tab by mouth daily for 2 days 05/05/20   Loleta Dicker R, PA-C    Allergies    Patient has no known allergies.  Review of Systems   Review of Systems  Constitutional: Positive for appetite change, chills and fever. Negative for activity change, diaphoresis and fatigue.  HENT: Negative.   Eyes: Negative.   Respiratory: Negative.   Cardiovascular: Negative.   Gastrointestinal: Negative.   Endocrine: Negative.   Genitourinary: Negative.   Skin: Positive for wound.       Right hand swelling, redness, pain  Neurological: Negative.  Negative for numbness.  Hematological: Negative.   Psychiatric/Behavioral: Negative.     Physical Exam Updated Vital Signs BP (!) 167/107   Pulse 87   Temp 98 F (36.7 C) (Oral)   Resp 18   Ht 6' (1.829 m)   Wt 88.5 kg   SpO2 100%   BMI 26.45 kg/m   Physical Exam Vitals and nursing note reviewed.  HENT:     Head: Normocephalic and atraumatic.     Nose: Nose normal.     Mouth/Throat:     Mouth: Mucous membranes are moist.     Pharynx: Oropharynx is clear. Uvula midline. No oropharyngeal exudate or posterior oropharyngeal erythema.     Tonsils: No tonsillar exudate.  Eyes:     General:        Right eye: No discharge.        Left eye: No discharge.     Extraocular Movements: Extraocular movements intact.     Conjunctiva/sclera: Conjunctivae normal.     Pupils: Pupils are equal, round, and reactive to light.  Neck:     Trachea: Trachea and phonation normal.  Cardiovascular:     Rate and Rhythm: Normal  rate and regular rhythm.     Pulses: Normal pulses.          Radial pulses are 2+ on the right side and 2+ on the left side.       Dorsalis pedis pulses are 2+ on the right side and 2+ on the left side.     Heart sounds: Normal heart sounds. No murmur heard.   Pulmonary:     Effort: Pulmonary effort is normal. No respiratory distress.     Breath sounds: Normal breath sounds. No wheezing or rales.  Chest:  Chest wall: No swelling, tenderness, crepitus or edema.  Abdominal:     General: Bowel sounds are normal. There is no distension.     Palpations: Abdomen is soft.     Tenderness: There is no abdominal tenderness. There is no right CVA tenderness or left CVA tenderness.  Musculoskeletal:     Right shoulder: Normal.     Left shoulder: Normal.     Right upper arm: Normal.     Left upper arm: Normal.     Right elbow: Normal.     Left elbow: Normal.     Left forearm: Normal.     Right wrist: Swelling and tenderness present. No bony tenderness or crepitus. Normal pulse.     Left wrist: Normal. Normal pulse.     Right hand: Swelling, deformity and tenderness present. Decreased strength of finger abduction. Normal sensation. Decreased capillary refill.     Left hand: Normal.       Hands:     Cervical back: Neck supple. No rigidity or crepitus. No pain with movement.     Right lower leg: No edema.     Left lower leg: No edema.     Comments: Decreased range of motion in flexion extension of digits due to swelling.  Delayed cap refill of 3 seconds in second through fourth fingers.  Normal cap refill and thumb and fifth finger.  Furthest borders of erythema/swelling marked with marking pen at 18:30.  Skin:    General: Skin is warm and dry.     Capillary Refill: Capillary refill takes less than 2 seconds.  Neurological:     General: No focal deficit present.     Mental Status: He is alert and oriented to person, place, and time.     Sensory: Sensation is intact.  Psychiatric:         Mood and Affect: Mood normal.         ED Results / Procedures / Treatments   Labs (all labs ordered are listed, but only abnormal results are displayed) Labs Reviewed  CBC WITH DIFFERENTIAL/PLATELET - Abnormal; Notable for the following components:      Result Value   WBC 11.8 (*)    Neutro Abs 8.8 (*)    Abs Immature Granulocytes 0.08 (*)    All other components within normal limits  BASIC METABOLIC PANEL - Abnormal; Notable for the following components:   Sodium 132 (*)    Chloride 96 (*)    Glucose, Bld 153 (*)    Calcium 8.5 (*)    All other components within normal limits  RESP PANEL BY RT-PCR (FLU A&B, COVID) ARPGX2    EKG None  Radiology DG Hand Complete Right  Result Date: 05/05/2020 CLINICAL DATA:  Hand pain and swelling for several days, no known injury, initial encounter EXAM: RIGHT HAND - COMPLETE 3+ VIEW COMPARISON:  None. FINDINGS: No acute fracture or dislocation is noted. Generalized soft tissue swelling is seen. IMPRESSION: Soft tissue swelling without acute bony abnormality. Electronically Signed   By: Alcide CleverMark  Lukens M.D.   On: 05/05/2020 14:41    Procedures Procedures (including critical care time)  Medications Ordered in ED Medications  0.9 %  sodium chloride infusion (250 mLs Intravenous New Bag/Given 05/05/20 1812)  vancomycin (VANCOCIN) IVPB 1000 mg/200 mL premix (has no administration in time range)  morphine 2 MG/ML injection 2 mg (2 mg Intravenous Given 05/05/20 1744)  vancomycin (VANCOCIN) IVPB 1000 mg/200 mL premix ( Intravenous Stopped 05/05/20 1915)  cefTRIAXone (ROCEPHIN)  1 g in sodium chloride 0.9 % 100 mL IVPB ( Intravenous Stopped 05/05/20 2025)  HYDROmorphone (DILAUDID) injection 0.5 mg (0.5 mg Intravenous Given 05/05/20 2029)    ED Course  I have reviewed the triage vital signs and the nursing notes.  Pertinent labs & imaging results that were available during my care of the patient were reviewed by me and considered in my medical  decision making (see chart for details).    MDM Rules/Calculators/A&P                         59 year old male who presents to the emergency department with concern for 3 days of right hand redness, swelling, pain is progressively worsened and extending up his forearm at this time.  Different diagnosis for the patient's pain includes is not limited to cellulitis, erysipelas, septic arthritis, gout, tendinitis.    Hypertensive on intake to 155/104.  Vital signs otherwise normal.  X-ray of the right hand performed in triage, soft tissue swelling without acute bony abnormality.  Physical exam concerning for large amount of swelling in the right hand, all 5 digits and ulnar aspect of forearm involved.  Redness, warmth to the touch as well, severe pain.  Less likely gout, concerning for infectious etiology at this time. Patient neurovascularly intact in all five digits.  We will proceed with basic laboratory studies, IV antibiotics with concern for cellulitis - vanc + rocephin.   Analgesia offered.  CBC with leukocytosis of 11.8. BMP with mild hyponatremia 132.  Case discussed with attending physician. Patient's presentation likely warrants admission to the hospital for further evaluation and treatment of right hand cellulitis, pending hand surgery consult.  Consult placed to hand surgeon Dr. Dion Saucier, who feels it is appropriate to discharge patient home this evening, with close follow-up in his office first thing tomorrow morning at 8:30 AM.  He states that the patient should present to his office and plan to be seen at that time.  Additionally he requests that we discharge the patient home with prescription for doxycycline and steroid taper, which I am happy to do.  I appreciate his collaboration in the care of this patient.    Mr. Bulnes voiced understanding of his medical evaluation and treatment plan.  His questions were answered to his expressed satisfaction.  Strict return precautions were  given.  Patient is stable and appropriate for discharge after completion of his IV antibiotic therapy.  Final Clinical Impression(s) / ED Diagnoses Final diagnoses:  Cellulitis of right upper extremity    Rx / DC Orders ED Discharge Orders         Ordered    predniSONE (STERAPRED UNI-PAK 21 TAB) 10 MG (21) TBPK tablet  Daily        05/05/20 1957    doxycycline (VIBRAMYCIN) 100 MG capsule  2 times daily        05/05/20 [address] Idelia Salm 05/05/20 2034    Derwood Kaplan, MD 05/06/20 1707

## 2020-05-05 NOTE — ED Triage Notes (Signed)
Pt arrives pov referred by UC with c/o right hand swelling x 3 days. Pt endorses hx of gout, states this feels different. Pt denies injury. Significant swelling noted

## 2020-05-05 NOTE — Discharge Instructions (Addendum)
You were evaluated in the department today for the redness and pain and swelling in your right hand.  Your vital signs are very reassuring, however your blood work revealed signs of infection.  X-ray did not reveal any broken bones, however revealed a large amount of swelling in your hand and wrist.  I am concerned that there is infection in your hand that is now extending up your arm.  I suspect that this started as a gout flare, that now has superimposed bacterial infection.  You were administered two different antibiotics while in the emergency department.  Additionally I spoke with hand surgeon Dr. Dion Saucier who is agreeable to see you first thing in the office tomorrow morning at 830.  His contact information and address are listed below. He is planning to see you at 830, so please arrive shortly before that to ensure you are seen on time.  Per his request you have been prescribed an additional antibiotic at home, as well as steroids.  Do not worry about picking them up this evening, as you have been administered both steroid and antibiotic in the emergency department.  Please return to the emergency department if you develop any worsening fevers, chills, pain, numbness, tingling, cool to the touch in your hand, as these can be signs of worsening infection.

## 2020-05-05 NOTE — ED Notes (Signed)
Pt given soup family brought in with approval from [facility]

## 2020-07-05 ENCOUNTER — Other Ambulatory Visit: Payer: Self-pay

## 2020-07-05 ENCOUNTER — Emergency Department (HOSPITAL_BASED_OUTPATIENT_CLINIC_OR_DEPARTMENT_OTHER)
Admission: EM | Admit: 2020-07-05 | Discharge: 2020-07-05 | Disposition: A | Discharge status: Home / Self Care | Payer: BC Managed Care – PPO | Attending: Emergency Medicine | Admitting: Emergency Medicine

## 2020-07-05 ENCOUNTER — Telehealth: Payer: Self-pay | Admitting: *Deleted

## 2020-07-05 ENCOUNTER — Encounter (HOSPITAL_BASED_OUTPATIENT_CLINIC_OR_DEPARTMENT_OTHER): Payer: Self-pay | Admitting: Emergency Medicine

## 2020-07-05 DIAGNOSIS — J029 Acute pharyngitis, unspecified: Secondary | ICD-10-CM | POA: Diagnosis present

## 2020-07-05 DIAGNOSIS — U071 COVID-19: Secondary | ICD-10-CM | POA: Diagnosis not present

## 2020-07-05 DIAGNOSIS — F17290 Nicotine dependence, other tobacco product, uncomplicated: Secondary | ICD-10-CM | POA: Insufficient documentation

## 2020-07-05 DIAGNOSIS — Z20822 Contact with and (suspected) exposure to covid-19: Secondary | ICD-10-CM

## 2020-07-05 LAB — SARS CORONAVIRUS 2 (TAT 6-24 HRS): SARS Coronavirus 2: POSITIVE — AB

## 2020-07-05 LAB — GROUP A STREP BY PCR: Group A Strep by PCR: NOT DETECTED

## 2020-07-05 MED ORDER — IBUPROFEN 400 MG PO TABS: 400.0000 mg | ORAL_TABLET | Freq: Four times a day (QID) | ORAL | 0 refills | Status: DC | PRN

## 2020-07-05 MED ORDER — ACETAMINOPHEN 325 MG PO TABS
650.0000 mg | ORAL_TABLET | Freq: Once | ORAL | Status: AC
  Administered 2020-07-05: 650 mg via ORAL
  Filled 2020-07-05: qty 2

## 2020-07-05 NOTE — ED Provider Notes (Signed)
[facility] EMERGENCY DEPARTMENT Provider Note   CSN: [id number] Arrival date & time: 07/05/20  0205     History Chief Complaint  Patient presents with  . Sore Throat    Terry Burton is a 61 y.o. male.  The history is provided by the patient.  Sore Throat This is a new problem. The current episode started yesterday. The problem occurs constantly. The problem has not changed since onset.Pertinent negatives include no chest pain, no abdominal pain, no headaches and no shortness of breath. Nothing aggravates the symptoms. Nothing relieves the symptoms. He has tried nothing for the symptoms. The treatment provided no relief.  Congestion and sore throat since yesterday.  No change in voice, no SOB.  No f/c/r.      Past Medical History:  Diagnosis Date  . Arthritis    right hip and back with pain down right leg  . Gout     Patient Active Problem List   Diagnosis Date Noted  . Chronic pain of right knee 01/26/2017  . Acute pain of right shoulder 12/10/2016  . Right hip pain 12/10/2016  . Hip arthritis 05/01/2011    Past Surgical History:  Procedure Laterality Date  . FRACTURE SURGERY     repair right elbow years ago- no retained hardware  . TOTAL HIP ARTHROPLASTY  05/01/2011   Procedure: TOTAL HIP ARTHROPLASTY ANTERIOR APPROACH;  Surgeon: Kathryne Hitch;  Location: [facility];  Service: Orthopedics;  Laterality: Right;       History reviewed. No pertinent family history.  Social History   Tobacco Use  . Smoking status: Current Some Day Smoker    Packs/day: 2.00    Years: 10.00    Pack years: 20.00    Types: Cigars    Last attempt to quit: 04/27/1998    Years since quitting: 22.2  . Smokeless tobacco: Never Used  Substance Use Topics  . Alcohol use: Yes    Alcohol/week: 6.0 standard drinks    Types: 6 Cans of beer per week    Comment: occ  . Drug use: No    Home Medications Prior to Admission medications   Medication Sig Start Date End  Date Taking? Authorizing Provider  ALLOPURINOL PO Take by mouth.   Yes [provider]  amLODipine (NORVASC) 2.5 MG tablet Take 1 tablet by mouth daily. 03/11/20  Yes [provider]  colchicine 0.6 MG tablet Take 1 tablet up to every 8 hours (3 times maximum ) on the first day prescribed. On the second day may take 1 tablet twice a day. May continue 1 tablet twice a day until pain is significantly improved. Then you may take 1 tablet daily. 01/10/17   Arby Barrette, MD  geriatric multivitamins-minerals (ELDERTONIC/GEVRABON) ELIX Take 15 mLs by mouth daily.    [provider]  HYDROcodone-acetaminophen (NORCO/VICODIN) 5-325 MG tablet Take 1-2 tablets by mouth every 6 (six) hours as needed. 04/22/16   Mesner, Barbara Cower, MD  HYDROcodone-acetaminophen (NORCO/VICODIN) 5-325 MG tablet Take 1-2 tablets by mouth every 4 (four) hours as needed for moderate pain or severe pain. 01/10/17   Arby Barrette, MD  indomethacin (INDOCIN) 25 MG capsule Take 1 capsule (25 mg total) by mouth 3 (three) times daily as needed. 12/12/15   Mackuen, Courteney Lyn, MD  methocarbamol (ROBAXIN) 500 MG tablet Take 1 tablet (500 mg total) by mouth every 8 (eight) hours as needed for muscle spasms. 12/10/16   Kathryne Hitch, MD  predniSONE (STERAPRED UNI-PAK 21 TAB) 10  MG (21) TBPK tablet Take by mouth daily. Take 6 tabs by mouth daily  for 2 days, then 5 tabs for 2 days, then 4 tabs for 2 days, then 3 tabs for 2 days, 2 tabs for 2 days, then 1 tab by mouth daily for 2 days 05/05/20   Loleta Dicker R, PA-C    Allergies    Patient has no known allergies.  Review of Systems   Review of Systems  Constitutional: Negative for fever.  HENT: Positive for congestion and sore throat. Negative for trouble swallowing and voice change.   Eyes: Negative for visual disturbance.  Respiratory: Negative for cough, shortness of breath and stridor.   Cardiovascular: Negative for chest pain.   Gastrointestinal: Negative for abdominal pain.  Genitourinary: Negative for difficulty urinating.  Musculoskeletal: Negative for arthralgias.  Skin: Negative for rash.  Neurological: Negative for headaches.  Psychiatric/Behavioral: Negative for agitation.  All other systems reviewed and are negative.   Physical Exam Updated Vital Signs BP (!) 146/91 (BP Location: Right Arm)   Pulse 87   Temp 98.9 F (37.2 C)   Resp 18   SpO2 98%   Physical Exam Vitals and nursing note reviewed.  Constitutional:      General: He is not in acute distress.    Appearance: He is well-developed.  HENT:     Head: Normocephalic and atraumatic.     Nose: No congestion.     Mouth/Throat:     Mouth: Mucous membranes are moist.     Pharynx: Oropharynx is clear. No pharyngeal swelling, oropharyngeal exudate, posterior oropharyngeal erythema or uvula swelling.  Eyes:     Conjunctiva/sclera: Conjunctivae normal.     Pupils: Pupils are equal, round, and reactive to light.  Cardiovascular:     Rate and Rhythm: Normal rate and regular rhythm.     Heart sounds: Normal heart sounds.  Pulmonary:     Effort: Pulmonary effort is normal.     Breath sounds: Normal breath sounds.  Abdominal:     General: Bowel sounds are normal.     Palpations: Abdomen is soft.  Musculoskeletal:     Cervical back: Normal range of motion and neck supple.  Lymphadenopathy:     Cervical: No cervical adenopathy.  Skin:    General: Skin is warm and dry.     Capillary Refill: Capillary refill takes less than 2 seconds.  Neurological:     General: No focal deficit present.     Mental Status: He is alert and oriented to person, place, and time.  Psychiatric:        Mood and Affect: Mood normal.        Behavior: Behavior normal.     ED Results / Procedures / Treatments   Labs (all labs ordered are listed, but only abnormal results are displayed) Results for orders placed or performed during the hospital encounter of  07/05/20  Group A Strep by PCR   Specimen: Throat; Sterile Swab  Result Value Ref Range   Group A Strep by PCR NOT DETECTED NOT DETECTED   No results found.  Radiology No results found.  Procedures Procedures   Medications Ordered in ED Medications  acetaminophen (TYLENOL) tablet 650 mg (650 mg Oral Given 07/05/20 3419)    ED Course  I have reviewed the triage vital signs and the nursing notes.  Pertinent labs & imaging results that were available during my care of the patient were reviewed by me and considered in my medical decision making (  see chart for details).    Not strep, with congestion my concern is for covid.  Swab was sent and will appear in my chart.  Alternate tylenol and ibuprofen.  Strict quarantine instructions given.    JERMARION POFFENBERGER was evaluated in Emergency Department on 07/05/2020 for the symptoms described in the history of present illness. He was evaluated in the context of the global COVID-19 pandemic, which necessitated consideration that the patient might be at risk for infection with the SARS-CoV-2 virus that causes COVID-19. Institutional protocols and algorithms that pertain to the evaluation of patients at risk for COVID-19 are in a state of rapid change based on information released by regulatory bodies including the CDC and federal and state organizations. These policies and algorithms were followed during the patient's care in the ED.  Final Clinical Impression(s) / ED Diagnoses Return for intractable cough, coughing up blood, fevers >100.4 unrelieved by medication, shortness of breath, intractable vomiting, chest pain, shortness of breath, weakness, numbness, changes in speech, facial asymmetry, abdominal pain, passing out, Inability to tolerate liquids or food, cough, altered mental status or any concerns. No signs of systemic illness or infection. The patient is nontoxic-appearing on exam and vital signs are within normal limits.  I have reviewed the  triage vital signs and the nursing notes. Pertinent labs & imaging results that were available during my care of the patient were reviewed by me and considered in my medical decision making (see chart for details). After history, exam, and medical workup I feel the patient has been appropriately medically screened and is safe for discharge home. Pertinent diagnoses were discussed with the patient. Patient was given return precautions.    Dilpreet Faires, MD 07/05/20 [phone]

## 2020-07-05 NOTE — Discharge Instructions (Signed)
Person Under Monitoring Name: Terry Burton  Location: [address] [place] [address]   Infection Prevention Recommendations for Individuals Confirmed to have, or Being Evaluated for, 2019 Novel Coronavirus (COVID-19) Infection Who Receive Care at Home  Individuals who are confirmed to have, or are being evaluated for, COVID-19 should follow the prevention steps below until a healthcare provider or local or state health department says they can return to normal activities.  Stay home except to get medical care You should restrict activities outside your home, except for getting medical care. Do not go to work, school, or public areas, and do not use public transportation or taxis.  Call ahead before visiting your doctor Before your medical appointment, call the healthcare provider and tell them that you have, or are being evaluated for, COVID-19 infection. This will help the healthcare providers office take steps to keep other people from getting infected. Ask your healthcare provider to call the local or state health department.  Monitor your symptoms Seek prompt medical attention if your illness is worsening (e.g., difficulty breathing). Before going to your medical appointment, call the healthcare provider and tell them that you have, or are being evaluated for, COVID-19 infection. Ask your healthcare provider to call the local or state health department.  Wear a facemask You should wear a facemask that covers your nose and mouth when you are in the same room with other people and when you visit a healthcare provider. People who live with or visit you should also wear a facemask while they are in the same room with you.  Separate yourself from other people in your home As much as possible, you should stay in a different room from other people in your home. Also, you should use a separate bathroom, if available.  Avoid sharing household items You should  not share dishes, drinking glasses, cups, eating utensils, towels, bedding, or other items with other people in your home. After using these items, you should wash them thoroughly with soap and water.  Cover your coughs and sneezes Cover your mouth and nose with a tissue when you cough or sneeze, or you can cough or sneeze into your sleeve. Throw used tissues in a lined trash can, and immediately wash your hands with soap and water for at least 20 seconds or use an alcohol-based hand rub.  Wash your [employer] your hands often and thoroughly with soap and water for at least 20 seconds. You can use an alcohol-based hand sanitizer if soap and water are not available and if your hands are not visibly dirty. Avoid touching your eyes, nose, and mouth with unwashed hands.   Prevention Steps for Caregivers and Household Members of Individuals Confirmed to have, or Being Evaluated for, COVID-19 Infection Being Cared for in the Home  If you live with, or provide care at home for, a person confirmed to have, or being evaluated for, COVID-19 infection please follow these guidelines to prevent infection:  Follow healthcare providers instructions Make sure that you understand and can help the patient follow any healthcare provider instructions for all care.  Provide for the patients basic needs You should help the patient with basic needs in the home and provide support for getting groceries, prescriptions, and other personal needs.  Monitor the patients symptoms If they are getting sicker, call his or her medical provider and tell them that the patient has, or is being evaluated for, COVID-19 infection. This will help the healthcare  providers office take steps to keep other people from getting infected. Ask the healthcare provider to call the local or state health department.  Limit the number of people who have contact with the patient If possible, have only one caregiver for the  patient. Other household members should stay in another home or place of residence. If this is not possible, they should stay in another room, or be separated from the patient as much as possible. Use a separate bathroom, if available. Restrict visitors who do not have an essential need to be in the home.  Keep older adults, very young children, and other sick people away from the patient Keep older adults, very young children, and those who have compromised immune systems or chronic health conditions away from the patient. This includes people with chronic heart, lung, or kidney conditions, diabetes, and cancer.  Ensure good ventilation Make sure that shared spaces in the home have good air flow, such as from an air conditioner or an opened window, weather permitting.  Wash your hands often Wash your hands often and thoroughly with soap and water for at least 20 seconds. You can use an alcohol based hand sanitizer if soap and water are not available and if your hands are not visibly dirty. Avoid touching your eyes, nose, and mouth with unwashed hands. Use disposable paper towels to dry your hands. If not available, use dedicated cloth towels and replace them when they become wet.  Wear a facemask and gloves Wear a disposable facemask at all times in the room and gloves when you touch or have contact with the patients blood, body fluids, and/or secretions or excretions, such as sweat, saliva, sputum, nasal mucus, vomit, urine, or feces.  Ensure the mask fits over your nose and mouth tightly, and do not touch it during use. Throw out disposable facemasks and gloves after using them. Do not reuse. Wash your hands immediately after removing your facemask and gloves. If your personal clothing becomes contaminated, carefully remove clothing and launder. Wash your hands after handling contaminated clothing. Place all used disposable facemasks, gloves, and other waste in a lined container before  disposing them with other household waste. Remove gloves and wash your hands immediately after handling these items.  Do not share dishes, glasses, or other household items with the patient Avoid sharing household items. You should not share dishes, drinking glasses, cups, eating utensils, towels, bedding, or other items with a patient who is confirmed to have, or being evaluated for, COVID-19 infection. After the person uses these items, you should wash them thoroughly with soap and water.  Wash laundry thoroughly Immediately remove and wash clothes or bedding that have blood, body fluids, and/or secretions or excretions, such as sweat, saliva, sputum, nasal mucus, vomit, urine, or feces, on them. Wear gloves when handling laundry from the patient. Read and follow directions on labels of laundry or clothing items and detergent. In general, wash and dry with the warmest temperatures recommended on the label.  Clean all areas the individual has used often Clean all touchable surfaces, such as counters, tabletops, doorknobs, bathroom fixtures, toilets, phones, keyboards, tablets, and bedside tables, every day. Also, clean any surfaces that may have blood, body fluids, and/or secretions or excretions on them. Wear gloves when cleaning surfaces the patient has come in contact with. Use a diluted bleach solution (e.g., dilute bleach with 1 part bleach and 10 parts water) or a household disinfectant with a label that says EPA-registered for coronaviruses. To make  a bleach solution at home, add 1 tablespoon of bleach to 1 quart (4 cups) of water. For a larger supply, add  cup of bleach to 1 gallon (16 cups) of water. Read labels of cleaning products and follow recommendations provided on product labels. Labels contain instructions for safe and effective use of the cleaning product including precautions you should take when applying the product, such as wearing gloves or eye protection and making sure you  have good ventilation during use of the product. Remove gloves and wash hands immediately after cleaning.  Monitor yourself for signs and symptoms of illness Caregivers and household members are considered close contacts, should monitor their health, and will be asked to limit movement outside of the home to the extent possible. Follow the monitoring steps for close contacts listed on the symptom monitoring form.   ? If you have additional questions, contact your local health department or call the epidemiologist on call at [phone] (available 24/7). ? This guidance is subject to change. For the most up-to-date guidance from [facility], please refer to their website: [email or web address]

## 2020-07-05 NOTE — ED Triage Notes (Signed)
Sore throat, trouble swallowing since yesterday.

## 2020-07-05 NOTE — Telephone Encounter (Signed)
Pt notified of negative COVID-19 results. Understanding verbalized.   

## 2020-07-06 ENCOUNTER — Telehealth (HOSPITAL_COMMUNITY): Payer: Self-pay | Admitting: Oncology

## 2020-07-06 NOTE — Telephone Encounter (Signed)
Called to discuss with patient about COVID-19 symptoms and the use of one of the available treatments for those with mild to moderate Covid symptoms and at a high risk of hospitalization.  Pt appears to qualify for outpatient treatment due to co-morbid conditions and/or a member of an at-risk group in accordance with the FDA Emergency Use Authorization.    Symptom onset: 07/04/20 Vaccinated: unsure Booster? unsure Immunocompromised? no Qualifiers:  Past Medical History:  Diagnosis Date  . Arthritis    right hip and back with pain down right leg  . Gout    Obesity and smoker  Unable to reach pt - sent text message- no vm or mc available   Mauro Kaufmann

## 2020-09-30 ENCOUNTER — Emergency Department (HOSPITAL_BASED_OUTPATIENT_CLINIC_OR_DEPARTMENT_OTHER): Payer: BC Managed Care – PPO

## 2020-09-30 ENCOUNTER — Emergency Department (HOSPITAL_BASED_OUTPATIENT_CLINIC_OR_DEPARTMENT_OTHER)
Admission: EM | Admit: 2020-09-30 | Discharge: 2020-09-30 | Disposition: A | Discharge status: Home / Self Care | Payer: BC Managed Care – PPO | Attending: Emergency Medicine | Admitting: Emergency Medicine

## 2020-09-30 ENCOUNTER — Encounter (HOSPITAL_BASED_OUTPATIENT_CLINIC_OR_DEPARTMENT_OTHER): Payer: Self-pay

## 2020-09-30 ENCOUNTER — Other Ambulatory Visit: Payer: Self-pay

## 2020-09-30 DIAGNOSIS — M25572 Pain in left ankle and joints of left foot: Secondary | ICD-10-CM | POA: Insufficient documentation

## 2020-09-30 DIAGNOSIS — M7121 Synovial cyst of popliteal space [Baker], right knee: Secondary | ICD-10-CM | POA: Insufficient documentation

## 2020-09-30 DIAGNOSIS — F17210 Nicotine dependence, cigarettes, uncomplicated: Secondary | ICD-10-CM | POA: Diagnosis not present

## 2020-09-30 DIAGNOSIS — M25571 Pain in right ankle and joints of right foot: Secondary | ICD-10-CM | POA: Insufficient documentation

## 2020-09-30 DIAGNOSIS — M10062 Idiopathic gout, left knee: Secondary | ICD-10-CM

## 2020-09-30 DIAGNOSIS — Z96641 Presence of right artificial hip joint: Secondary | ICD-10-CM | POA: Insufficient documentation

## 2020-09-30 DIAGNOSIS — M25562 Pain in left knee: Secondary | ICD-10-CM | POA: Diagnosis present

## 2020-09-30 LAB — CBC WITH DIFFERENTIAL/PLATELET
Abs Immature Granulocytes: 0.06 10*3/uL (ref 0.00–0.07)
Basophils Absolute: 0 10*3/uL (ref 0.0–0.1)
Basophils Relative: 0 %
Eosinophils Absolute: 0.2 10*3/uL (ref 0.0–0.5)
Eosinophils Relative: 1 %
HCT: 41.2 % (ref 39.0–52.0)
Hemoglobin: 13.6 g/dL (ref 13.0–17.0)
Immature Granulocytes: 1 %
Lymphocytes Relative: 11 %
Lymphs Abs: 1.2 10*3/uL (ref 0.7–4.0)
MCH: 30.5 pg (ref 26.0–34.0)
MCHC: 33 g/dL (ref 30.0–36.0)
MCV: 92.4 fL (ref 80.0–100.0)
Monocytes Absolute: 0.9 10*3/uL (ref 0.1–1.0)
Monocytes Relative: 8 %
Neutro Abs: 8.8 10*3/uL — ABNORMAL HIGH (ref 1.7–7.7)
Neutrophils Relative %: 79 %
Platelets: 263 10*3/uL (ref 150–400)
RBC: 4.46 MIL/uL (ref 4.22–5.81)
RDW: 12.9 % (ref 11.5–15.5)
WBC: 11.1 10*3/uL — ABNORMAL HIGH (ref 4.0–10.5)
nRBC: 0 % (ref 0.0–0.2)

## 2020-09-30 LAB — COMPREHENSIVE METABOLIC PANEL
ALT: 21 U/L (ref 0–44)
AST: 18 U/L (ref 15–41)
Albumin: 3.6 g/dL (ref 3.5–5.0)
Alkaline Phosphatase: 61 U/L (ref 38–126)
Anion gap: 8 (ref 5–15)
BUN: 14 mg/dL (ref 6–20)
CO2: 26 mmol/L (ref 22–32)
Calcium: 8.7 mg/dL — ABNORMAL LOW (ref 8.9–10.3)
Chloride: 100 mmol/L (ref 98–111)
Creatinine, Ser: 0.88 mg/dL (ref 0.61–1.24)
GFR, Estimated: >60 mL/min (ref >60)
Glucose, Bld: 109 mg/dL — ABNORMAL HIGH (ref 70–99)
Potassium: 4.1 mmol/L (ref 3.5–5.1)
Sodium: 134 mmol/L — ABNORMAL LOW (ref 135–145)
Total Bilirubin: 0.9 mg/dL (ref 0.3–1.2)
Total Protein: 7.3 g/dL (ref 6.5–8.1)

## 2020-09-30 MED ORDER — COLCHICINE 0.6 MG PO TABS: 0.6000 mg | ORAL_TABLET | Freq: Every day | ORAL | 0 refills | Status: DC

## 2020-09-30 MED ORDER — PREDNISONE 50 MG PO TABS
60.0000 mg | ORAL_TABLET | Freq: Once | ORAL | Status: AC
  Administered 2020-09-30: 60 mg via ORAL
  Filled 2020-09-30: qty 1

## 2020-09-30 MED ORDER — OXYCODONE-ACETAMINOPHEN 5-325 MG PO TABS
1.0000 | ORAL_TABLET | Freq: Once | ORAL | Status: AC
  Administered 2020-09-30: 1 via ORAL
  Filled 2020-09-30: qty 1

## 2020-09-30 MED ORDER — OXYCODONE HCL 5 MG PO TABS: 5.0000 mg | ORAL_TABLET | Freq: Four times a day (QID) | ORAL | 0 refills | Status: AC | PRN

## 2020-09-30 MED ORDER — PREDNISONE 10 MG PO TABS: 40.0000 mg | ORAL_TABLET | Freq: Every day | ORAL | 0 refills | Status: AC

## 2020-09-30 NOTE — ED Triage Notes (Cosign Needed)
Pt reports bilateral knee and ankle swelling. Reports 10/10 LEFT knee pain. Pt reports hx of gout.

## 2020-09-30 NOTE — Discharge Instructions (Signed)
You were seen in the emergency department for pain in both of your knees with swelling, pain in both of your ankles as well as swelling in the back of your right calf.  Ultrasound of your right leg did not show a blood clot but confirmed a Baker's cyst in the back of your knee.  This can cause leg swelling and pain in the back of your leg and calf.  We have placed you in a Ace dressing to help with compression and swelling.  Please follow-up with Dr. Clare Gandy in the sports clinic for reevaluation.  You may need aspiration, injection of steroids, etc. return to the ED if you have significant only worsening swelling in the back of your leg, discoloration or bruising, decreased sensation, tingling to your foot or leg  I think your left knee pain and swelling and redness and warmth is from a gout flare.  We will treat this with prednisone, colchicine, oxycodone for severe pain.  You need repeat uric acid levels checked.  Make sure you do not miss your allopurinol.  See attached low purine eating plan.  Return to the ED for worsening knee swelling, redness, warmth, fevers or chills

## 2020-09-30 NOTE — ED Provider Notes (Signed)
[facility] EMERGENCY DEPARTMENT Provider Note   CSN: [id number] Arrival date & time: 09/30/20  1009     History Chief Complaint  Patient presents with  . Knee Pain  . Ankle Pain  . Joint Swelling    Terry Burton is a 60 y.o. male with history of gout presents to the ER for pain in both knees and both ankles.  Both knees are also swollen, left worse than right.  Only right ankle swollen worse than left. Right knee hurts mostly on the back and has associated swelling down the leg to the ankle. States he has had gout flares in both knees, hands, ankles.  The pain in his left knee feels like gout because it is red, warm, tender. But the pain in the left knee feels different because the pain is in the back and there is swelling down the leg.  Also has pain in the left calf.  Pain in the right leg and swelling began after golfing yesterday.  The pain in the left knee and both ankles began Thursday/Friday.  Pain is worsening.  He takes allopurinol for gout but admits he missed a few days last week because he was out of town.  He ran out of colchicine.  Had a piece of steak last week. No beer, shellfish.  No direct injury, fall. No recent injections in these joints. No fevers, chills. No IVDU. No recent viral illness. No history of diabetes.  No history of DVT. 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 No chest pain or shortness of breath. Took a tylenol without improvement. Pain worse with movement, palpation, walking.   HPI     Past Medical History:  Diagnosis Date  . Arthritis    right hip and back with pain down right leg  . Gout     Patient Active Problem List   Diagnosis Date Noted  . Chronic pain of right knee 01/26/2017  . Acute pain of right shoulder 12/10/2016  . Right hip pain 12/10/2016  . Hip arthritis 05/01/2011    Past Surgical History:  Procedure Laterality Date  . FRACTURE SURGERY     repair right elbow  years ago- no retained hardware  . TOTAL HIP ARTHROPLASTY  05/01/2011   Procedure: TOTAL HIP ARTHROPLASTY ANTERIOR APPROACH;  Surgeon: Kathryne Hitch;  Location: [facility];  Service: Orthopedics;  Laterality: Right;       History reviewed. No pertinent family history.  Social History   Tobacco Use  . Smoking status: Current Some Day Smoker    Packs/day: 2.00    Years: 10.00    Pack years: 20.00    Types: Cigars    Last attempt to quit: 04/27/1998    Years since quitting: 22.4  . Smokeless tobacco: Never Used  Substance Use Topics  . Alcohol use: Yes    Alcohol/week: 6.0 standard drinks    Types: 6 Cans of beer per week    Comment: occ  . Drug use: No    Home Medications Prior to Admission medications   Medication Sig Start Date End Date Taking? Authorizing Provider  colchicine 0.6 MG tablet Take 1 tablet (0.6 mg total) by mouth daily. Take 1.2 mg in the morning and 0.6 mg in the evening of the first day, then take 0.6 mg daily until symptoms resolve 09/30/20 10/30/20 Yes Liberty Handy, PA-C  oxyCODONE (OXY IR/ROXICODONE) 5 MG immediate release tablet Take  1 tablet (5 mg total) by mouth every 6 (six) hours as needed for up to 3 days for severe pain or breakthrough pain. 09/30/20 10/03/20 Yes Liberty Handy, PA-C  predniSONE (DELTASONE) 10 MG tablet Take 4 tablets (40 mg total) by mouth daily for 7 days. 09/30/20 10/07/20 Yes Liberty Handy, PA-C  ALLOPURINOL PO Take by mouth.    [provider]  amLODipine (NORVASC) 2.5 MG tablet Take 1 tablet by mouth daily. 03/11/20   [provider]  geriatric multivitamins-minerals (ELDERTONIC/GEVRABON) ELIX Take 15 mLs by mouth daily.    [provider]  HYDROcodone-acetaminophen (NORCO/VICODIN) 5-325 MG tablet Take 1-2 tablets by mouth every 6 (six) hours as needed. 04/22/16   Mesner, Barbara Cower, MD  HYDROcodone-acetaminophen (NORCO/VICODIN) 5-325 MG tablet Take 1-2 tablets by mouth every 4 (four) hours as needed  for moderate pain or severe pain. 01/10/17   Arby Barrette, MD  ibuprofen (ADVIL) 400 MG tablet Take 1 tablet (400 mg total) by mouth every 6 (six) hours as needed. 07/05/20   Palumbo, April, MD  indomethacin (INDOCIN) 25 MG capsule Take 1 capsule (25 mg total) by mouth 3 (three) times daily as needed. 12/12/15   Mackuen, Courteney Lyn, MD  methocarbamol (ROBAXIN) 500 MG tablet Take 1 tablet (500 mg total) by mouth every 8 (eight) hours as needed for muscle spasms. 12/10/16   Kathryne Hitch, MD    Allergies    Patient has no known allergies.  Review of Systems   Review of Systems  Cardiovascular: Positive for leg swelling (right leg swelling ).  Musculoskeletal: Positive for arthralgias, gait problem and joint swelling.  All other systems reviewed and are negative.   Physical Exam Updated Vital Signs BP 132/87   Pulse 80   Temp 98.2 F (36.8 C) (Oral)   Resp 16   Ht 6\' 1"  (1.854 m)   Wt 92 kg   SpO2 100%   BMI 26.76 kg/m   Physical Exam Vitals and nursing note reviewed.  Constitutional:      General: He is not in acute distress.    Appearance: He is well-developed.     Comments: NAD.  HENT:     Head: Normocephalic and atraumatic.     Right Ear: External ear normal.     Left Ear: External ear normal.     Nose: Nose normal.  Eyes:     General: No scleral icterus.    Conjunctiva/sclera: Conjunctivae normal.  Cardiovascular:     Rate and Rhythm: Normal rate and regular rhythm.     Heart sounds: Normal heart sounds.  Pulmonary:     Effort: Pulmonary effort is normal.     Breath sounds: Normal breath sounds.  Musculoskeletal:        General: No deformity. Normal range of motion.     Cervical back: Normal range of motion and neck supple.     Right knee: Tenderness (posterior knee) present.     Left knee: Effusion and erythema (warmth) present. Tenderness (anterior/medial ) present.     Right lower leg: Swelling and tenderness (popliteal space, proximal calf)  present. 1+ Edema present.     Right ankle: Swelling present. Tenderness (diffuse) present.     Left ankle: No swelling. Tenderness (diffuse) present.     Comments: RLE: No significant edema of anterior knee. Mild fullness popliteal space and tenderness. Diffuse pitting edema 1+ from calf down to ankle. TTP calf and positive Homan's sign.  Full ROM of knee and ankle with  mild pain reported. Diffuse ankle edema and tenderness, no warmth or erythema. Ankle has full ROM without pain.   LLE: Moderate effusion, erythema, warmth left knee anteriorly and TTP mostly medial joint line of the knee. Moderate decreased ROM due to pain, however patient still able to flex knee 45 degrees and extend knee fully. No edema, warmth, erythema of left ankle, ankle has mild diffuse tenderness and full ROM.   Skin:    General: Skin is warm and dry.     Capillary Refill: Capillary refill takes less than 2 seconds.  Neurological:     Mental Status: He is alert and oriented to person, place, and time.  Psychiatric:        Behavior: Behavior normal.        Thought Content: Thought content normal.        Judgment: Judgment normal.     ED Results / Procedures / Treatments   Labs (all labs ordered are listed, but only abnormal results are displayed) Labs Reviewed  CBC WITH DIFFERENTIAL/PLATELET - Abnormal; Notable for the following components:      Result Value   WBC 11.1 (*)    Neutro Abs 8.8 (*)    All other components within normal limits  COMPREHENSIVE METABOLIC PANEL - Abnormal; Notable for the following components:   Sodium 134 (*)    Glucose, Bld 109 (*)    Calcium 8.7 (*)    All other components within normal limits    EKG None  Radiology US Venous Img Lower Right (DVT Study)  Result Date: 09/30/2020 CLINICAL DATA:  Right lower extremity pain and edema for the past day. Evaluate for DVT. EXAM: RIGHT LOWER EXTREMITY VENOUS DOPPLER ULTRASOUND TECHNIQUE: Gray-scale sonography with graded compression,  as well as color Doppler and duplex ultrasound were performed to evaluate the lower extremity deep venous systems from the level of the common femoral vein and including the common femoral, femoral, profunda femoral, popliteal and calf veins including the posterior tibial, peroneal and gastrocnemius veins when visible. The superficial great saphenous vein was also interrogated. Spectral Doppler was utilized to evaluate flow at rest and with distal augmentation maneuvers in the common femoral, femoral and popliteal veins. COMPARISON:  None. FINDINGS: Contralateral Common Femoral Vein: Respiratory phasicity is normal and symmetric with the symptomatic side. No evidence of thrombus. Normal compressibility. Common Femoral Vein: No evidence of thrombus. Normal compressibility, respiratory phasicity and response to augmentation. Saphenofemoral Junction: No evidence of thrombus. Normal compressibility and flow on color Doppler imaging. Profunda Femoral Vein: No evidence of thrombus. Normal compressibility and flow on color Doppler imaging. Femoral Vein: No evidence of thrombus. Normal compressibility, respiratory phasicity and response to augmentation. Popliteal Vein: No evidence of thrombus. Normal compressibility, respiratory phasicity and response to augmentation. Calf Veins: No evidence of thrombus. Normal compressibility and flow on color Doppler imaging. Superficial Great Saphenous Vein: No evidence of thrombus. Normal compressibility. Venous Reflux:  None. Other Findings: Note is made of an approximately 5.6 x 1.5 x 2.3 cm minimally complex fluid collection with the right popliteal fossa compatible with a Baker's cyst. IMPRESSION: 1. No evidence of DVT within the right lower extremity. 2. Note made of an approximately 5.6 cm minimally complex right-sided Baker's cyst. Electronically Signed   By: Simonne Come M.D.   On: 09/30/2020 12:07    Procedures Procedures   Medications Ordered in ED Medications   oxyCODONE-acetaminophen (PERCOCET/ROXICET) 5-325 MG per tablet 1 tablet (1 tablet Oral Given 09/30/20 1136)  predniSONE (DELTASONE) tablet 60  mg (60 mg Oral Given 09/30/20 1135)    ED Course  I have reviewed the triage vital signs and the nursing notes.  Pertinent labs & imaging results that were available during my care of the patient were reviewed by me and considered in my medical decision making (see chart for details).  Clinical Course as of 09/30/20 1321  Mon Sep 30, 2020  1209 WBC(!): 11.1 [CG]  1254 US Venous Img Lower Right (DVT Study) IMPRESSION: 1. No evidence of DVT within the right lower extremity. 2. Note made of an approximately 5.6 cm minimally complex  right-sided Baker's cyst. [CG]    Clinical Course User Index [CG] Liberty HandyGibbons, Aaryana Betke J, PA-C   MDM Rules/Calculators/A&P                          60 year old male with history of gout on allopurinol and colchicine presents to the ED for pain in both knees, both ankles as well as swelling on the left knee and entire right lower leg.   On physical exam of the left knee, he has moderate effusion, erythema, warmth and pain with slight decreased range of motion of the left knee.  This is most consistent with a gout flare.  Has had gout flares in this joint before.  Also has had joint aspirations in the past in the emergency department, per patient report where they told him that he had gout.  Chart reviewed shows patient has well-documented history of gout and seen in the emergency department for it.  He has had flares in both ankles, both knees.  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  We will plan to treat for gout with prednisone, colchicine, pain medicine.  At this time I do not think we need an emergent x-ray as he has no injury or fall, trauma and fracture and dislocation was considered very unlikely.  Given his well-documented history of gout and no other risk  factors for septic arthritis feel this is less likely and will defer repeat arthrocentesis here today.  No fevers, chills, IV drug use, diabetes.  Patient describes this pain as his usual pain from gout.  Last uric acid level 3 years ago was normal, he likely needs repeat uric acid levels checked.   On physical exam of the right knee patient has posterior/popliteal space fullness, tenderness and calf tenderness as well as with swelling.  He has no significant joint erythema, warmth or significant tenderness.  Ultrasound of the right lower extremity was done today and this was negative for DVT but confirmed a 5.6 cm right-sided Baker's cyst.  This fits clinical picture.  It started after a day of golfing. Again, patient reports no direct trauma to the right knee and feel there is no indication for emergent x-rays as a fracture and dislocation is highly unlikely with this clinical presentation.  We will place patient in a compression dressing with Ace bandage to help with swelling and pain.  We will refer him to Dr. Clare GandyJeremy Schmitz for repeat evaluation of right Baker cyst possible aspiration, steroid injection, etc.  Discussed this plan with patient who is in agreement.  There is no signs on exam today to suggest Baker's cyst rupture, other complication.  He is neurovascularly intact distally and no symptoms to suggest nerve entrapment. Final Clinical Impression(s) / ED Diagnoses Final diagnoses:  Baker's cyst, right  Acute idiopathic gout of left knee  Rx / DC Orders ED Discharge Orders         Ordered    predniSONE (DELTASONE) 10 MG tablet  Daily        09/30/20 1321    colchicine 0.6 MG tablet  Daily        09/30/20 1321    oxyCODONE (OXY IR/ROXICODONE) 5 MG immediate release tablet  Every 6 hours PRN        09/30/20 1321           Liberty Handy, [place] 09/30/20 1321    Little, Ambrose Finland, MD 10/01/20 [phone]

## 2020-10-01 ENCOUNTER — Ambulatory Visit: Payer: Self-pay

## 2020-10-01 ENCOUNTER — Encounter: Payer: Self-pay | Admitting: Family Medicine

## 2020-10-01 ENCOUNTER — Ambulatory Visit (INDEPENDENT_AMBULATORY_CARE_PROVIDER_SITE_OTHER): Payer: BC Managed Care – PPO | Admitting: Family Medicine

## 2020-10-01 VITALS — BP 132/88 | Ht 73.0 in | Wt 202.0 lb

## 2020-10-01 DIAGNOSIS — M25561 Pain in right knee: Secondary | ICD-10-CM

## 2020-10-01 DIAGNOSIS — M11261 Other chondrocalcinosis, right knee: Secondary | ICD-10-CM | POA: Diagnosis not present

## 2020-10-01 DIAGNOSIS — M7121 Synovial cyst of popliteal space [Baker], right knee: Secondary | ICD-10-CM

## 2020-10-01 DIAGNOSIS — M10062 Idiopathic gout, left knee: Secondary | ICD-10-CM

## 2020-10-01 MED ORDER — TRIAMCINOLONE ACETONIDE 40 MG/ML IJ SUSP
40.0000 mg | Freq: Once | INTRAMUSCULAR | Status: DC
Start: 2020-10-01 — End: 2020-10-01

## 2020-10-01 MED ORDER — TRIAMCINOLONE ACETONIDE 40 MG/ML IJ SUSP
40.0000 mg | Freq: Once | INTRAMUSCULAR | Status: AC
  Administered 2020-10-01: 40 mg via INTRA_ARTICULAR

## 2020-10-01 NOTE — Assessment & Plan Note (Signed)
Does have hyperlucencies within the meniscus on the right.  Unclear if this is gout deposition or compartment pseudogout. -Counseled on home exercise therapy and supportive care. -Injection today. -Could consider aspiration

## 2020-10-01 NOTE — Assessment & Plan Note (Signed)
Redness warmth and hyperemia on ultrasound to suggest gout.  No changes in the soft tissue to demonstrate an infectious cause.  Seems less likely for an infected joint.  Did have a slight leukocytosis but presented in similar fashion with previous gout flares. -Counseled on home exercise therapy and supportive care. -Injection today. -If no improvement would consider aspiration -May need consider uric acid and other inflammatory lab work.  Is currently on allopurinol.

## 2020-10-01 NOTE — Assessment & Plan Note (Signed)
Has a large complex cyst in the right side.  Unable to aspirate a large amount due to the complex nature. -Counseled supportive care. -Counseled on compression and heat. -Aspiration today. -Could consider irrigation.

## 2020-10-01 NOTE — Patient Instructions (Signed)
Nice to meet you  Please try heat and compression on the cyst behind the knee.  Please try ice for the knees  Please send me a message in MyChart with any questions or updates.  Please see me back in 1 week.   --Dr. Jordan Likes

## 2020-10-01 NOTE — Progress Notes (Signed)
Terry Burton - 60 y.o. male MRN [id number]  Date of birth: Jun 25, 1960  SUBJECTIVE:  Including CC & ROS.  No chief complaint on file.   Terry Burton is a 60 y.o. male that is presenting with bilateral knee pain.  Left knee is more severe than the right.  Having some redness and warmth of the left knee.  He does have a history of gout.  He is currently taking allopurinol 300 mg.  Has had repeated gout attacks in the past few months.  Has not had his knees affected like this previously.  Independent review of the right knee x-ray from 12/11/2019 shows no significant degenerative changes.  Independent review of the duplex from 5/2 shows a Baker's cyst.   Review of Systems See HPI   HISTORY: Past Medical, Surgical, Social, and Family History Reviewed & Updated per EMR.   Pertinent Historical Findings include:  Past Medical History:  Diagnosis Date  . Arthritis    right hip and back with pain down right leg  . Gout     Past Surgical History:  Procedure Laterality Date  . FRACTURE SURGERY     repair right elbow years ago- no retained hardware  . TOTAL HIP ARTHROPLASTY  05/01/2011   Procedure: TOTAL HIP ARTHROPLASTY ANTERIOR APPROACH;  Surgeon: Kathryne Hitch;  Location: [facility];  Service: Orthopedics;  Laterality: Right;    History reviewed. No pertinent family history.  Social History   Socioeconomic History  . Marital status: Married    Spouse name: Not on file  . Number of children: Not on file  . Years of education: Not on file  . Highest education level: Not on file  Occupational History  . Not on file  Tobacco Use  . Smoking status: Current Some Day Smoker    Packs/day: 2.00    Years: 10.00    Pack years: 20.00    Types: Cigars    Last attempt to quit: 04/27/1998    Years since quitting: 22.4  . Smokeless tobacco: Never Used  Substance and Sexual Activity  . Alcohol use: Yes    Alcohol/week: 6.0 standard drinks    Types: 6 Cans of beer per week     Comment: occ  . Drug use: No  . Sexual activity: Not on file  Other Topics Concern  . Not on file  Social History Narrative  . Not on file   Social Determinants of Health   Financial Resource Strain: Not on file  Food Insecurity: Not on file  Transportation Needs: Not on file  Physical Activity: Not on file  Stress: Not on file  Social Connections: Not on file  Intimate Partner Violence: Not on file     PHYSICAL EXAM:  VS: BP 132/88 (BP Location: Right Arm, Patient Position: Sitting, Cuff Size: Large)   Ht 6\' 1"  (1.854 m)   Wt 202 lb (91.6 kg)   BMI 26.65 kg/m  Physical Exam Gen: NAD, alert, cooperative with exam, well-appearing MSK:  Right and left knee: Redness and warmth of the left knee. Normal range of motion. Normal strength resistance. Neurovascular intact  Limited ultrasound: Left knee, right knee:  Left knee: Mild effusion within the suprapatellar pouch. Increased hyperemia surrounding the synovium. No cobblestoning surrounding the soft tissue of the knee. Normal-appearing medial joint space and lateral joint space Significant hyperemia at the inferior pole of the patella  Right knee: Mild effusion. Mild hyperemia of the suprapatellar pouch. Hyperlucency of the medial and lateral meniscus.  Large Baker's cyst with heterogenous matrix within the cyst itself.   Summary: Left knee with synovitis suggestive of gout and not infection.  Right knee with large Baker's cyst and possibly pseudogout contribution  Ultrasound and interpretation by Clare Gandy, MD   Aspiration/Injection Procedure Note Terry Burton 1961/03/23  Procedure: Aspiration Indications: Right knee pain  Procedure Details Consent: Risks of procedure as well as the alternatives and risks of each were explained to the (patient/caregiver).  Consent for procedure obtained. Time Out: Verified patient identification, verified procedure, site/side was marked, verified correct patient  position, special equipment/implants available, medications/allergies/relevent history reviewed, required imaging and test results available.  Performed.  The area was cleaned with iodine and alcohol swabs.    The right Baker's cyst was injected using 4 cc's of 1% lidocaine with a 25 1 1/2" needle.  An 18-gauge 1-1/2 inch needle was used to achieve aspiration.  Ultrasound was used. Images were obtained in long views showing the injection.    Amount of Fluid Aspirated: 40mL Character of Fluid: red colored Fluid was sent for:n/a  A sterile dressing was applied.  Patient did tolerate procedure well.   Aspiration/Injection Procedure Note Terry Burton 02-23-1961  Procedure: Injection Indications: right knee pain   Procedure Details Consent: Risks of procedure as well as the alternatives and risks of each were explained to the (patient/caregiver).  Consent for procedure obtained. Time Out: Verified patient identification, verified procedure, site/side was marked, verified correct patient position, special equipment/implants available, medications/allergies/relevent history reviewed, required imaging and test results available.  Performed.  The area was cleaned with iodine and alcohol swabs.    The right knee superior lateral suprapatellar pouch was injected using 1 cc's of 40 mg Kenalog and 4 cc's of 0.25% bupivacaine with a 22 1 1/2" needle.  Ultrasound was used. Images were obtained in long views showing the injection.     A sterile dressing was applied.  Patient did tolerate procedure well.   Aspiration/Injection Procedure Note Terry Burton 05-Mar-1961  Procedure: Injection Indications: Left knee pain  Procedure Details Consent: Risks of procedure as well as the alternatives and risks of each were explained to the (patient/caregiver).  Consent for procedure obtained. Time Out: Verified patient identification, verified procedure, site/side was marked, verified correct patient  position, special equipment/implants available, medications/allergies/relevent history reviewed, required imaging and test results available.  Performed.  The area was cleaned with iodine and alcohol swabs.    The Left knee superior lateral suprapatellar pouch was injected using 1 cc's of 40 mg Kenalog and 4 cc's of 0.25% bupivacaine with a 22 1 1/2" needle.  Ultrasound was used. Images were obtained in long views showing the injection.     A sterile dressing was applied.  Patient did tolerate procedure well.     ASSESSMENT & PLAN:   Baker's cyst of knee, right Has a large complex cyst in the right side.  Unable to aspirate a large amount due to the complex nature. -Counseled supportive care. -Counseled on compression and heat. -Aspiration today. -Could consider irrigation.  Acute idiopathic gout of left knee Redness warmth and hyperemia on ultrasound to suggest gout.  No changes in the soft tissue to demonstrate an infectious cause.  Seems less likely for an infected joint.  Did have a slight leukocytosis but presented in similar fashion with previous gout flares. -Counseled on home exercise therapy and supportive care. -Injection today. -If no improvement would consider aspiration -May need consider uric acid and other inflammatory  lab work.  Is currently on allopurinol.  Pseudogout of right knee Does have hyperlucencies within the meniscus on the right.  Unclear if this is gout deposition or compartment pseudogout. -Counseled on home exercise therapy and supportive care. -Injection today. -Could consider aspiration

## 2020-10-09 ENCOUNTER — Ambulatory Visit: Payer: BC Managed Care – PPO | Admitting: Family Medicine

## 2020-10-10 ENCOUNTER — Other Ambulatory Visit: Payer: Self-pay

## 2020-10-10 ENCOUNTER — Ambulatory Visit (INDEPENDENT_AMBULATORY_CARE_PROVIDER_SITE_OTHER): Payer: BC Managed Care – PPO | Admitting: Family Medicine

## 2020-10-10 ENCOUNTER — Encounter: Payer: Self-pay | Admitting: Family Medicine

## 2020-10-10 VITALS — BP 118/82 | Ht 73.0 in | Wt 202.0 lb

## 2020-10-10 DIAGNOSIS — M10062 Idiopathic gout, left knee: Secondary | ICD-10-CM | POA: Diagnosis not present

## 2020-10-10 DIAGNOSIS — M7121 Synovial cyst of popliteal space [Baker], right knee: Secondary | ICD-10-CM

## 2020-10-10 NOTE — Assessment & Plan Note (Signed)
Has had improvement with the injection.  Does seem to have an underlying gouty source. -Counseled on home exercise therapy and supportive care. -Uric acid, ANA, sed rate and CRP

## 2020-10-10 NOTE — Progress Notes (Signed)
  Terry Burton - 60 y.o. male MRN [id number]  Date of birth: 1960/06/24  SUBJECTIVE:  Including CC & ROS.  No chief complaint on file.   Terry Burton is a 60 y.o. male that is following up for his left knee pain and right knee pain and Baker's cyst.  The injections and aspiration have improved his symptoms.  Still having mild intermittent symptoms from time to time.   Review of Systems See HPI   HISTORY: Past Medical, Surgical, Social, and Family History Reviewed & Updated per EMR.   Pertinent Historical Findings include:  Past Medical History:  Diagnosis Date  . Arthritis    right hip and back with pain down right leg  . Gout     Past Surgical History:  Procedure Laterality Date  . FRACTURE SURGERY     repair right elbow years ago- no retained hardware  . TOTAL HIP ARTHROPLASTY  05/01/2011   Procedure: TOTAL HIP ARTHROPLASTY ANTERIOR APPROACH;  Surgeon: Kathryne Hitch;  Location: [facility];  Service: Orthopedics;  Laterality: Right;    History reviewed. No pertinent family history.  Social History   Socioeconomic History  . Marital status: Married    Spouse name: Not on file  . Number of children: Not on file  . Years of education: Not on file  . Highest education level: Not on file  Occupational History  . Not on file  Tobacco Use  . Smoking status: Current Some Day Smoker    Packs/day: 2.00    Years: 10.00    Pack years: 20.00    Types: Cigars    Last attempt to quit: 04/27/1998    Years since quitting: 22.4  . Smokeless tobacco: Never Used  Substance and Sexual Activity  . Alcohol use: Yes    Alcohol/week: 6.0 standard drinks    Types: 6 Cans of beer per week    Comment: occ  . Drug use: No  . Sexual activity: Not on file  Other Topics Concern  . Not on file  Social History Narrative  . Not on file   Social Determinants of Health   Financial Resource Strain: Not on file  Food Insecurity: Not on file  Transportation Needs: Not on file   Physical Activity: Not on file  Stress: Not on file  Social Connections: Not on file  Intimate Partner Violence: Not on file     PHYSICAL EXAM:  VS: BP 118/82 (BP Location: Left Arm, Patient Position: Sitting, Cuff Size: Large)   Ht 6\' 1"  (1.854 m)   Wt 202 lb (91.6 kg)   BMI 26.65 kg/m  Physical Exam Gen: NAD, alert, cooperative with exam, well-appearing MSK:  Right and left knee: No significant effusion. Small Baker's cyst of the right knee. Normal range of motion. Neurovascular intact   ASSESSMENT & PLAN:   Acute idiopathic gout of left knee Has had improvement with the injection.  Does seem to have an underlying gouty source. -Counseled on home exercise therapy and supportive care. -Uric acid, ANA, sed rate and CRP   Baker's cyst of knee, right Baker's cyst has significantly improved.  Small section of the Baker's cyst still occurring. -Counseled on home exercise therapy and supportive care. -Could consider aspiration if needed.

## 2020-10-10 NOTE — Assessment & Plan Note (Signed)
Baker's cyst has significantly improved.  Small section of the Baker's cyst still occurring. -Counseled on home exercise therapy and supportive care. -Could consider aspiration if needed.

## 2020-10-10 NOTE — Patient Instructions (Signed)
Good to see you Please try heat and compression on the right knee.  I will call with the results.   Please send me a message in MyChart with any questions or updates.  Please see me back as needed.   --Dr. Jordan Likes

## 2020-10-12 LAB — ANA,IFA RA DIAG PNL W/RFLX TIT/PATN
ANA Titer 1: NEGATIVE
Cyclic Citrullin Peptide Ab: 3 units (ref 0–19)
Rheumatoid fact SerPl-aCnc: 10.3 IU/mL (ref <14.0)

## 2020-10-12 LAB — URIC ACID: Uric Acid: 5.5 mg/dL (ref 3.8–8.4)

## 2020-10-12 LAB — C-REACTIVE PROTEIN: CRP: <1 mg/L (ref 0–10)

## 2020-10-12 LAB — SEDIMENTATION RATE: Sed Rate: 6 mm/hr (ref 0–30)

## 2020-10-14 ENCOUNTER — Telehealth: Payer: Self-pay | Admitting: Family Medicine

## 2020-10-14 NOTE — Telephone Encounter (Signed)
Informed of results.   Myra Rude, MD [facility] 10/14/2020, 10:35 AM

## 2020-10-18 ENCOUNTER — Telehealth: Payer: Self-pay | Admitting: Family Medicine

## 2020-10-18 MED ORDER — INDOMETHACIN 25 MG PO CAPS: 25.0000 mg | ORAL_CAPSULE | Freq: Three times a day (TID) | ORAL | 1 refills | Status: DC | PRN

## 2020-10-18 NOTE — Telephone Encounter (Signed)
Patient called requested Dr.Schmitz contact him regarding his request for a specific Rx

## 2020-10-18 NOTE — Telephone Encounter (Signed)
Refilled his indocin.   Myra Rude, MD [facility] 10/18/2020, 1:13 PM

## 2021-01-11 ENCOUNTER — Encounter (HOSPITAL_BASED_OUTPATIENT_CLINIC_OR_DEPARTMENT_OTHER): Payer: Self-pay | Admitting: Emergency Medicine

## 2021-01-11 ENCOUNTER — Other Ambulatory Visit: Payer: Self-pay

## 2021-01-11 ENCOUNTER — Emergency Department (HOSPITAL_BASED_OUTPATIENT_CLINIC_OR_DEPARTMENT_OTHER): Payer: BC Managed Care – PPO

## 2021-01-11 ENCOUNTER — Emergency Department (HOSPITAL_BASED_OUTPATIENT_CLINIC_OR_DEPARTMENT_OTHER)
Admission: EM | Admit: 2021-01-11 | Discharge: 2021-01-11 | Disposition: A | Discharge status: Home / Self Care | Payer: BC Managed Care – PPO | Attending: Emergency Medicine | Admitting: Emergency Medicine

## 2021-01-11 DIAGNOSIS — Z87891 Personal history of nicotine dependence: Secondary | ICD-10-CM | POA: Diagnosis not present

## 2021-01-11 DIAGNOSIS — Z96641 Presence of right artificial hip joint: Secondary | ICD-10-CM | POA: Insufficient documentation

## 2021-01-11 DIAGNOSIS — S20212A Contusion of left front wall of thorax, initial encounter: Secondary | ICD-10-CM | POA: Diagnosis not present

## 2021-01-11 DIAGNOSIS — W01198A Fall on same level from slipping, tripping and stumbling with subsequent striking against other object, initial encounter: Secondary | ICD-10-CM | POA: Insufficient documentation

## 2021-01-11 DIAGNOSIS — I10 Essential (primary) hypertension: Secondary | ICD-10-CM | POA: Diagnosis not present

## 2021-01-11 DIAGNOSIS — S299XXA Unspecified injury of thorax, initial encounter: Secondary | ICD-10-CM | POA: Diagnosis present

## 2021-01-11 MED ORDER — IBUPROFEN 800 MG PO TABS
800.0000 mg | ORAL_TABLET | Freq: Once | ORAL | Status: AC
  Administered 2021-01-11: 800 mg via ORAL
  Filled 2021-01-11: qty 1

## 2021-01-11 MED ORDER — IBUPROFEN 800 MG PO TABS
800.0000 mg | ORAL_TABLET | Freq: Three times a day (TID) | ORAL | 0 refills | Status: DC | PRN
Start: 2021-01-11 — End: 2021-08-16

## 2021-01-11 NOTE — ED Provider Notes (Signed)
[facility] EMERGENCY DEPARTMENT Provider Note   CSN: [id number] Arrival date & time: 01/11/21  0423     History Chief Complaint  Patient presents with   Terry Burton is a 60 y.o. male.  The history is provided by the patient.  Fall Terry Burton is a 60 y.o. male who presents to the Emergency Department complaining of rib pain.  He presents to the ED for evaluation of pain to the ribs on the left side.  On Monday he was turning and fell, striking the arm of a chair.  He experienced immediate pain to his chest wall.  Pain is present with movement, coughing.    No fever.  Has mild sob with cough.  His abdomen is not hurting but does feel slightly swollen.  No nausea/vomiting.  No change in appetite.    Has HTN, gout.       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/01/2011   Procedure: TOTAL HIP ARTHROPLASTY ANTERIOR APPROACH;  Surgeon: Kathryne Hitch;  Location: [facility];  Service: Orthopedics;  Laterality: Right;       Family History  Problem Relation Age of Onset   Cancer Father     Social History   Tobacco Use   Smoking status: Former    Packs/day: 2.00    Years: 10.00    Pack years: 20.00    Types: Cigars, Cigarettes    Quit date: 04/27/1998    Years since quitting: 22.7   Smokeless tobacco: Never  Vaping Use   Vaping Use: Never used  Substance Use Topics   Alcohol use: Yes     Alcohol/week: 6.0 standard drinks    Types: 6 Cans of beer per week    Comment: occ   Drug use: No    Home Medications Prior to Admission medications   Medication Sig Start Date End Date Taking? Authorizing Provider  ibuprofen (ADVIL) 800 MG tablet Take 1 tablet (800 mg total) by mouth every 8 (eight) hours as needed. 01/11/21  Yes Tilden Fossa, MD  ALLOPURINOL PO Take by mouth.    [provider]  amLODipine (NORVASC) 2.5 MG tablet Take 1 tablet by mouth daily. 03/11/20   [provider]  colchicine 0.6 MG tablet Take 1 tablet (0.6 mg total) by mouth daily. Take 1.2 mg in the morning and 0.6 mg in the evening of the first day, then take 0.6 mg daily until symptoms resolve 09/30/20 10/30/20  Liberty Handy, PA-C  geriatric multivitamins-minerals (ELDERTONIC/GEVRABON) ELIX Take 15 mLs by mouth daily.    [provider]  HYDROcodone-acetaminophen (NORCO/VICODIN) 5-325 MG tablet Take 1-2 tablets by mouth every 6 (six) hours as needed. 04/22/16   Mesner, Barbara Cower, MD  HYDROcodone-acetaminophen (NORCO/VICODIN) 5-325 MG tablet Take 1-2 tablets by mouth every 4 (four) hours as  needed for moderate pain or severe pain. 01/10/17   Arby Barrette, MD  indomethacin (INDOCIN) 25 MG capsule Take 1 capsule (25 mg total) by mouth 3 (three) times daily as needed. 10/18/20   Myra Rude, MD  methocarbamol (ROBAXIN) 500 MG tablet Take 1 tablet (500 mg total) by mouth every 8 (eight) hours as needed for muscle spasms. 12/10/16   Kathryne Hitch, MD    Allergies    Patient has no known allergies.  Review of Systems   Review of Systems  All other systems reviewed and are negative.  Physical Exam Updated Vital Signs BP 136/88 (BP Location: Right Arm)   Pulse 72   Temp 97.9 F (36.6 C) (Oral)   Resp 16   Ht 6\' 1"  (1.854 m)   Wt 88.9 kg   SpO2 98%   BMI 25.86 kg/m   Physical Exam Vitals and nursing note reviewed.  Constitutional:      Appearance: He is  well-developed.  HENT:     Head: Normocephalic and atraumatic.  Cardiovascular:     Rate and Rhythm: Normal rate and regular rhythm.     Heart sounds: No murmur heard. Pulmonary:     Effort: Pulmonary effort is normal. No respiratory distress.     Breath sounds: Normal breath sounds.  Chest:     Chest wall: Tenderness present.  Abdominal:     Palpations: Abdomen is soft.     Tenderness: There is no abdominal tenderness. There is no guarding or rebound.  Musculoskeletal:        General: No swelling or tenderness.  Skin:    General: Skin is warm and dry.  Neurological:     Mental Status: He is alert and oriented to person, place, and time.  Psychiatric:        Behavior: Behavior normal.    ED Results / Procedures / Treatments   Labs (all labs ordered are listed, but only abnormal results are displayed) Labs Reviewed - No data to display  EKG None  Radiology DG Ribs Unilateral W/Chest Left  Result Date: 01/11/2021 CLINICAL DATA:  Fall, rib pain EXAM: LEFT RIBS AND CHEST - 3+ VIEW COMPARISON:  None. FINDINGS: No fracture or other bone lesions are seen involving the ribs. There is no evidence of pneumothorax or pleural effusion. Both lungs are clear. Heart size and mediastinal contours are within normal limits. IMPRESSION: No thoracic trauma.  No rib fracture or pneumothorax. Electronically Signed   By: 01/13/2021 M.D.   On: 01/11/2021 05:48    Procedures Procedures   Medications Ordered in ED Medications  ibuprofen (ADVIL) tablet 800 mg (has no administration in time range)    ED Course  I have reviewed the triage vital signs and the nursing notes.  Pertinent labs & imaging results that were available during my care of the patient were reviewed by me and considered in my medical decision making (see chart for details).    MDM Rules/Calculators/A&P                          patient here for evaluation of left-sided chest wall pain after he hit it on Monday. Pain  is reproducible on examination. Imaging is negative for acute fracture pneumothorax. Discussed with patient home care for chest wall contusion with outpatient follow-up and return precautions.  Final Clinical Impression(s) / ED Diagnoses Final diagnoses:  Contusion of left chest wall, initial encounter    Rx /  DC Orders ED Discharge Orders          Ordered    ibuprofen (ADVIL) 800 MG tablet  Every 8 hours PRN        01/11/21 0601             Tilden Fossa, MD 01/11/21 [phone]

## 2021-01-11 NOTE — ED Triage Notes (Signed)
Pt states he tripped and fell over a recliner on Monday  Pt is c/o left rib pain

## 2021-08-16 ENCOUNTER — Emergency Department (HOSPITAL_BASED_OUTPATIENT_CLINIC_OR_DEPARTMENT_OTHER)
Admission: EM | Admit: 2021-08-16 | Discharge: 2021-08-16 | Disposition: A | Discharge status: Home / Self Care | Payer: BC Managed Care – PPO | Attending: Emergency Medicine | Admitting: Emergency Medicine

## 2021-08-16 ENCOUNTER — Encounter (HOSPITAL_BASED_OUTPATIENT_CLINIC_OR_DEPARTMENT_OTHER): Payer: Self-pay

## 2021-08-16 ENCOUNTER — Other Ambulatory Visit: Payer: Self-pay

## 2021-08-16 DIAGNOSIS — R051 Acute cough: Secondary | ICD-10-CM | POA: Diagnosis not present

## 2021-08-16 DIAGNOSIS — R059 Cough, unspecified: Secondary | ICD-10-CM | POA: Diagnosis present

## 2021-08-16 DIAGNOSIS — R0981 Nasal congestion: Secondary | ICD-10-CM | POA: Diagnosis not present

## 2021-08-16 DIAGNOSIS — Z20822 Contact with and (suspected) exposure to covid-19: Secondary | ICD-10-CM | POA: Diagnosis not present

## 2021-08-16 LAB — GROUP A STREP BY PCR: Group A Strep by PCR: NOT DETECTED

## 2021-08-16 LAB — RESP PANEL BY RT-PCR (FLU A&B, COVID) ARPGX2
Influenza A by PCR: NEGATIVE
Influenza B by PCR: NEGATIVE
SARS Coronavirus 2 by RT PCR: NEGATIVE

## 2021-08-16 MED ORDER — AZITHROMYCIN 250 MG PO TABS: ORAL_TABLET | ORAL | 0 refills | Status: DC

## 2021-08-16 MED ORDER — BENZONATATE 100 MG PO CAPS: 100.0000 mg | ORAL_CAPSULE | Freq: Three times a day (TID) | ORAL | 0 refills | Status: DC

## 2021-08-16 NOTE — ED Triage Notes (Signed)
Patient reports nasal congestion and cough for several days, patient reports green/yellow sputum with cough. Patient states he takes benadryl and cough syrup at home but has not taken any this morning. Denise N/V/D, chest pain or shortness of breath.  ?

## 2021-08-16 NOTE — ED Provider Notes (Signed)
?[facility] EMERGENCY DEPARTMENT ?Provider Note ? ? ?CSN: [id number] ?Arrival date & time: 08/16/21  0502 ? ?  ? ?History ? ?Chief Complaint  ?Patient presents with  ? Nasal Congestion  ? ? ?Terry Burton is a 61 y.o. male. ? ?The history is provided by the patient.  ?Cough ?Cough characteristics:  Productive ?Sputum characteristics:  Chilton Si ?Severity:  Moderate ?Duration:  1 week ?Timing:  Intermittent ?Progression:  Worsening ?Chronicity:  New ?Smoker: no   ?Relieved by:  Nothing ?Worsened by:  Nothing ?Associated symptoms: chills, rhinorrhea, sinus congestion and sore throat   ?Associated symptoms: no chest pain, no fever and no shortness of breath   ?Patient reports cough, congestion and sore throat.  It has been ongoing about a week.  He has has been taking OTC meds and allergy meds without relief ?  ? ?Home Medications ?Prior to Admission medications   ?Medication Sig Start Date End Date Taking? Authorizing Provider  ?ALLOPURINOL PO Take by mouth.    [provider]  ?amLODipine (NORVASC) 2.5 MG tablet Take 1 tablet by mouth daily. 03/11/20   [provider]  ?azithromycin (ZITHROMAX Z-PAK) 250 MG tablet Take 2 tablets PO on day one, then take one tablet daily for 4 days 08/16/21  Yes Zadie Rhine, MD  ?benzonatate (TESSALON) 100 MG capsule Take 1 capsule (100 mg total) by mouth every 8 (eight) hours. 08/16/21  Yes Zadie Rhine, MD  ?geriatric multivitamins-minerals (ELDERTONIC/GEVRABON) ELIX Take 15 mLs by mouth daily.    [provider]  ?   ? ?Allergies    ?Bee pollen   ? ?Review of Systems   ?Review of Systems  ?Constitutional:  Positive for chills. Negative for fever.  ?HENT:  Positive for rhinorrhea and sore throat.   ?Respiratory:  Positive for cough. Negative for shortness of breath.   ?Cardiovascular:  Negative for chest pain.  ?Gastrointestinal:  Negative for vomiting.  ? ?Physical Exam ?Updated Vital Signs ?BP 134/86 (BP Location: Left Arm)   Pulse 75    Temp 98.1 ?F (36.7 ?C) (Oral)   Resp 16   Ht 1.854 m (6\' 1" )   Wt 90.3 kg   SpO2 98%   BMI 26.25 kg/m?  ?Physical Exam ?CONSTITUTIONAL: Well developed/well nourished ?HEAD: Normocephalic/atraumatic ?EYES: EOMI/PERRL ?ENMT: Mucous membranes moist, uvula midline, no erythema or exudates. ?NECK: supple no meningeal signs ?CV: S1/S2 noted, no murmurs/rubs/gallops noted ?LUNGS: Lungs are clear to auscultation bilaterally, no apparent distress ?ABDOMEN: soft, nontender ?NEURO: Pt is awake/alert/appropriate, moves all extremitiesx4.  No facial droop.   ?EXTREMITIES: pulses normal/equal, full ROM ?SKIN: warm, color normal ?PSYCH: no abnormalities of mood noted, alert and oriented to situation ? ?ED Results / Procedures / Treatments   ?Labs ?(all labs ordered are listed, but only abnormal results are displayed) ?Labs Reviewed  ?GROUP A STREP BY PCR  ?RESP PANEL BY RT-PCR (FLU A&B, COVID) ARPGX2  ? ? ?EKG ?None ? ?Radiology ?No results found. ? ?Procedures ?Procedures  ? ? ?Medications Ordered in ED ?Medications - No data to display ? ?ED Course/ Medical Decision Making/ A&P ?Clinical Course as of 08/16/21 08/18/21  ?Sat Aug 16, 2021  ?Aug 18, 2021 Discussed lab findings with patient.  He request antibiotics as he reports that the only thing will help.  We will also provide 8546 [DW]  ?  ?Clinical Course User Index ?[DW] Jerilynn Som, MD  ? ?                        ?  Medical Decision Making ?Risk ?Prescription drug management. ? ? ?Patient presents with cough, sore throat and congestion for the past week.  He is in no distress.  This is an acute illness that requires work-up. ?6:33 AM ?Labs reassuring.  Patient resting comfortably.  No hypoxia.  No further work-up indicated at this time. ?Differential included COVID-19, influenza, pneumonia. ?We will start oral antibiotics at  patient request ? ? ? ? ? ? ? ?Final Clinical Impression(s) / ED Diagnoses ?Final diagnoses:  ?Acute cough  ?Nasal congestion  ? ? ?Rx / DC  Orders ?ED Discharge Orders   ? ?      Ordered  ?  azithromycin (ZITHROMAX Z-PAK) 250 MG tablet       ? 08/16/21 0629  ?  benzonatate (TESSALON) 100 MG capsule  Every 8 hours       ? 08/16/21 0629  ? ?  ?  ? ?  ? ? ?  ?Zadie Rhine, MD ?08/16/21 [phone] ? ?

## 2022-09-14 ENCOUNTER — Encounter: Payer: Self-pay | Admitting: *Deleted

## 2022-09-28 ENCOUNTER — Ambulatory Visit (INDEPENDENT_AMBULATORY_CARE_PROVIDER_SITE_OTHER): Payer: BC Managed Care – PPO | Admitting: Orthopaedic Surgery

## 2022-09-28 ENCOUNTER — Encounter: Payer: Self-pay | Admitting: Orthopaedic Surgery

## 2022-09-28 ENCOUNTER — Other Ambulatory Visit (INDEPENDENT_AMBULATORY_CARE_PROVIDER_SITE_OTHER): Payer: BC Managed Care – PPO

## 2022-09-28 DIAGNOSIS — M25552 Pain in left hip: Secondary | ICD-10-CM | POA: Diagnosis not present

## 2022-09-28 MED ORDER — METHYLPREDNISOLONE 4 MG PO TABS: ORAL_TABLET | ORAL | 0 refills | Status: DC

## 2022-09-28 NOTE — Progress Notes (Signed)
The patient is well-known to me.  He is a 62 year old gentleman who is 12 years out from a right total hip replacement.  He said that right hip is done well but about a week ago he has developed some popping and groin pain on his left side.  He does have a history of gout and has had some wrist swelling as well but he said things are calming down now with no known injury.  He was having a burning sensation in his left leg as well.  That is also subsided.  He is not a diabetic.  He is a thin individual.  He is a Advertising copywriter as well.  His right hip that we replaced in 2012 moves smoothly and fluidly.  His left hip does move smoothly and fluidly and has a little bit of tightness in the groin with extremes of rotation and some pain in the groin with rotation extreme.  An AP pelvis and lateral of the left hip does show worsening arthritis in the left hip when you compare this to films from 12 years ago.  There is now some slight flattening of the superior lateral femoral head and superolateral joint space narrowing.  Since its only been hurting him for about a week he will work on activity modification.  I will put him on a steroid taper given his history of gout.  If things worsen he knows to let us know.  All questions and concerns were answered and addressed.  Follow-up can be as needed unless things worsen.

## 2023-03-29 ENCOUNTER — Encounter (HOSPITAL_BASED_OUTPATIENT_CLINIC_OR_DEPARTMENT_OTHER): Payer: Self-pay | Admitting: Pediatrics

## 2023-03-29 ENCOUNTER — Emergency Department (HOSPITAL_BASED_OUTPATIENT_CLINIC_OR_DEPARTMENT_OTHER)
Admission: EM | Admit: 2023-03-29 | Discharge: 2023-03-29 | Disposition: A | Discharge status: Home / Self Care | Payer: BC Managed Care – PPO | Attending: Emergency Medicine | Admitting: Emergency Medicine

## 2023-03-29 ENCOUNTER — Emergency Department (HOSPITAL_BASED_OUTPATIENT_CLINIC_OR_DEPARTMENT_OTHER): Payer: BC Managed Care – PPO

## 2023-03-29 ENCOUNTER — Other Ambulatory Visit (HOSPITAL_BASED_OUTPATIENT_CLINIC_OR_DEPARTMENT_OTHER): Payer: Self-pay

## 2023-03-29 ENCOUNTER — Other Ambulatory Visit: Payer: Self-pay

## 2023-03-29 DIAGNOSIS — M25552 Pain in left hip: Secondary | ICD-10-CM | POA: Diagnosis not present

## 2023-03-29 DIAGNOSIS — M545 Low back pain, unspecified: Secondary | ICD-10-CM | POA: Diagnosis not present

## 2023-03-29 DIAGNOSIS — S5002XA Contusion of left elbow, initial encounter: Secondary | ICD-10-CM | POA: Diagnosis not present

## 2023-03-29 DIAGNOSIS — W19XXXA Unspecified fall, initial encounter: Secondary | ICD-10-CM | POA: Insufficient documentation

## 2023-03-29 DIAGNOSIS — S59902A Unspecified injury of left elbow, initial encounter: Secondary | ICD-10-CM | POA: Diagnosis present

## 2023-03-29 MED ORDER — ACETAMINOPHEN 325 MG PO TABS
650.0000 mg | ORAL_TABLET | Freq: Once | ORAL | Status: AC
  Administered 2023-03-29: 650 mg via ORAL
  Filled 2023-03-29: qty 2

## 2023-03-29 MED ORDER — CYCLOBENZAPRINE HCL 10 MG PO TABS
10.0000 mg | ORAL_TABLET | Freq: Two times a day (BID) | ORAL | 0 refills | Status: DC | PRN
  Filled 2023-03-29: qty 20, 10d supply, fill #0

## 2023-03-29 MED ORDER — METHYLPREDNISOLONE 4 MG PO TBPK
ORAL_TABLET | ORAL | 0 refills | Status: DC
  Filled 2023-03-29: qty 21, 6d supply, fill #0

## 2023-03-29 MED ORDER — LIDOCAINE 5 % EX PTCH
1.0000 | MEDICATED_PATCH | CUTANEOUS | 0 refills | Status: DC
  Filled 2023-03-29: qty 30, 30d supply, fill #0

## 2023-03-29 MED ORDER — LIDOCAINE 5 % EX PTCH
1.0000 | MEDICATED_PATCH | CUTANEOUS | Status: DC
  Administered 2023-03-29: 1 via TRANSDERMAL
  Filled 2023-03-29: qty 1

## 2023-03-29 NOTE — ED Provider Notes (Signed)
3:03 PM Assumed care of patient from off-going team. For more details, please see note from same day.  In brief, this is a 62 y.o. male who p/w LBP, L hip/elbow pain after fall 3 days ago. Dock collapsed on Saturday.   Plan/Dispo at time of sign-out & ED Course since sign-out: [ ]  XRs. Likely contusion. RX for flexeril and medrol dose pack.   BP (!) 144/95 (BP Location: Right Arm)   Pulse 75   Temp 98.4 F (36.9 C) (Oral)   Resp 16   Ht 6' (1.829 m)   Wt 88.5 kg   SpO2 100%   BMI 26.45 kg/m    ED Course:   Clinical Course as of 03/29/23 1644  Mon Mar 29, 2023  1616 XR L hip, L elbow, and lumbar spine unremarkable. DC w/ discharge instructions/return precautions. [HN]    Clinical Course User Index [HN] Loetta Rough, MD    ------------------------------- Vivi Barrack, MD Emergency Medicine  This note was created using dictation software, which may contain spelling or grammatical errors.   Loetta Rough, MD 03/29/23 [phone]

## 2023-03-29 NOTE — ED Triage Notes (Signed)
Report injury from Saturday when the pier collapse when he was fishing, c/o back and left elbow pain. Denies LOC and or head injury.

## 2023-03-29 NOTE — Discharge Instructions (Addendum)
Overall x-rays are unremarkable.  I suspect that you have bone bruise/contusions.  Continue Tylenol for pain.  Use lidocaine patches as well.  I prescribed you a muscle relaxant called Flexeril to use as needed as well.  This medication is sedating so please be careful with its use.  I have also prescribed anti-inflammatory called Medrol Dosepak to help as well.  Follow-up with your primary care doctor if symptoms are not improving.

## 2023-03-29 NOTE — ED Provider Notes (Signed)
[place] EMERGENCY DEPARTMENT AT [facility] Provider Note   CSN: [id number] Arrival date & time: 03/29/23  1231     History  Chief Complaint  Patient presents with   Back Pain    Terry Burton is a 62 y.o. male.  Patient here with low back pain, left hip pain, left elbow pain after a fall 3 days ago.  Patient was on a fishing dock that collapsed.  Did not hit his head or lose consciousness.  He is not on blood thinners.  He has been ambulatory.  Over-the-counter medication has not helped much.  He was having some swelling to his left elbow that actually has now improved but still having some discomfort in these areas.  He denies any loss of bowel or bladder.  No weakness numbness tingling.  Denies any other pain elsewhere.  The history is provided by the patient.       Home Medications Prior to Admission medications   Medication Sig Start Date End Date Taking? Authorizing Provider  cyclobenzaprine (FLEXERIL) 10 MG tablet Take 1 tablet (10 mg total) by mouth 2 (two) times daily as needed for muscle spasms. 03/29/23  Yes Danyela Posas, DO  lidocaine (LIDODERM) 5 % Place 1 patch onto the skin daily. Remove & Discard patch within 12 hours or as directed by MD 03/29/23  Yes Laurann Mcmorris, DO  methylPREDNISolone (MEDROL DOSEPAK) 4 MG TBPK tablet Follow package insert 03/29/23  Yes Butler Vegh, DO  ALLOPURINOL PO Take by mouth.    [provider]  amLODipine (NORVASC) 2.5 MG tablet Take 1 tablet by mouth daily. 03/11/20   [provider]  azithromycin (ZITHROMAX Z-PAK) 250 MG tablet Take 2 tablets PO on day one, then take one tablet daily for 4 days 08/16/21   Zadie Rhine, MD  benzonatate (TESSALON) 100 MG capsule Take 1 capsule (100 mg total) by mouth every 8 (eight) hours. 08/16/21   Zadie Rhine, MD  geriatric multivitamins-minerals (ELDERTONIC/GEVRABON) ELIX Take 15 mLs by mouth daily.    [provider]      Allergies    Bee  pollen    Review of Systems   Review of Systems  Physical Exam Updated Vital Signs BP (!) 144/95 (BP Location: Right Arm)   Pulse 75   Temp 98.4 F (36.9 C) (Oral)   Resp 16   Ht 6' (1.829 m)   Wt 88.5 kg   SpO2 100%   BMI 26.45 kg/m  Physical Exam Vitals and nursing note reviewed.  Constitutional:      General: He is not in acute distress.    Appearance: He is well-developed. He is not ill-appearing.  HENT:     Head: Normocephalic and atraumatic.     Mouth/Throat:     Mouth: Mucous membranes are moist.  Eyes:     Extraocular Movements: Extraocular movements intact.     Conjunctiva/sclera: Conjunctivae normal.     Pupils: Pupils are equal, round, and reactive to light.  Cardiovascular:     Rate and Rhythm: Normal rate and regular rhythm.     Pulses: Normal pulses.     Heart sounds: Normal heart sounds. No murmur heard. Pulmonary:     Effort: Pulmonary effort is normal. No respiratory distress.     Breath sounds: Normal breath sounds.  Abdominal:     Palpations: Abdomen is soft.     Tenderness: There is no abdominal tenderness.  Musculoskeletal:        General: Tenderness present.  No swelling.     Cervical back: Normal range of motion and neck supple. No tenderness.     Comments: Tenderness to the left elbow, left hip, left paraspinal lumbar muscles, no midline spinal tenderness, good range of motion of the left elbow, no obvious effusion  Skin:    General: Skin is warm and dry.     Capillary Refill: Capillary refill takes less than 2 seconds.  Neurological:     General: No focal deficit present.     Mental Status: He is alert and oriented to person, place, and time.     Cranial Nerves: No cranial nerve deficit.     Sensory: No sensory deficit.     Motor: No weakness.     Coordination: Coordination normal.  Psychiatric:        Mood and Affect: Mood normal.     ED Results / Procedures / Treatments   Labs (all labs ordered are listed, but only abnormal  results are displayed) Labs Reviewed - No data to display  EKG None  Radiology No results found.  Procedures Procedures    Medications Ordered in ED Medications  lidocaine (LIDODERM) 5 % 1 patch (1 patch Transdermal Patch Applied 03/29/23 1258)  acetaminophen (TYLENOL) tablet 650 mg (650 mg Oral Given 03/29/23 1257)    ED Course/ Medical Decision Making/ A&P                                 Medical Decision Making Amount and/or Complexity of Data Reviewed Radiology: ordered.  Risk OTC drugs. Prescription drug management.   Terry Burton is here with pain after fall.  Normal vitals.  No fever.  Fall 3 days ago when fishing pier collapsed.  Not on blood thinners.  Did not hit his head or lose consciousness.  Is having pain in his left elbow, low back, left hip.  These areas clinically appear well.  I have low suspicion for fracture but will get x-rays of these areas.  Overall I suspect contusions.  He is neurovascular neuromuscular intact.  He has no midline spinal tenderness.  Will give lidocaine patch and Tylenol.  Likely to prescribe lidocaine patch, Flexeril, Medrol Dosepak.  Per my review and interpretation of x-rays there is no acute fracture or malalignment.  Overall I suspect contusions.  Will prescribe lidocaine patch, Flexeril, Medrol Dosepak.  Recommend follow-up primary care doctor.  Told to return if symptoms worsen.  This chart was dictated using voice recognition software.  Despite best efforts to proofread,  errors can occur which can change the documentation meaning.         Final Clinical Impression(s) / ED Diagnoses Final diagnoses:  Acute low back pain without sciatica, unspecified back pain laterality  Contusion of left elbow, initial encounter  Left hip pain    Rx / DC Orders ED Discharge Orders          Ordered    cyclobenzaprine (FLEXERIL) 10 MG tablet  2 times daily PRN        03/29/23 1335    lidocaine (LIDODERM) 5 %  Every 24 hours         03/29/23 1335    methylPREDNISolone (MEDROL DOSEPAK) 4 MG TBPK tablet        03/29/23 1336              Joan Herschberger, DO 03/29/23 1337

## 2023-04-12 ENCOUNTER — Ambulatory Visit: Payer: BC Managed Care – PPO | Admitting: Physician Assistant

## 2023-05-03 ENCOUNTER — Encounter: Payer: Self-pay | Admitting: Orthopaedic Surgery

## 2023-05-03 ENCOUNTER — Ambulatory Visit (INDEPENDENT_AMBULATORY_CARE_PROVIDER_SITE_OTHER): Payer: BC Managed Care – PPO | Admitting: Orthopaedic Surgery

## 2023-05-03 DIAGNOSIS — M5442 Lumbago with sciatica, left side: Secondary | ICD-10-CM

## 2023-05-03 DIAGNOSIS — M25552 Pain in left hip: Secondary | ICD-10-CM | POA: Diagnosis not present

## 2023-05-03 NOTE — Progress Notes (Signed)
The patient is well-known to me.  He is 62 years old.  He was involved in an accident where a dock that he was fishing off of collapsed recently.  He was eventually seen at [facility] and had x-rays of the lumbar spine and his pelvis and left hip on October 28 of this year.  He still complains of oblique pain to the left side and lower back pain the left side.  He denies any groin pain.  He does have some radicular symptoms going down his left leg.  The [place] when it collapsed.  He denies any change in bowel or bladder function or weakness in his legs.  On exam he does seem to have a positive straight leg raise to the left side.  He has 5 out of 5 strength of his left and right lower extremity.  His left hip moves smoothly and fluidly and there is no blocks to rotation around the left hip.  He has good flexion and extension of his lumbar spine but it is painful.  X-rays from last month of the lumbar spine show degenerative disc disease and degenerative changes of the lower lumbar spine between L5 and S1.  There are osteophytes anteriorly and arthritic changes in the posterior elements.  An AP pelvis and lateral left hip shows no acute findings.  There is slight lateral flattening of the femoral head that we saw on previous x-rays.  He is asymptomatic with the left hip.  At this point I would like to send him to outpatient physical therapy so they can work on his low back and the muscle strain to his left oblique muscles.  We will then see him back in 6 weeks to see if he is recovering from this.  All question concerns were answered and addressed.

## 2023-05-04 ENCOUNTER — Other Ambulatory Visit: Payer: Self-pay | Admitting: Radiology

## 2023-05-04 DIAGNOSIS — M25552 Pain in left hip: Secondary | ICD-10-CM

## 2023-05-04 DIAGNOSIS — M5442 Lumbago with sciatica, left side: Secondary | ICD-10-CM

## 2023-05-13 IMAGING — DX DG RIBS W/ CHEST 3+V*L*
5 series · 5 of 5 positions shown · non-contrast
Comparison: None.

CLINICAL DATA: Fall, rib pain

EXAM:
LEFT RIBS AND CHEST - 3+ VIEW

[chest pa]
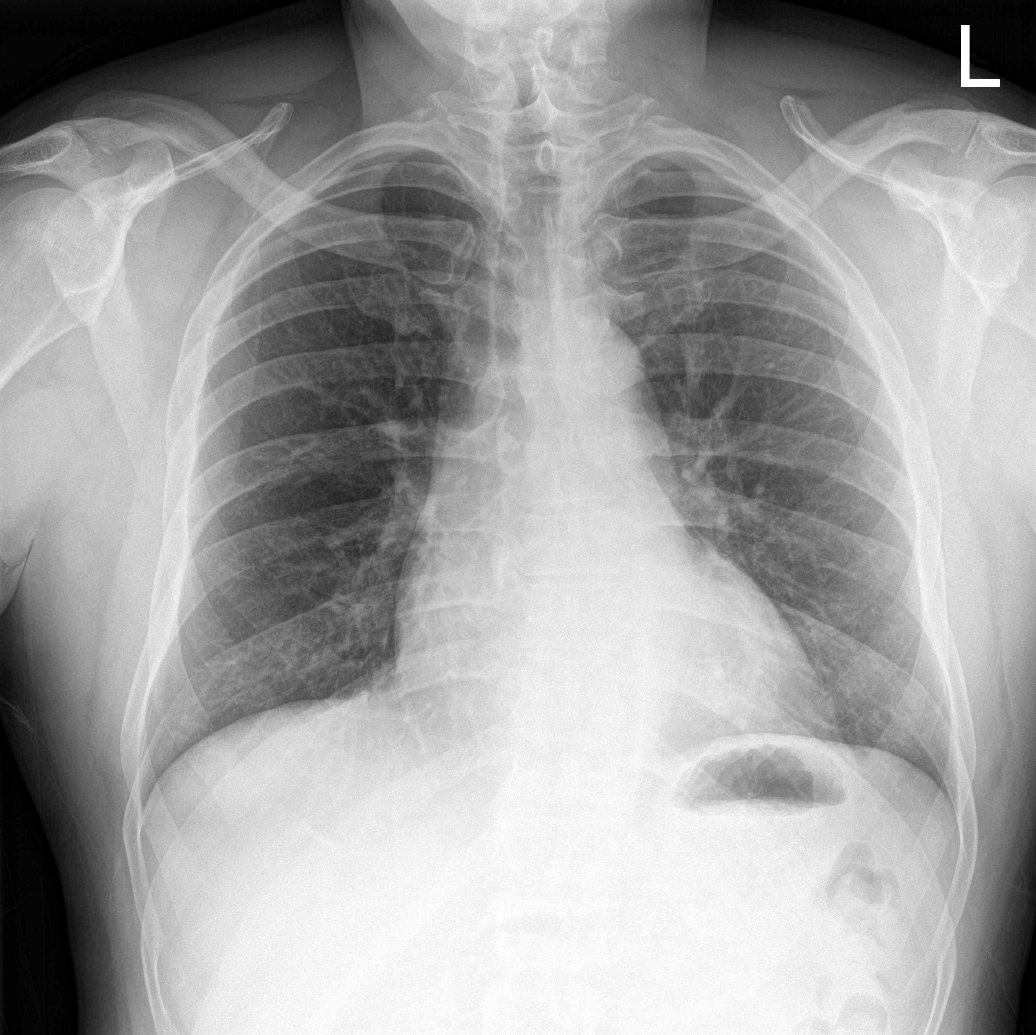

[rib ap (1 of 2)]
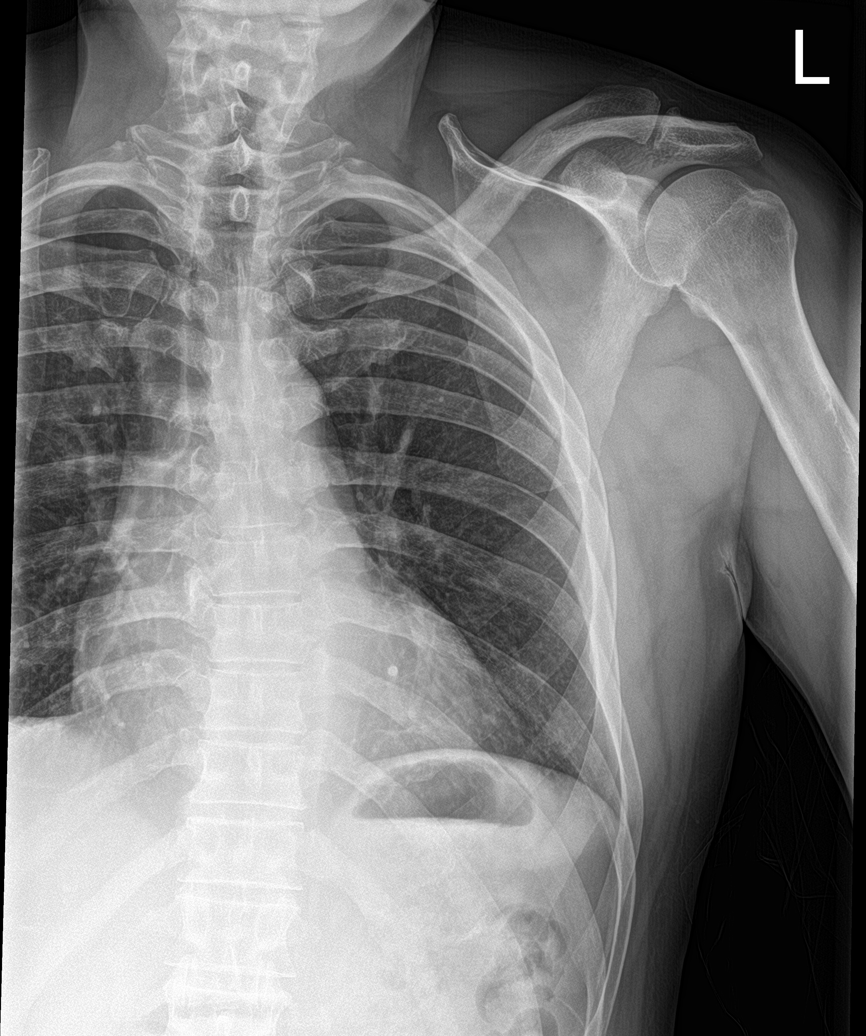

[rib ap obl (1 of 2)]
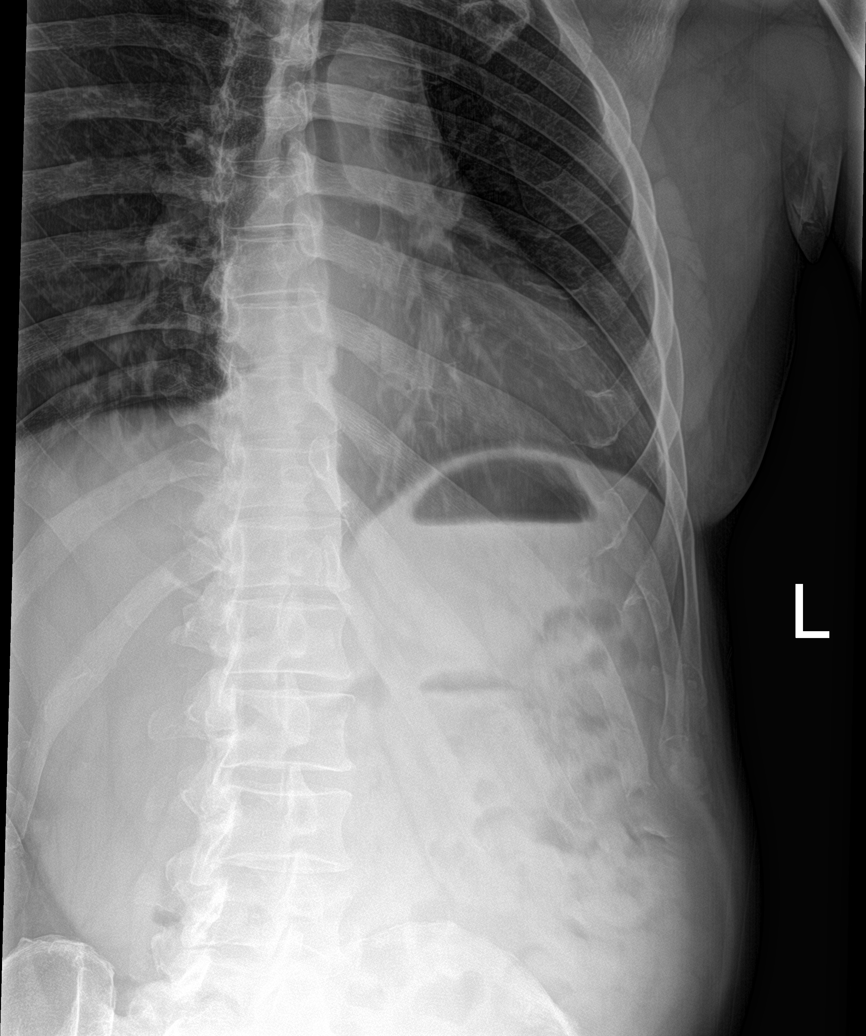

[rib ap (2 of 2)]
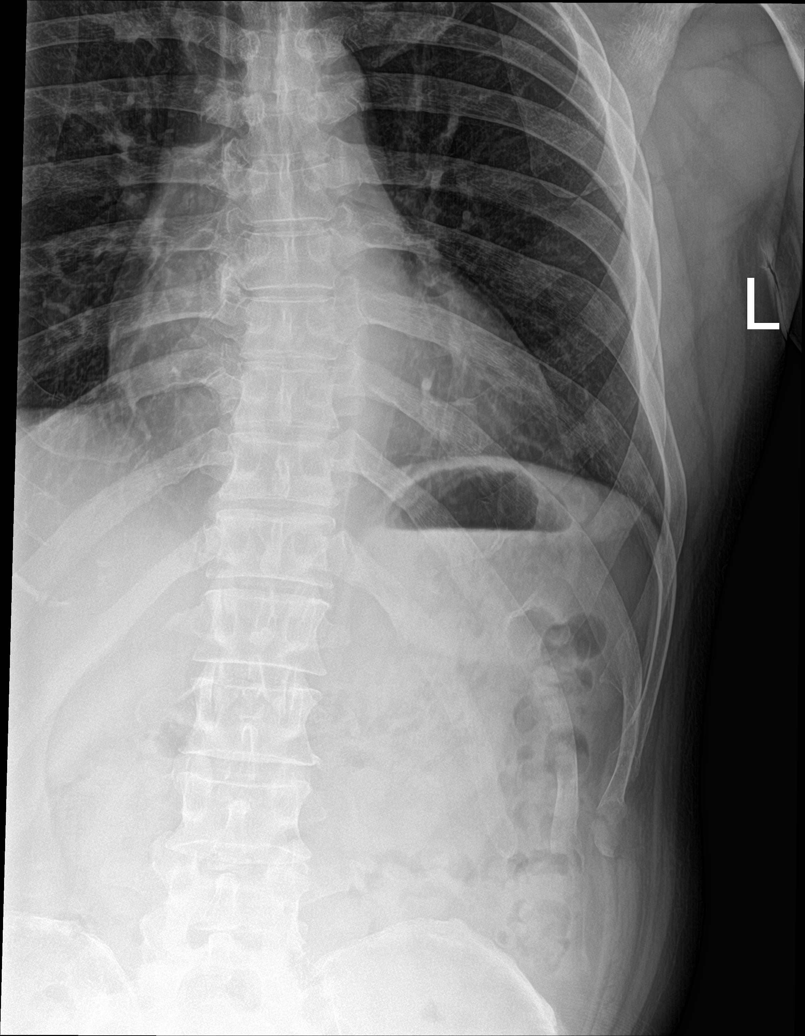

[rib ap obl (2 of 2)]
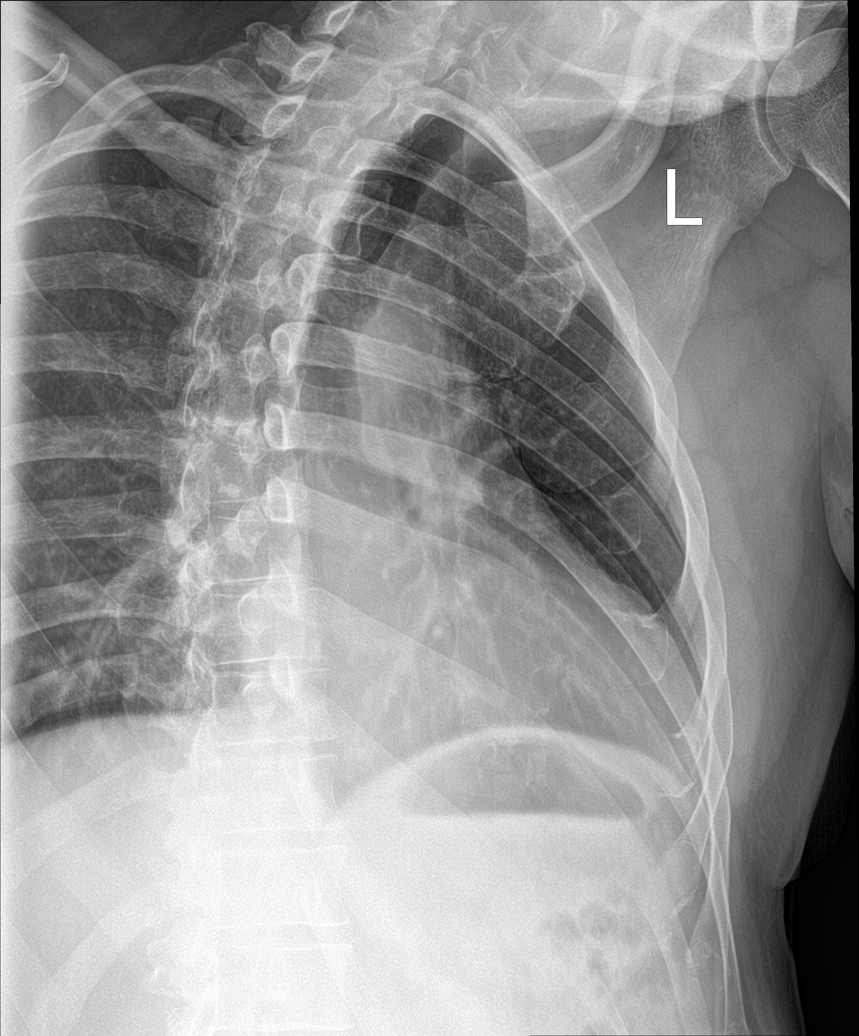

[5 of 5 positions shown; findings below may reference images not displayed]

FINDINGS: No fracture or other bone lesions are seen involving the ribs. There
is no evidence of pneumothorax or pleural effusion. Both lungs are
clear. Heart size and mediastinal contours are within normal limits.
IMPRESSION: No thoracic trauma.  No rib fracture or pneumothorax.

## 2023-05-21 NOTE — Therapy (Signed)
OUTPATIENT PHYSICAL THERAPY THORACOLUMBAR EVALUATION   Patient Name: ABDURRAHMAN SOUTHARD MRN: [id number] DOB:07-03-1960, 62 y.o., male Today's Date: 05/24/2023  END OF SESSION:  PT End of Session - 05/24/23 0812     Visit Number 1    Authorization Type BCBS    PT Start Time 0805    PT Stop Time 0845    PT Time Calculation (min) 40 min    Activity Tolerance Patient tolerated treatment well    Behavior During Therapy WFL for tasks assessed/performed             Past Medical History:  Diagnosis Date   Arthritis    right hip and back with pain down right leg   Gout    Past Surgical History:  Procedure Laterality Date   FRACTURE SURGERY     repair right elbow years ago- no retained hardware   TOTAL HIP ARTHROPLASTY  05/01/2011   Procedure: TOTAL HIP ARTHROPLASTY ANTERIOR APPROACH;  Surgeon: Kathryne Hitch;  Location: [facility];  Service: Orthopedics;  Laterality: Right;   Patient Active Problem List   Diagnosis Date Noted   Baker's cyst of knee, right 10/01/2020   Acute idiopathic gout of left knee 10/01/2020   Pseudogout of right knee 10/01/2020   Chronic pain of right knee 01/26/2017   Acute pain of right shoulder 12/10/2016   Right hip pain 12/10/2016   Hip arthritis 05/01/2011    PCP: Valinda Hoar, PA-C   REFERRING PROVIDER: Kathryne Hitch, MD   REFERRING DIAG: [phone] (ICD-10-CM) - Acute left-sided low back pain with left-sided sciatica   Rationale for Evaluation and Treatment: Rehabilitation  THERAPY DIAG:  Other low back pain  Cramp and spasm  ONSET DATE: 03/29/2023  SUBJECTIVE:                                                                                                                                                                                           SUBJECTIVE STATEMENT: Had an accident in October, went out around Midnight with family to fish and the pier collapsed, I was in the back so I only hurt my knee and back.  Had so  much pain left side of my back felt like it was my kidney.   He reports used to be very athletic, still plays golf.   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  Gets some tingling on left side going down left side of leg to above the ankle.   NEXT MD VISIT: 06/14/2023 with [name]  PERTINENT HISTORY:  From MD notes " He was involved  in an accident where a dock that he was fishing off of collapsed recently.  He was eventually seen at [facility] and had x-rays of the lumbar spine and his pelvis and left hip on October 28 of this year.  He still complains of oblique pain to the left side and lower back pain the left side.  He denies any groin pain.  He does have some radicular symptoms going down his left leg.    He denies any change in bowel or bladder function or weakness in his legs."  PMH: R THA  PAIN:  Are you having pain? Yes: NPRS scale: 7-8/10 Pain location:  between L side low back to hip Pain description: comes and goes, hurts  Aggravating factors: twitsting, certain movements, especially moving hip, golf Relieving factors: relaxing, tylenol  PRECAUTIONS: None  RED FLAGS: None   WEIGHT BEARING RESTRICTIONS: No  FALLS:  Has patient fallen in last 6 months? 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 The remainder of the disc spaces are maintained. No static listhesis. No spondylolysis.   Prior right total hip arthroplasty in stable alignment. There is no evidence of hip fracture or dislocation. The left femoral head is seated within the  acetabulum. Degenerative changes of the left hip with joint space narrowing. The sacroiliac joints and pubic symphysis are anatomically aligned.   PATIENT SURVEYS:  Modified Oswestry 13/50   COGNITION: Overall cognitive status: Within functional limits for tasks assessed     SENSATION: WFL  MUSCLE LENGTH: Hamstrings: Right 90 deg; Left 90 deg mild tightness bil   POSTURE: No Significant postural limitations  PALPATION: Tenderness L QL, L glute medius, L greater trochanter  LUMBAR ROM:   AROM eval  Flexion To ankles, p! left  Extension 75% limited, stiff  Right lateral flexion WNL  Left lateral flexion WNL, p!  Right rotation WNL, p!  Left rotation WNL   (Blank rows = not tested)  LOWER EXTREMITY ROM:      Tightness bilateral hips, history of R TKA, limited hip IR bilateally  LOWER EXTREMITY MMT:    MMT Right eval Left eval  Hip flexion 5 4p!  Hip extension    Hip abduction 5 5  Hip adduction 5 5  Knee flexion 5 5  Knee extension 5 5  Ankle dorsiflexion 5 5  Ankle plantarflexion     (Blank rows = not tested)  LUMBAR SPECIAL TESTS:  Straight leg raise test: Negative and Long sit test: Positive Negative retest after MET  FUNCTIONAL TESTS:  5 times sit to stand: 10.5 seconds, no UE assist, table height 20" ( to put hips/knees at 90/90)  GAIT: Distance walked: 37' Assistive device utilized: None Level of assistance: Complete Independence Comments: no deviation  TODAY'S TREATMENT:  DATE:   05/24/23 EVAL Therapeutic Exercise: to improve strength and mobility.  Demo, verbal and tactile cues throughout for technique. MET Isometric L hip extension 5 x 5 sec holds  - negative retest Demo -QL stretches for L side    PATIENT EDUCATION:  Education details: findings, POC including TrDN, initial HEP Person educated: Patient Education  method: Explanation, Demonstration, Verbal cues, and Handouts Education comprehension: verbalized understanding and returned demonstration  HOME EXERCISE PROGRAM: Access Code: [email or web address] URL: [email or web address] Date: 05/24/2023 Prepared by: Harrie Foreman  Exercises - Seated Quadratus Lumborum Stretch in Chair  - 1 x daily - 7 x weekly - 1 sets - 3 reps - 30 sec hold - Standing Quadratus Lumborum Stretch with Doorway  - 1 x daily - 7 x weekly - 1 sets - 3 reps - 30 sec hold   ASSESSMENT:  CLINICAL IMPRESSION: ADIR GUNNETT  is a 62 y.o. male who was seen today for physical therapy evaluation and treatment for left sided low back pain starting 03/29/23 after fall after pier collapsed.  Imaging was negative, today had negative SLR but positive supine to long sit test, corrected with muscle energy technique with resisted L hip extension, with negative retest.  Noted tenderness in L QL, L glutes and over L hip trochanter, but no pain in lumbar spine with PA mobs.  Discussed pain was mostly likely muscular, radicular symptoms may be due to referral patterns.  Only other deficit noted was had significant tightness in bilateral hips, especially internal rotation.  He does have history of R THA.  DOMANIC BOENDER is a good candidate for physical therapy and is expected to mae good progress with PT.  Today discussed TrDN which he was open too, and given initial HEP for QL stretches.     OBJECTIVE IMPAIRMENTS: decreased activity tolerance, decreased ROM, increased fascial restrictions, increased muscle spasms, impaired flexibility, and pain.   ACTIVITY LIMITATIONS: carrying, lifting, and bending  PARTICIPATION LIMITATIONS: driving, community activity, and occupation  PERSONAL FACTORS: Time since onset of injury/illness/exacerbation and 1 comorbidity: history R THA, gout  are also affecting patient's functional outcome.   REHAB POTENTIAL: Excellent  CLINICAL DECISION MAKING:  Stable/uncomplicated  EVALUATION COMPLEXITY: Low   GOALS: Goals reviewed with patient? Yes  SHORT TERM GOALS: Target date: 06/07/2023   Patient will be independent with initial HEP.  Baseline: given Goal status: INITIAL  2.   Patient will report centralization of radicular symptoms.  Baseline: radiates down LLE to above L ankle Goal status: INITIAL   LONG TERM GOALS: Target date: 07/19/2023   Patient will be independent with advanced/ongoing HEP to improve outcomes and carryover.  Baseline:  Goal status: INITIAL  2.  Patient will report 75% improvement in low back pain to improve QOL.  Baseline: 7-8/10 L side Goal status: INITIAL  3.  Patient will demonstrate full pain free lumbar ROM to perform ADLs.   Baseline: see objective Goal status: INITIAL  4.  Patient will be able to swing golf club without increased LBP to return to recreation/exercise.  Baseline: increased pain Goal status: INITIAL  5.  Patient will report at least 6 points improvement on modified Oswestry to demonstrate improved functional ability.  Baseline: 13/50 Goal status: INITIAL   PLAN:  PT FREQUENCY: 1x/week due to work  PT DURATION: 8 weeks  PLANNED INTERVENTIONS: 97110-Therapeutic exercises, 97530- Therapeutic activity, [id number]- Neuromuscular re-education, 97535- Self Care, 96045- Manual therapy, 97014- Electrical stimulation (unattended), 97035- Ultrasound, 40981- Traction (mechanical), Patient/Family education, Balance training,  Stair training, Taping, Dry Needling, Joint mobilization, Joint manipulation, Spinal manipulation, Spinal mobilization, Cryotherapy, and Moist heat.  PLAN FOR NEXT SESSION: review and progress HEP, needs more hip mobility, manual therapy TrDN to L QL, glutes, modalities PRN.   Jena Gauss, PT 05/24/2023, 1:58 PM

## 2023-05-24 ENCOUNTER — Other Ambulatory Visit: Payer: Self-pay

## 2023-05-24 ENCOUNTER — Ambulatory Visit: Payer: BC Managed Care – PPO | Attending: Orthopaedic Surgery | Admitting: Physical Therapy

## 2023-05-24 ENCOUNTER — Encounter: Payer: Self-pay | Admitting: Physical Therapy

## 2023-05-24 DIAGNOSIS — M5459 Other low back pain: Secondary | ICD-10-CM | POA: Diagnosis present

## 2023-05-24 DIAGNOSIS — M5442 Lumbago with sciatica, left side: Secondary | ICD-10-CM | POA: Insufficient documentation

## 2023-05-24 DIAGNOSIS — R252 Cramp and spasm: Secondary | ICD-10-CM | POA: Diagnosis present

## 2023-05-31 ENCOUNTER — Encounter: Payer: Self-pay | Admitting: Physical Therapy

## 2023-05-31 ENCOUNTER — Ambulatory Visit: Payer: BC Managed Care – PPO | Admitting: Physical Therapy

## 2023-05-31 DIAGNOSIS — R252 Cramp and spasm: Secondary | ICD-10-CM

## 2023-05-31 DIAGNOSIS — M5459 Other low back pain: Secondary | ICD-10-CM | POA: Diagnosis not present

## 2023-05-31 NOTE — Therapy (Signed)
OUTPATIENT PHYSICAL THERAPY Treatment   Patient Name: Terry Burton MRN: [id number] DOB:[birth year], 62 y.o., male Today's Date: 05/31/2023  END OF SESSION:  PT End of Session - 05/31/23 1024     Visit Number 2    Authorization Type BCBS    PT Start Time 1022    PT Stop Time 1101    PT Time Calculation (min) 39 min    Activity Tolerance Patient tolerated treatment well    Behavior During Therapy WFL for tasks assessed/performed             Past Medical History:  Diagnosis Date   Arthritis    right hip and back with pain down right leg   Gout    Past Surgical History:  Procedure Laterality Date   FRACTURE SURGERY     repair right elbow years ago- no retained hardware   TOTAL HIP ARTHROPLASTY  05/01/2011   Procedure: TOTAL HIP ARTHROPLASTY ANTERIOR APPROACH;  Surgeon: Kathryne Hitch;  Location: [facility];  Service: Orthopedics;  Laterality: Right;   Patient Active Problem List   Diagnosis Date Noted   Baker's cyst of knee, right 10/01/2020   Acute idiopathic gout of left knee 10/01/2020   Pseudogout of right knee 10/01/2020   Chronic pain of right knee 01/26/2017   Acute pain of right shoulder 12/10/2016   Right hip pain 12/10/2016   Hip arthritis 05/01/2011    PCP: Valinda Hoar, PA-C   REFERRING PROVIDER: Kathryne Hitch, MD   REFERRING DIAG: [phone] (ICD-10-CM) - Acute left-sided low back pain with left-sided sciatica   Rationale for Evaluation and Treatment: Rehabilitation  THERAPY DIAG:  Other low back pain  Cramp and spasm  ONSET DATE: 03/29/2023  SUBJECTIVE:                                                                                                                                                                                           SUBJECTIVE STATEMENT: Pain comes and goes, went to gym the other day and did exercises, a little sore after.    NEXT MD VISIT: 06/14/2023 with [name]  PERTINENT HISTORY:  From MD  notes " He was involved in an accident where a dock that he was fishing off of collapsed recently.  He was eventually seen at [facility] and had x-rays of the lumbar spine and his pelvis and left hip on October 28 of this year.  He still complains of oblique pain to the left side and lower back pain the left side.  He denies any groin pain.  He does have some radicular symptoms going  down his left leg.    He denies any change in bowel or bladder function or weakness in his legs."  PMH: R THA  PAIN:  Are you having pain? Yes: NPRS scale: 6/10 Pain location:  between L side low back to hip Pain description: comes and goes, hurts  Aggravating factors: twitsting, certain movements, especially moving hip, golf Relieving factors: relaxing, tylenol  PRECAUTIONS: None  RED FLAGS: None   WEIGHT BEARING RESTRICTIONS: No  FALLS:  Has patient fallen in last 6 months? 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 The remainder of the disc spaces are maintained. No static listhesis. No spondylolysis.   Prior right total hip arthroplasty in stable alignment. There is no evidence of hip fracture or dislocation. The left femoral head is seated within the acetabulum. Degenerative changes of the left hip with joint space narrowing. The sacroiliac joints and pubic symphysis are anatomically aligned.   PATIENT SURVEYS:  Modified Oswestry 13/50   COGNITION: Overall cognitive status: Within functional limits for tasks assessed     SENSATION: WFL  MUSCLE LENGTH: Hamstrings: Right 90 deg; Left 90 deg mild  tightness bil   POSTURE: No Significant postural limitations  PALPATION: Tenderness L QL, L glute medius, L greater trochanter  LUMBAR ROM:   AROM eval  Flexion To ankles, p! left  Extension 75% limited, stiff  Right lateral flexion WNL  Left lateral flexion WNL, p!  Right rotation WNL, p!  Left rotation WNL   (Blank rows = not tested)  LOWER EXTREMITY ROM:      Tightness bilateral hips, history of R TKA, limited hip IR bilateally  LOWER EXTREMITY MMT:    MMT Right eval Left eval  Hip flexion 5 4p!  Hip extension    Hip abduction 5 5  Hip adduction 5 5  Knee flexion 5 5  Knee extension 5 5  Ankle dorsiflexion 5 5  Ankle plantarflexion     (Blank rows = not tested)  LUMBAR SPECIAL TESTS:  Straight leg raise test: Negative and Long sit test: Positive Negative retest after MET  FUNCTIONAL TESTS:  5 times sit to stand: 10.5 seconds, no UE assist, table height 20" ( to put hips/knees at 90/90)  GAIT: Distance walked: 65' Assistive device utilized: None Level of assistance: Complete Independence Comments: no deviation  TODAY'S TREATMENT:                                                                                                                              DATE:    05/31/23 Therapeutic Exercise: to improve  strength and mobility.  Demo, verbal and tactile cues throughout for technique. Bike L2 x 6 min Supine LTR Supine hip ER/IR Supine L hip flexor stretch HEP update Manual Therapy: to decrease muscle spasm and pain and improve mobility STM/TPR to L QL, L glut med, L UPA mobs lumbar spine, skilled palpation and monitoring during dry needling.,  IASTM L hip flexor (proximal rectus femoris) Trigger Point Dry-Needling  Treatment instructions: Expect mild to moderate muscle soreness. S/S of pneumothorax if dry needled over a lung field, and to seek immediate medical attention should they occur. Patient verbalized understanding of these instructions and  education. Patient Consent Given: Yes Education handout provided: Yes Muscles treated: L Glut medius, L QL Electrical stimulation performed: No Parameters: N/A Treatment response/outcome: Twitch Response Elicited and Palpable Increase in Muscle Length  05/24/23 EVAL Therapeutic Exercise: to improve strength and mobility.  Demo, verbal and tactile cues throughout for technique. MET Isometric L hip extension 5 x 5 sec holds  - negative retest Demo -QL stretches for L side    PATIENT EDUCATION:  Education details:TrDN, HEP update Person educated: Patient Education method: Explanation, Demonstration, Verbal cues, and Handouts Education comprehension: verbalized understanding and returned demonstration  HOME EXERCISE PROGRAM: Access Code: [email or web address] URL: [email or web address] Date: 05/31/2023 Prepared by: Harrie Foreman  Exercises - Seated Quadratus Lumborum Stretch in Chair  - 1 x daily - 7 x weekly - 1 sets - 3 reps - 30 sec hold - Standing Quadratus Lumborum Stretch with Doorway  - 1 x daily - 7 x weekly - 1 sets - 3 reps - 30 sec hold - Supine Lower Trunk Rotation  - 1 x daily - 7 x weekly - 1 sets - 10 reps - Supine Hip Internal and External Rotation  - 1 x daily - 7 x weekly - 1-2 sets - 10 reps - Modified Thomas Stretch  - 1 x daily - 7 x weekly - 1 sets - 3 reps - 30 sec hold    ASSESSMENT:  CLINICAL IMPRESSION: JAYTON RAUTH reports some improvement after IE and good compliance with initial HEP, meeting LTG #1.  He still has pain localized to L QL and L glute medius, so after explanation of DN rational, procedures, outcomes and potential side effects, patient verbalized consent to DN treatment in conjunction with manual STM/DTM and TPR to reduce ttp/muscle tension. Muscles treated as indicated above. DN produced normal response with good twitches elicited resulting in palpable reduction in pain/ttp and muscle tension, with patient noting less pain upon  initiation of movement following DN. Pt educated to expect mild to moderate muscle soreness for up to 24-48 hrs and instructed to continue prescribed home exercise program and current activity level with pt verbalizing understanding of theses instructions.  He also had some pain in proximal L hip flexor, progressed HEP adding gentle hip flexor stretch as well as hip mobility stretches as very tight in hips.  JAIDEV SUDBERRY continues to demonstrate potential for improvement and would benefit from continued skilled therapy to address impairments.       OBJECTIVE IMPAIRMENTS: decreased activity tolerance, decreased ROM, increased fascial restrictions, increased muscle spasms, impaired flexibility, and pain.   ACTIVITY LIMITATIONS: carrying, lifting, and bending  PARTICIPATION LIMITATIONS: driving, community activity, and occupation  PERSONAL FACTORS: Time since onset of injury/illness/exacerbation and 1 comorbidity: history R THA, gout  are also affecting patient's functional outcome.   REHAB POTENTIAL: Excellent  CLINICAL DECISION MAKING: Stable/uncomplicated  EVALUATION COMPLEXITY: Low   GOALS:  Goals reviewed with patient? Yes  SHORT TERM GOALS: Target date: 06/07/2023   Patient will be independent with initial HEP.  Baseline: given Goal status: MET  2.   Patient will report centralization of radicular symptoms.  Baseline: radiates down LLE to above L ankle Goal status: IN PROGRESS   LONG TERM GOALS: Target date: 07/19/2023   Patient will be independent with advanced/ongoing HEP to improve outcomes and carryover.  Baseline:  Goal status: IN PROGRESS  2.  Patient will report 75% improvement in low back pain to improve QOL.  Baseline: 7-8/10 L side Goal status: IN PROGRESS  3.  Patient will demonstrate full pain free lumbar ROM to perform ADLs.   Baseline: see objective Goal status: IN PROGRESS  4.  Patient will be able to swing golf club without increased LBP to return to  recreation/exercise.  Baseline: increased pain Goal status: IN PROGRESS  5.  Patient will report at least 6 points improvement on modified Oswestry to demonstrate improved functional ability.  Baseline: 13/50 Goal status: IN PROGRESS   PLAN:  PT FREQUENCY: 1x/week due to work  PT DURATION: 8 weeks  PLANNED INTERVENTIONS: 97110-Therapeutic exercises, 97530- Therapeutic activity, [id number]- Neuromuscular re-education, 97535- Self Care, 16109- Manual therapy, 97014- Electrical stimulation (unattended), 97035- Ultrasound, 60454- Traction (mechanical), Patient/Family education, Balance training, Stair training, Taping, Dry Needling, Joint mobilization, Joint manipulation, Spinal manipulation, Spinal mobilization, Cryotherapy, and Moist heat.  PLAN FOR NEXT SESSION: assess response to TrDN, review and progress HEP, needs more hip mobility, manual therapy TrDN to L QL, glutes, modalities PRN.   Jena Gauss, PT 05/31/2023, 1:00 PM

## 2023-05-31 NOTE — Patient Instructions (Signed)

## 2023-06-07 ENCOUNTER — Ambulatory Visit: Payer: BC Managed Care – PPO | Admitting: Physical Therapy

## 2023-06-14 ENCOUNTER — Ambulatory Visit (INDEPENDENT_AMBULATORY_CARE_PROVIDER_SITE_OTHER): Payer: BC Managed Care – PPO | Admitting: Sports Medicine

## 2023-06-14 ENCOUNTER — Ambulatory Visit: Payer: BC Managed Care – PPO | Attending: Orthopaedic Surgery | Admitting: Physical Therapy

## 2023-06-14 ENCOUNTER — Encounter: Payer: Self-pay | Admitting: Physical Therapy

## 2023-06-14 ENCOUNTER — Ambulatory Visit (INDEPENDENT_AMBULATORY_CARE_PROVIDER_SITE_OTHER): Payer: BC Managed Care – PPO | Admitting: Orthopaedic Surgery

## 2023-06-14 ENCOUNTER — Encounter: Payer: Self-pay | Admitting: Orthopaedic Surgery

## 2023-06-14 ENCOUNTER — Ambulatory Visit: Payer: Self-pay

## 2023-06-14 DIAGNOSIS — R252 Cramp and spasm: Secondary | ICD-10-CM | POA: Diagnosis present

## 2023-06-14 DIAGNOSIS — M25552 Pain in left hip: Secondary | ICD-10-CM | POA: Diagnosis not present

## 2023-06-14 DIAGNOSIS — M5442 Lumbago with sciatica, left side: Secondary | ICD-10-CM

## 2023-06-14 DIAGNOSIS — M1612 Unilateral primary osteoarthritis, left hip: Secondary | ICD-10-CM

## 2023-06-14 DIAGNOSIS — M5459 Other low back pain: Secondary | ICD-10-CM | POA: Diagnosis present

## 2023-06-14 MED ORDER — LIDOCAINE HCL 1 % IJ SOLN
4.0000 mL | INTRAMUSCULAR | Status: AC | PRN
  Administered 2023-06-14: 4 mL

## 2023-06-14 MED ORDER — METHYLPREDNISOLONE ACETATE 40 MG/ML IJ SUSP
40.0000 mg | INTRAMUSCULAR | Status: AC | PRN
  Administered 2023-06-14: 40 mg via INTRA_ARTICULAR

## 2023-06-14 NOTE — Progress Notes (Signed)
 The patient is continuing to follow-up from injuries he sustained when a dock that he was fishing off of collapsed.  He has been going to physical therapy for left-sided low back and oblique pain and he said the dry needling recently has helped that significantly.  He still has some left hip and groin pain.  Previous x-rays of the left hip showed only just some minimal flattening of the superior lateral femoral head but not a lot of joint space narrowing.  On examination of the left hip today, there are no blocks to range of motion of his left hip.  Move smoothly and fluidly but there is a little bit of pain in the groin.  He does not walk with any significant limp.  His low back exam and oblique exam of the muscles is much improved.  I would like to send him this morning up to my partner Dr. Burnetta for a one-time ultrasound-guided intra-articular steroid injection in his left hip.  I explained to the patient my rationale behind trying this type of injection and the patient is agreeable as well.  His only pain seems to be in that area around his hip.  I would like Dr. Burnetta to take a look at his left hip and consider a steroid injection around the left hip joint versus the psoas tendon area depending on what he thinks the symptoms are as well.  I think it is one of the other in terms of what would benefit the patient the most.  The patient knows that I would like to see him back again in a month to see how he has responded to this injection.

## 2023-06-14 NOTE — Progress Notes (Signed)
   Procedure Note  Patient: Terry Burton             Date of Birth: 23-Feb-1961           MRN: [id number]             Visit Date: 06/14/2023  Procedures: Visit Diagnoses:  1. Unilateral primary osteoarthritis, left hip   2. Pain of left hip    Large Joint Inj: L hip joint on 06/14/2023 8:24 AM Indications: pain and diagnostic evaluation Details: 22 G 3.5 in needle, ultrasound-guided anterior approach Medications: 4 mL lidocaine  1 %; 40 mg methylPREDNISolone  acetate 40 MG/ML Outcome: tolerated well, no immediate complications Procedure, treatment alternatives, risks and benefits explained, specific risks discussed. Consent was given by the patient. Immediately prior to procedure a time out was called to verify the correct patient, procedure, equipment, support staff and site/side marked as required. Patient was prepped and draped in the usual sterile fashion.     - follow-up with Dr. Vernetta as indicated; I am happy to see them as needed  Lonell Sprang, DO Primary Care Sports Medicine Physician  [facility] - Orthopedics  This note was dictated using Dragon naturally speaking software and may contain errors in syntax, spelling, or content which have not been identified prior to signing this note.

## 2023-06-14 NOTE — Therapy (Signed)
 OUTPATIENT PHYSICAL THERAPY Treatment   Patient Name: Terry Burton MRN: [id number] DOB:24-May-1961, 63 y.o., male Today's Date: 06/14/2023  END OF SESSION:  PT End of Session - 06/14/23 1114     Visit Number 3    Authorization Type BCBS    PT Start Time 1112    PT Stop Time 1152    PT Time Calculation (min) 40 min    Activity Tolerance Patient tolerated treatment well    Behavior During Therapy WFL for tasks assessed/performed             Past Medical History:  Diagnosis Date   Arthritis    right hip and back with pain down right leg   Gout    Past Surgical History:  Procedure Laterality Date   FRACTURE SURGERY     repair right elbow years ago- no retained hardware   TOTAL HIP ARTHROPLASTY  05/01/2011   Procedure: TOTAL HIP ARTHROPLASTY ANTERIOR APPROACH;  Surgeon: [name];  Location: [facility];  Service: Orthopedics;  Laterality: Right;   Patient Active Problem List   Diagnosis Date Noted   Baker's cyst of knee, right 10/01/2020   Acute idiopathic gout of left knee 10/01/2020   Pseudogout of right knee 10/01/2020   Chronic pain of right knee 01/26/2017   Acute pain of right shoulder 12/10/2016   Right hip pain 12/10/2016   Hip arthritis 05/01/2011    PCP: Francella Heddy Coe, PA-C   REFERRING PROVIDER: Poli Lonni CINDERELLA, MD   REFERRING DIAG: [phone] (ICD-10-CM) - Acute left-sided low back pain with left-sided sciatica   Rationale for Evaluation and Treatment: Rehabilitation  THERAPY DIAG:  Other low back pain  Cramp and spasm  ONSET DATE: 03/29/2023  SUBJECTIVE:                                                                                                                                                                                           SUBJECTIVE STATEMENT: Dry needling helped, but got steroid shot today just prior to session so a little sore.   Back was good, hip and thigh was hurting.    NEXT MD VISIT: 06/14/2023 with Dr.  Poli  PERTINENT HISTORY:  From MD notes  He was involved in an accident where a dock that he was fishing off of collapsed recently.  He was eventually seen at [facility] and had x-rays of the lumbar spine and his pelvis and left hip on October 28 of this year.  He still complains of oblique pain to the left side and lower back pain the left side.  He denies any groin  pain.  He does have some radicular symptoms going down his left leg.    He denies any change in bowel or bladder function or weakness in his legs.  PMH: R THA  PAIN:  Are you having pain? Yes: NPRS scale: 7/10 Pain location:  R hip Pain description: comes and goes, hurts  Aggravating factors: twitsting, certain movements, especially moving hip, golf Relieving factors: relaxing, tylenol   PRECAUTIONS: None  RED FLAGS: None   WEIGHT BEARING RESTRICTIONS: No  FALLS:  Has patient fallen in last 6 months? 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 The remainder of the disc spaces are maintained. No static listhesis. No spondylolysis.   Prior right total hip arthroplasty in stable alignment. There is no evidence of hip fracture or dislocation. The left femoral head is seated within the acetabulum. Degenerative changes of the left hip with joint space narrowing. The sacroiliac joints and pubic symphysis are anatomically aligned.   PATIENT SURVEYS:  Modified Oswestry 13/50   COGNITION: Overall cognitive status: Within functional limits for tasks assessed     SENSATION: WFL  MUSCLE LENGTH: Hamstrings: Right 90 deg; Left  90 deg mild tightness bil   POSTURE: No Significant postural limitations  PALPATION: Tenderness L QL, L glute medius, L greater trochanter  LUMBAR ROM:   AROM eval  Flexion To ankles, p! left  Extension 75% limited, stiff  Right lateral flexion WNL  Left lateral flexion WNL, p!  Right rotation WNL, p!  Left rotation WNL   (Blank rows = not tested)  LOWER EXTREMITY ROM:      Tightness bilateral hips, history of R TKA, limited hip IR bilateally  LOWER EXTREMITY MMT:    MMT Right eval Left eval  Hip flexion 5 4p!  Hip extension    Hip abduction 5 5  Hip adduction 5 5  Knee flexion 5 5  Knee extension 5 5  Ankle dorsiflexion 5 5  Ankle plantarflexion     (Blank rows = not tested)  LUMBAR SPECIAL TESTS:  Straight leg raise test: Negative and Long sit test: Positive Negative retest after MET  FUNCTIONAL TESTS:  5 times sit to stand: 10.5 seconds, no UE assist, table height 20 ( to put hips/knees at 90/90)  GAIT: Distance walked: 40' Assistive device utilized: None Level of assistance: Complete Independence Comments: no deviation  TODAY'S TREATMENT:                                                                                                                              DATE:   06/13/22 Manual  Therapy: to decrease muscle spasm and pain and improve mobility STM/TPR to lumbar paraspinals, glutes, UPA mobs to L lumbar spine grade 1-2, PA mobs sacrum grade 1-2, mobs to L SIJ, IASTM to bil glutes, lumbar erector spinae, pin and stretch to L piriformis (difficulty due to tightness L hip flexors), IASTM L QL.   Therapeutic Exercise: to improve strength and mobility.   Review and progression of hip flexor stretches, demo today  05/31/23 Therapeutic Exercise: to improve strength and mobility.  Demo, verbal and tactile cues throughout for technique. Bike L2 x 6 min Supine LTR Supine hip ER/IR Supine L hip flexor stretch HEP update Manual Therapy: to decrease muscle  spasm and pain and improve mobility STM/TPR to L QL, L glut med, L UPA mobs lumbar spine, skilled palpation and monitoring during dry needling.,  IASTM L hip flexor (proximal rectus femoris) Trigger Point Dry-Needling  Treatment instructions: Expect mild to moderate muscle soreness. S/S of pneumothorax if dry needled over a lung field, and to seek immediate medical attention should they occur. Patient verbalized understanding of these instructions and education. Patient Consent Given: Yes Education handout provided: Yes Muscles treated: L Glut medius, L QL Electrical stimulation performed: No Parameters: N/A Treatment response/outcome: Twitch Response Elicited and Palpable Increase in Muscle Length  05/24/23 EVAL Therapeutic Exercise: to improve strength and mobility.  Demo, verbal and tactile cues throughout for technique. MET Isometric L hip extension 5 x 5 sec holds  - negative retest Demo -QL stretches for L side    PATIENT EDUCATION:  Education details:TrDN, HEP update Person educated: Patient Education method: Explanation, Demonstration, Verbal cues, and Handouts Education comprehension: verbalized understanding and returned demonstration  HOME EXERCISE PROGRAM: Access Code: [email or web address] URL: [email or web address] Date: 06/14/2023 Prepared by: Almarie Sprinkles  Exercises - Seated Quadratus Lumborum Stretch in Chair  - 1 x daily - 7 x weekly - 1 sets - 3 reps - 30 sec hold - Standing Quadratus Lumborum Stretch with Doorway  - 1 x daily - 7 x weekly - 1 sets - 3 reps - 30 sec hold - Supine Lower Trunk Rotation  - 1 x daily - 7 x weekly - 1 sets - 10 reps - Supine Hip Internal and External Rotation  - 1 x daily - 7 x weekly - 1-2 sets - 10 reps - Modified Thomas Stretch  - 1 x daily - 7 x weekly - 1 sets - 3 reps - 30 sec hold - Standing Hip Flexor Stretch on Chair  - 1 x daily - 7 x weekly - 1 sets - 3 reps - 30 sec hold - Prone Quadriceps Stretch with Strap  - 1 x  daily - 7 x weekly - 1 sets - 3 reps - 30 sec  hold   ASSESSMENT:  CLINICAL IMPRESSION: Terry Burton reported decreased LBP after last session, but was still having significant L hip pain and had to miss last session due to funeral, so just saw orthopedist and had cortisone injection in L hip.  As a result focused on manual therapy to decrease muscle tightness and soreness today, with review of HEP as noted hip flexors still extremely tight limiting ability to bend L knee in prone.   Terry Burton continues to demonstrate potential for improvement and would benefit from continued skilled therapy to address impairments.       OBJECTIVE IMPAIRMENTS: decreased activity tolerance, decreased ROM, increased fascial restrictions, increased muscle spasms, impaired flexibility, and pain.   ACTIVITY LIMITATIONS: carrying,  lifting, and bending  PARTICIPATION LIMITATIONS: driving, community activity, and occupation  PERSONAL FACTORS: Time since onset of injury/illness/exacerbation and 1 comorbidity: history R THA, gout  are also affecting patient's functional outcome.   REHAB POTENTIAL: Excellent  CLINICAL DECISION MAKING: Stable/uncomplicated  EVALUATION COMPLEXITY: Low   GOALS: Goals reviewed with patient? Yes  SHORT TERM GOALS: Target date: 06/07/2023   Patient will be independent with initial HEP.  Baseline: given Goal status: MET  2.   Patient will report centralization of radicular symptoms.  Baseline: radiates down LLE to above L ankle Goal status: IN PROGRESS 06/14/23 pain more localized to L hip today.    LONG TERM GOALS: Target date: 07/19/2023   Patient will be independent with advanced/ongoing HEP to improve outcomes and carryover.  Baseline:  Goal status: IN PROGRESS  2.  Patient will report 75% improvement in low back pain to improve QOL.  Baseline: 7-8/10 L side Goal status: IN PROGRESS  3.  Patient will demonstrate full pain free lumbar ROM to perform ADLs.    Baseline: see objective Goal status: IN PROGRESS  4.  Patient will be able to swing golf club without increased LBP to return to recreation/exercise.  Baseline: increased pain Goal status: IN PROGRESS  5.  Patient will report at least 6 points improvement on modified Oswestry to demonstrate improved functional ability.  Baseline: 13/50 Goal status: IN PROGRESS   PLAN:  PT FREQUENCY: 1x/week due to work  PT DURATION: 8 weeks  PLANNED INTERVENTIONS: 97110-Therapeutic exercises, 97530- Therapeutic activity, [id number]- Neuromuscular re-education, 97535- Self Care, [id number]- Manual therapy, 97014- Electrical stimulation (unattended), 97035- Ultrasound, 02987- Traction (mechanical), Patient/Family education, Balance training, Stair training, Taping, Dry Needling, Joint mobilization, Joint manipulation, Spinal manipulation, Spinal mobilization, Cryotherapy, and Moist heat.  PLAN FOR NEXT SESSION: continue to progress hip mobility, manual therapy TrDN to L QL, glutes, modalities PRN.   Almarie JINNY Sprinkles, PT, DPT 06/14/2023, 12:03 PM

## 2023-06-21 ENCOUNTER — Ambulatory Visit: Payer: BC Managed Care – PPO

## 2023-06-28 ENCOUNTER — Encounter: Payer: Self-pay | Admitting: Physical Therapy

## 2023-06-28 ENCOUNTER — Ambulatory Visit: Payer: BC Managed Care – PPO | Admitting: Physical Therapy

## 2023-06-28 DIAGNOSIS — R252 Cramp and spasm: Secondary | ICD-10-CM

## 2023-06-28 DIAGNOSIS — M5459 Other low back pain: Secondary | ICD-10-CM | POA: Diagnosis not present
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
## 2023-07-05 ENCOUNTER — Encounter: Payer: BC Managed Care – PPO | Admitting: Physical Therapy

## 2023-07-09 ENCOUNTER — Encounter: Payer: Self-pay | Admitting: Physical Therapy

## 2023-07-09 ENCOUNTER — Ambulatory Visit: Payer: BC Managed Care – PPO | Attending: Orthopaedic Surgery | Admitting: Physical Therapy

## 2023-07-09 DIAGNOSIS — R252 Cramp and spasm: Secondary | ICD-10-CM | POA: Diagnosis present

## 2023-07-09 DIAGNOSIS — M5459 Other low back pain: Secondary | ICD-10-CM | POA: Diagnosis present

## 2023-07-09 NOTE — Therapy (Signed)
 OUTPATIENT PHYSICAL THERAPY TREATMENT  Progress Note Reporting Period 05/24/2023 to 07/09/2023   See note below for Objective Data and Assessment of Progress/Goals.     Patient Name: Terry Burton MRN: [id number] DOB:04-Jan-1961, 63 y.o., male Today's Date: 07/09/2023  END OF SESSION:  PT End of Session - 07/09/23 0809     Visit Number 5    Date for PT Re-Evaluation 07/19/23    Authorization Type [employer]    PT Start Time 0809   Pt arrived late   PT Stop Time 0843    PT Time Calculation (min) 34 min    Activity Tolerance Patient tolerated treatment well    Behavior During Therapy WFL for tasks assessed/performed              Past Medical History:  Diagnosis Date   Arthritis    right hip and back with pain down right leg   Gout    Past Surgical History:  Procedure Laterality Date   FRACTURE SURGERY     repair right elbow years ago- no retained hardware   TOTAL HIP ARTHROPLASTY  05/01/2011   Procedure: TOTAL HIP ARTHROPLASTY ANTERIOR APPROACH;  Surgeon: [name];  Location: [facility];  Service: Orthopedics;  Laterality: Right;   Patient Active Problem List   Diagnosis Date Noted   Baker's cyst of knee, right 10/01/2020   Acute idiopathic gout of left knee 10/01/2020   Pseudogout of right knee 10/01/2020   Chronic pain of right knee 01/26/2017   Acute pain of right shoulder 12/10/2016   Right hip pain 12/10/2016   Hip arthritis 05/01/2011    PCP: Francella Heddy Coe, PA-C   REFERRING PROVIDER: Poli Lonni CINDERELLA, MD   REFERRING DIAG: [phone] (ICD-10-CM) - Acute left-sided low back pain with left-sided sciatica   Rationale for Evaluation and Treatment: Rehabilitation  THERAPY DIAG:  Other low back pain  Cramp and spasm  ONSET DATE: 03/29/2023  NEXT MD VISIT: 07/12/2023 with Bertrum Gaskins, PA-C   SUBJECTIVE:                                                                                                                                                                                            SUBJECTIVE STATEMENT: Pt report his L thigh feels tight today - some good days, some bad.  PERTINENT HISTORY:  From MD notes  He was involved in an accident where a dock that he was fishing off of collapsed recently.  He was eventually seen at [facility] and had x-rays of the lumbar spine and his pelvis and left hip on October 28 of this year.  He still complains of oblique pain to the left side and lower back pain the left side.  He denies any groin pain.  He does have some radicular symptoms going down his left leg.    He denies any change in bowel or bladder function or weakness in his legs.  PMH: R THA  PAIN:  Are you having pain? Yes: NPRS scale: 5-6/10 Pain location:  L hip/thigh Pain description: comes and goes, stabbing  Aggravating factors: twitsting, certain movements, especially moving hip, golf Relieving factors: relaxing, tylenol   PRECAUTIONS: None  RED FLAGS: None   WEIGHT BEARING RESTRICTIONS: No  FALLS:  Has patient fallen in last 6 months? 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 The remainder of the disc spaces are maintained. No static listhesis. No spondylolysis.   Prior right total hip arthroplasty in stable alignment. There is no evidence of hip fracture or dislocation. The left femoral head is seated within the acetabulum. Degenerative changes of the left hip with joint space narrowing. The sacroiliac joints and pubic symphysis are anatomically aligned.   PATIENT SURVEYS:  Modified Oswestry 13/50    COGNITION: Overall cognitive status: Within functional limits for tasks assessed     SENSATION: WFL  MUSCLE LENGTH: Hamstrings: Right 90 deg; Left 90 deg mild tightness bil   POSTURE: No Significant postural limitations  PALPATION: Tenderness L QL, L glute medius, L greater trochanter  LUMBAR ROM:   AROM eval  Flexion To ankles, p! left  Extension 75% limited, stiff  Right lateral flexion WNL  Left lateral flexion WNL, p!  Right rotation WNL, p!  Left rotation WNL   (Blank rows = not tested)  LOWER EXTREMITY ROM:      Tightness bilateral hips, history of R TKA, limited hip IR bilateally  LOWER EXTREMITY MMT:    MMT Right eval Left eval  Hip flexion 5 4p!  Hip extension    Hip abduction 5 5  Hip adduction 5 5  Knee flexion 5 5  Knee extension 5 5  Ankle dorsiflexion 5 5  Ankle plantarflexion     (Blank rows = not tested)  LUMBAR SPECIAL TESTS:  Straight leg raise test: Negative and Long sit test: Positive Negative retest after MET  FUNCTIONAL TESTS:  5 times sit to stand: 10.5 seconds, no UE assist, table height 20 ( to put hips/knees at 90/90)  GAIT: Distance walked: 83' Assistive device utilized: None Level of assistance: Complete Independence Comments: no deviation  TODAY'S TREATMENT:  DATE:   07/09/23 THERAPEUTIC EXERCISE: To improve strength, endurance, ROM, and flexibility.  Demonstration, verbal and tactile cues throughout for technique.  Rec Bike - L3 x 6 min S/L L QL stretch with light manual overpressure by PT x 60 Mod thomas L hip flexor stretch with light manual overpressure by PT 2 x 60 Standing side bending L ITB/TFL stretch 3 x 30 Standing L QL stretch at door frame 2 x 30  MANUAL THERAPY: To promote normalized muscle tension, improved flexibility, improved joint mobility, increased ROM, and reduced  pain. Trigger Point Dry Needling: Treatment instructions/education: Subsequent Treatment: Instructions provided previously at initial dry needling treatment.  Education Handout Provided: Previously Provided Consent: Patient Verbal Consent Given: Yes Treatment: Muscles Treated: L iliacus, L TFL and L QL Skilled palpation and monitoring of soft tissue during DN Electrical Stimulation Performed: No Treatment Response/Outcome: Twitch Response Elicited, Palpable Increase in Muscle Length, Decreased TTP, and Improved Exercise Tolerance STM/DTM, manual TPR and pin & stretch to muscles addressed with DN   06/28/23 Therapeutic Exercise: to improve strength and mobility.  Demo, verbal and tactile cues throughout for technique. Nustep L5 x 6 min  Hip IR/ER  Standing hip openers x 10 Chops GTB x 10 Reverse chops GTB x 10  Manual Therapy: to decrease muscle spasm and pain and improve mobility L hip- femoral/acetabular distraction IASTM to L hip flexor   06/14/23 Manual Therapy: to decrease muscle spasm and pain and improve mobility STM/TPR to lumbar paraspinals, glutes, UPA mobs to L lumbar spine grade 1-2, PA mobs sacrum grade 1-2, mobs to L SIJ, IASTM to bil glutes, lumbar erector spinae, pin and stretch to L piriformis (difficulty due to tightness L hip flexors), IASTM L QL.   Therapeutic Exercise: to improve strength and mobility.   Review and progression of hip flexor stretches, demo today   05/31/23 Therapeutic Exercise: to improve strength and mobility.  Demo, verbal and tactile cues throughout for technique. Bike L2 x 6 min Supine LTR Supine hip ER/IR Supine L hip flexor stretch HEP update Manual Therapy: to decrease muscle spasm and pain and improve mobility STM/TPR to L QL, L glut med, L UPA mobs lumbar spine, skilled palpation and monitoring during dry needling.,  IASTM L hip flexor (proximal rectus femoris) Trigger Point Dry-Needling  Treatment instructions: Expect mild to  moderate muscle soreness. S/S of pneumothorax if dry needled over a lung field, and to seek immediate medical attention should they occur. Patient verbalized understanding of these instructions and education. Patient Consent Given: Yes Education handout provided: Yes Muscles treated: L Glut medius, L QL Electrical stimulation performed: No Parameters: N/A Treatment response/outcome: Twitch Response Elicited and Palpable Increase in Muscle Length   05/24/23 EVAL Therapeutic Exercise: to improve strength and mobility.  Demo, verbal and tactile cues throughout for technique. MET Isometric L hip extension 5 x 5 sec holds  - negative retest Demo -QL stretches for L side    PATIENT EDUCATION:  Education details:HEP update Person educated: Patient Education method: Explanation, Demonstration, Verbal cues, and Handouts Education comprehension: verbalized understanding and returned demonstration  HOME EXERCISE PROGRAM: Access Code: [email or web address] URL: [email or web address] Date: 07/09/2023 Prepared by: Elijah Hidden  Exercises - Standing Quadratus Lumborum Stretch with Doorway  - 1 x daily - 7 x weekly - 1 sets - 3 reps - 30 sec hold - Supine Lower Trunk Rotation  - 1 x daily - 7 x weekly - 1 sets - 10 reps - Supine Hip Internal and External Rotation  -  1 x daily - 7 x weekly - 1-2 sets - 10 reps - Modified Thomas Stretch  - 1 x daily - 7 x weekly - 1 sets - 3 reps - 30 sec hold - Standing Hip Flexor Stretch on Chair  - 1 x daily - 7 x weekly - 1 sets - 3 reps - 30 sec hold - Prone Quadriceps Stretch with Strap  - 1 x daily - 7 x weekly - 1 sets - 3 reps - 30 sec  hold - Standing Diagonal Chop  - 1 x daily - 7 x weekly - 3 sets - 10 reps - Reverse Chop with Resistance  - 1 x daily - 7 x weekly - 3 sets - 10 reps - Standing ITB Stretch  - 1 x daily - 7 x weekly - 3 reps - 30 sec hold    ASSESSMENT:  CLINICAL IMPRESSION: Devlyn reports 65-70% improvement in overall low back  pain since start of PT with more recent issues being related to intermittent L hip and proximal thigh tightness and stabbing pain.  Increased muscle tension and TTP identified in L hip flexors, TLF and QL which appeared amenable to TPDN and patient reported positive response to prior DN for back.  MT focusing on these areas incorporating TPDN with good twitch responses elicited resulting in palpable reduction in muscle tension and feeling of increased flexibility/decreased tightness.  Relevant stretches reviewed to address areas treated today with ITB/TFL stretch added to HEP.  Conal is demonstrating good progress with PT and will benefit from continued skilled PT to ongoing above deficits to improve mobility and activity tolerance with decreased pain interference.  He is nearing the end of his current POC and may benefit from recert pending outcome/plan following MD f/u visit on 07/12/23.  OBJECTIVE IMPAIRMENTS: decreased activity tolerance, decreased ROM, increased fascial restrictions, increased muscle spasms, impaired flexibility, and pain.   ACTIVITY LIMITATIONS: carrying, lifting, and bending  PARTICIPATION LIMITATIONS: driving, community activity, and occupation  PERSONAL FACTORS: Time since onset of injury/illness/exacerbation and 1 comorbidity: history R THA, gout  are also affecting patient's functional outcome.   REHAB POTENTIAL: Excellent  CLINICAL DECISION MAKING: Stable/uncomplicated  EVALUATION COMPLEXITY: Low   GOALS: Goals reviewed with patient? Yes  SHORT TERM GOALS: Target date: 06/07/2023   Patient will be independent with initial HEP.  Baseline: given Goal status: MET  2.   Patient will report centralization of radicular symptoms.  Baseline: radiates down LLE to above L ankle Goal status: IN PROGRESS -  07/09/23 still having intermittent pain more localized to L hip and proximal thigh     LONG TERM GOALS: Target date: 07/19/2023   Patient will be independent with  advanced/ongoing HEP to improve outcomes and carryover.  Baseline:  Goal status: IN PROGRESS - 07/09/23 - Pt reports no concerns with current HEP; updated today  2.  Patient will report 75% improvement in low back pain to improve QOL.  Baseline: 7-8/10 L side Goal status: IN PROGRESS - 07/09/23 - Pt reports 65-70% improvement  3.  Patient will demonstrate full pain free lumbar ROM to perform ADLs.   Baseline: see objective Goal status: IN PROGRESS  4.  Patient will be able to swing golf club without increased LBP to return to recreation/exercise.  Baseline: increased pain Goal status: IN PROGRESS  5.  Patient will report at least 6 points improvement on modified Oswestry to demonstrate improved functional ability.  Baseline: 13/50 Goal status: IN PROGRESS   PLAN:  PT FREQUENCY: 1x/week due to work  PT DURATION: 8 weeks  PLANNED INTERVENTIONS: 97110-Therapeutic exercises, 97530- Therapeutic activity, [id number]- Neuromuscular re-education, 97535- Self Care, [id number]- Manual therapy, [id number]- Electrical stimulation (unattended), 97035- Ultrasound, 02987- Traction (mechanical), Patient/Family education, Balance training, Stair training, Taping, Dry Needling, Joint mobilization, Joint manipulation, Spinal manipulation, Spinal mobilization, Cryotherapy, and Moist heat.  PLAN FOR NEXT SESSION: Recert vs transition to HEP; continue to progress hip mobility, manual therapy TrDN to L QL, glutes, modalities PRN.   Elijah CHRISTELLA Hidden, PT 07/09/2023, 9:00 AM

## 2023-07-12 ENCOUNTER — Encounter: Payer: Self-pay | Admitting: Physician Assistant

## 2023-07-12 ENCOUNTER — Encounter: Payer: BC Managed Care – PPO | Admitting: Physical Therapy

## 2023-07-12 ENCOUNTER — Ambulatory Visit (INDEPENDENT_AMBULATORY_CARE_PROVIDER_SITE_OTHER): Payer: BC Managed Care – PPO | Admitting: Physician Assistant

## 2023-07-12 DIAGNOSIS — M1612 Unilateral primary osteoarthritis, left hip: Secondary | ICD-10-CM

## 2023-07-12 NOTE — Addendum Note (Signed)
 Addended by: Levie Owensby on: 07/12/2023 08:33 AM   Modules accepted: Orders

## 2023-07-12 NOTE — Progress Notes (Signed)
 HPI: Mr. Cordes returns today 1 month follow-up left hip pain.  He underwent intra-articular injection by Dr. Vaughn Georges on 06/14/2023.  He states for approximately 10 days he had virtually no pain in the hip.  Initially after the accident he had 10 out of 10 pain in the groin.  Now currently his pain is 4-5 out of 10 pain at worst.  He denies any numbness tingling down the leg.  No prior pain in the left hip groin.  He is taking no medications.  He is going to physical therapy doing dry needling which helps some.  Mostly his pain is when at work walking.  Review of systems: See HPI otherwise negative   Physical exam: General: Well-developed well-nourished male no acute distress.  Affect appropriate.  Ambulates without any assistive device nonantalgic gait. Bilateral hips: Right hip full range of motion without pain.  Left hip slight limitation of internal/external rotation with some discomfort.  Impression: Left hip pain  Plan given patient's virtually no pain after the injection for 10 days and the fact that his pain is now returned.  Along with radiographs which showed slight cam impingement and slight flattening of the femoral head recommend MRI to evaluate cartilage.  Having follow-up after the MRI to go over results discuss further treatment.  Questions encouraged and answered at length.

## 2023-07-16 ENCOUNTER — Ambulatory Visit: Payer: BC Managed Care – PPO | Admitting: Physical Therapy

## 2023-07-16 ENCOUNTER — Encounter: Payer: Self-pay | Admitting: Physical Therapy

## 2023-07-16 DIAGNOSIS — M5459 Other low back pain: Secondary | ICD-10-CM

## 2023-07-16 DIAGNOSIS — R252 Cramp and spasm: Secondary | ICD-10-CM

## 2023-07-16 NOTE — Therapy (Signed)
OUTPATIENT PHYSICAL THERAPY TREATMENT/RECERT    Patient Name: Terry Burton MRN: [id number] DOB:[birth year], 63 y.o., male Today's Date: 07/16/2023  END OF SESSION:  PT End of Session - 07/16/23 0936     Visit Number 6    Date for PT Re-Evaluation 07/19/23    Authorization Type [employer]    PT Start Time 0933    PT Stop Time 1013    PT Time Calculation (min) 40 min    Activity Tolerance Patient tolerated treatment well    Behavior During Therapy WFL for tasks assessed/performed              Past Medical History:  Diagnosis Date   Arthritis    right hip and back with pain down right leg   Gout    Past Surgical History:  Procedure Laterality Date   FRACTURE SURGERY     repair right elbow years ago- no retained hardware   TOTAL HIP ARTHROPLASTY  05/01/2011   Procedure: TOTAL HIP ARTHROPLASTY ANTERIOR APPROACH;  Surgeon: Kathryne Hitch;  Location: [facility];  Service: Orthopedics;  Laterality: Right;   Patient Active Problem List   Diagnosis Date Noted   Baker's cyst of knee, right 10/01/2020   Acute idiopathic gout of left knee 10/01/2020   Pseudogout of right knee 10/01/2020   Chronic pain of right knee 01/26/2017   Acute pain of right shoulder 12/10/2016   Right hip pain 12/10/2016   Hip arthritis 05/01/2011    PCP: Valinda Hoar, PA-C   REFERRING PROVIDER: Kathryne Hitch, MD   REFERRING DIAG: [phone] (ICD-10-CM) - Acute left-sided low back pain with left-sided sciatica   Rationale for Evaluation and Treatment: Rehabilitation  THERAPY DIAG:  Other low back pain  Cramp and spasm  ONSET DATE: 03/29/2023  NEXT MD VISIT: after MRI on 07/25/23   SUBJECTIVE:                                                                                                                                                                                           SUBJECTIVE STATEMENT: Still having pain over front of L hip, especially when twists or turns, thinks  its from the cortisone shot still.  Has MRI scheduled on 07/25/23.    PERTINENT HISTORY:  From MD notes " He was involved in an accident where a dock that he was fishing off of collapsed recently.  He was eventually seen at [facility] and had x-rays of the lumbar spine and his pelvis and left hip on October 28 of this year.  He still complains of oblique pain to the left side and  lower back pain the left side.  He denies any groin pain.  He does have some radicular symptoms going down his left leg.    He denies any change in bowel or bladder function or weakness in his legs."  PMH: R THA  PAIN:  Are you having pain? Yes: NPRS scale: 5-6/10 Pain location:  L hip/thigh Pain description: comes and goes, stabbing  Aggravating factors: twitsting, certain movements, especially moving hip, golf Relieving factors: relaxing, tylenol  PRECAUTIONS: None  RED FLAGS: None   WEIGHT BEARING RESTRICTIONS: No  FALLS:  Has patient fallen in last 6 months? 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 The remainder of the disc spaces are maintained. No static listhesis. No spondylolysis.   Prior right total hip arthroplasty in stable alignment. There is no evidence of hip fracture or dislocation. The left femoral head is seated within the acetabulum. Degenerative changes of the left hip with joint space narrowing. The sacroiliac joints and pubic symphysis are anatomically aligned.   PATIENT SURVEYS:  Modified Oswestry 13/50   COGNITION: Overall cognitive status: Within functional limits for tasks  assessed     SENSATION: WFL  MUSCLE LENGTH: Hamstrings: Right 90 deg; Left 90 deg mild tightness bil   POSTURE: No Significant postural limitations  PALPATION: Tenderness L QL, L glute medius, L greater trochanter  LUMBAR ROM:   AROM eval  Flexion To ankles, p! left  Extension 75% limited, stiff  Right lateral flexion WNL  Left lateral flexion WNL, p!  Right rotation WNL, p!  Left rotation WNL   (Blank rows = not tested)  LOWER EXTREMITY ROM:      Tightness bilateral hips, history of R TKA, limited hip IR bilateally  LOWER EXTREMITY MMT:    MMT Right eval Left eval  Hip flexion 5 4p!  Hip extension    Hip abduction 5 5  Hip adduction 5 5  Knee flexion 5 5  Knee extension 5 5  Ankle dorsiflexion 5 5  Ankle plantarflexion     (Blank rows = not tested)  LUMBAR SPECIAL TESTS:  Straight leg raise test: Negative and Long sit test: Positive Negative retest after MET  FUNCTIONAL TESTS:  5 times sit to stand: 10.5 seconds, no UE assist, table height 20" ( to put hips/knees at 90/90)  GAIT: Distance walked: 52' Assistive device utilized: None Level of assistance: Complete Independence Comments: no deviation  TODAY'S TREATMENT:  DATE:   07/16/23 Therapeutic Exercise: to improve strength and mobility.  Demo, verbal and tactile cues throughout for technique. Bike L2 x 6 min  Fire Hydrants 2 x 8 r/l Leg extensions quadruped 2 x 5 r/l  Quad leg extensions 2 x 5 r/l Retro step on L x 10  Standing clock exercise 2 x 5 r/l Bridges 2 x 10 - pulling over hip but tolerable Manual Therapy: to decrease muscle spasm and pain and improve mobility Distraction of femoral acetabular joint with belt  07/09/23 THERAPEUTIC EXERCISE: To improve strength, endurance, ROM, and flexibility.  Demonstration, verbal and tactile cues throughout for technique.  Rec  Bike - L3 x 6 min S/L L QL stretch with light manual overpressure by PT x 60" Mod thomas L hip flexor stretch with light manual overpressure by PT 2 x 60" Standing side bending L ITB/TFL stretch 3 x 30" Standing L QL stretch at door frame 2 x 30"  MANUAL THERAPY: To promote normalized muscle tension, improved flexibility, improved joint mobility, increased ROM, and reduced pain. Trigger Point Dry Needling: Treatment instructions/education: Subsequent Treatment: Instructions provided previously at initial dry needling treatment.  Education Handout Provided: Previously Provided Consent: Patient Verbal Consent Given: Yes Treatment: Muscles Treated: L iliacus, L TFL and L QL Skilled palpation and monitoring of soft tissue during DN Electrical Stimulation Performed: No Treatment Response/Outcome: Twitch Response Elicited, Palpable Increase in Muscle Length, Decreased TTP, and Improved Exercise Tolerance STM/DTM, manual TPR and pin & stretch to muscles addressed with DN   06/28/23 Therapeutic Exercise: to improve strength and mobility.  Demo, verbal and tactile cues throughout for technique. Nustep L5 x 6 min  Hip IR/ER  Standing hip openers x 10 Chops GTB x 10 Reverse chops GTB x 10  Manual Therapy: to decrease muscle spasm and pain and improve mobility L hip- femoral/acetabular distraction IASTM to L hip flexor   06/14/23 Manual Therapy: to decrease muscle spasm and pain and improve mobility STM/TPR to lumbar paraspinals, glutes, UPA mobs to L lumbar spine grade 1-2, PA mobs sacrum grade 1-2, mobs to L SIJ, IASTM to bil glutes, lumbar erector spinae, pin and stretch to L piriformis (difficulty due to tightness L hip flexors), IASTM L QL.   Therapeutic Exercise: to improve strength and mobility.   Review and progression of hip flexor stretches, demo today   PATIENT EDUCATION:  Education details:HEP update Person educated: Patient Education method: Explanation, Demonstration, Verbal  cues, and Handouts Education comprehension: verbalized understanding and returned demonstration  HOME EXERCISE PROGRAM: Access Code: [email or web address] URL: [email or web address] Date: 07/16/2023 Prepared by: Harrie Foreman  Exercises - Standing Quadratus Lumborum Stretch with Doorway  - 1 x daily - 7 x weekly - 1 sets - 3 reps - 30 sec hold - Supine Lower Trunk Rotation  - 1 x daily - 7 x weekly - 1 sets - 10 reps - Supine Hip Internal and External Rotation  - 1 x daily - 7 x weekly - 1-2 sets - 10 reps - Modified Thomas Stretch  - 1 x daily - 7 x weekly - 1 sets - 3 reps - 30 sec hold - Standing Hip Flexor Stretch on Chair  - 1 x daily - 7 x weekly - 1 sets - 3 reps - 30 sec hold - Prone Quadriceps Stretch with Strap  - 1 x daily - 7 x weekly - 1 sets - 3 reps - 30 sec  hold - Standing Diagonal Chop  - 1 x daily -  7 x weekly - 3 sets - 10 reps - Reverse Chop with Resistance  - 1 x daily - 7 x weekly - 3 sets - 10 reps - Standing ITB Stretch  - 1 x daily - 7 x weekly - 3 reps - 30 sec hold - Quadruped Hip Abduction and External Rotation  - 1 x daily - 7 x weekly - 2 sets - 10 reps - Beginner Front Arm Support  - 1 x daily - 7 x weekly - 2 sets - 10 reps - Supine Bridge  - 1 x daily - 7 x weekly - 2 sets - 10 reps - Single Leg Balance with Clock Reach  - 1 x daily - 7 x weekly - 2 sets - 10 reps - Retro Step  - 1 x daily - 7 x weekly - 2 sets - 10 reps  ASSESSMENT:  CLINICAL IMPRESSION: Ripken continues to reports pain over anterior L hip.  TrDN helped last week for a few days but then pain returned again.  He returned to orthopedist and MRI of his L hip has been ordered to assess cartilage due to hip impingement symptoms.   Today focused on hip strengthening exercises as previous session have focused primarily on stretching, and he reports feeling weak.  Noted today he did fatigue rapidly with exercises, tolerated exercises though without pain, just pulling into hip.  Recommend  continued skilled therapy for additional 1x/week for 4 more weeks to continue strengthening and mobility while working on manual therapy to decompress hip joint to improve pain.   OBJECTIVE IMPAIRMENTS: decreased activity tolerance, decreased ROM, increased fascial restrictions, increased muscle spasms, impaired flexibility, and pain.   ACTIVITY LIMITATIONS: carrying, lifting, and bending  PARTICIPATION LIMITATIONS: driving, community activity, and occupation  PERSONAL FACTORS: Time since onset of injury/illness/exacerbation and 1 comorbidity: history R THA, gout  are also affecting patient's functional outcome.   REHAB POTENTIAL: Excellent  CLINICAL DECISION MAKING: Stable/uncomplicated  EVALUATION COMPLEXITY: Low   GOALS: Goals reviewed with patient? Yes  SHORT TERM GOALS: Target date: 06/07/2023   Patient will be independent with initial HEP.  Baseline: given Goal status: MET  2.   Patient will report centralization of radicular symptoms.  Baseline: radiates down LLE to above L ankle Goal status: IN PROGRESS -  07/09/23 still having intermittent pain more localized to L hip and proximal thigh     LONG TERM GOALS: Target date: 07/19/2023   Patient will be independent with advanced/ongoing HEP to improve outcomes and carryover.  Baseline:  Goal status: IN PROGRESS - 07/09/23 - Pt reports no concerns with current HEP; updated today  2.  Patient will report 75% improvement in low back pain to improve QOL.  Baseline: 7-8/10 L side Goal status: IN PROGRESS - 07/09/23 - Pt reports 65-70% improvement  3.  Patient will demonstrate full pain free lumbar ROM to perform ADLs.   Baseline: see objective Goal status: IN PROGRESS  4.  Patient will be able to swing golf club without increased LBP to return to recreation/exercise.  Baseline: increased pain Goal status: IN PROGRESS  5.  Patient will report at least 6 points improvement on modified Oswestry to demonstrate improved functional  ability.  Baseline: 13/50 Goal status: IN PROGRESS   PLAN:  PT FREQUENCY: 1x/week due to work  PT DURATION: 8 weeks  PLANNED INTERVENTIONS: 97110-Therapeutic exercises, 97530- Therapeutic activity, [id number]- Neuromuscular re-education, 97535- Self Care, [id number]- Manual therapy, [id number]- Electrical stimulation (unattended), [id number]- Ultrasound, [id number]- Traction (mechanical), Patient/Family  education, Balance training, Stair training, Taping, Dry Needling, Joint mobilization, Joint manipulation, Spinal manipulation, Spinal mobilization, Cryotherapy, and Moist heat.  PLAN FOR NEXT SESSION: Recert vs transition to HEP; continue to progress hip mobility, manual therapy TrDN to L QL, glutes, modalities PRN.   Jena Gauss, PT 07/16/2023, 1:44 PM

## 2023-07-19 ENCOUNTER — Encounter: Payer: Self-pay | Admitting: Physician Assistant

## 2023-07-23 ENCOUNTER — Ambulatory Visit: Payer: BC Managed Care – PPO | Admitting: Physical Therapy

## 2023-07-23 ENCOUNTER — Encounter: Payer: Self-pay | Admitting: Physical Therapy

## 2023-07-23 DIAGNOSIS — M5459 Other low back pain: Secondary | ICD-10-CM | POA: Diagnosis not present

## 2023-07-23 DIAGNOSIS — R252 Cramp and spasm: Secondary | ICD-10-CM

## 2023-07-23 NOTE — Therapy (Signed)
OUTPATIENT PHYSICAL THERAPY TREATMENT    Patient Name: Terry Burton MRN: [id number] DOB:1960/08/05, 63 y.o., male Today's Date: 07/23/2023  END OF SESSION:  PT End of Session - 07/23/23 0937     Visit Number 7    Date for PT Re-Evaluation 07/19/23    Authorization Type [employer]    PT Start Time [phone]    PT Stop Time 1018    PT Time Calculation (min) 42 min    Activity Tolerance Patient tolerated treatment well    Behavior During Therapy WFL for tasks assessed/performed              Past Medical History:  Diagnosis Date   Arthritis    right hip and back with pain down right leg   Gout    Past Surgical History:  Procedure Laterality Date   FRACTURE SURGERY     repair right elbow years ago- no retained hardware   TOTAL HIP ARTHROPLASTY  05/01/2011   Procedure: TOTAL HIP ARTHROPLASTY ANTERIOR APPROACH;  Surgeon: Kathryne Hitch;  Location: [facility];  Service: Orthopedics;  Laterality: Right;   Patient Active Problem List   Diagnosis Date Noted   Baker's cyst of knee, right 10/01/2020   Acute idiopathic gout of left knee 10/01/2020   Pseudogout of right knee 10/01/2020   Chronic pain of right knee 01/26/2017   Acute pain of right shoulder 12/10/2016   Right hip pain 12/10/2016   Hip arthritis 05/01/2011    PCP: Valinda Hoar, PA-C   REFERRING PROVIDER: Kathryne Hitch, MD   REFERRING DIAG: [phone] (ICD-10-CM) - Acute left-sided low back pain with left-sided sciatica   Rationale for Evaluation and Treatment: Rehabilitation  THERAPY DIAG:  Other low back pain  Cramp and spasm  ONSET DATE: 03/29/2023  NEXT MD VISIT: after MRI on 07/25/23   SUBJECTIVE:                                                                                                                                                                                           SUBJECTIVE STATEMENT: Still having pain off and on.  Hurt yesterday with weather.  Noticed when backs up hits  the muscle a sharp pain.  Hip is just sore today. Still feels that shot make the hip weaker.  Has MRI scheduled on 07/25/23.    PERTINENT HISTORY:  From MD notes " He was involved in an accident where a dock that he was fishing off of collapsed recently.  He was eventually seen at [facility] and had x-rays of the lumbar spine and his pelvis and left hip on October  28 of this year.  He still complains of oblique pain to the left side and lower back pain the left side.  He denies any groin pain.  He does have some radicular symptoms going down his left leg.    He denies any change in bowel or bladder function or weakness in his legs."  PMH: R THA  PAIN:  Are you having pain? Yes: NPRS scale: 0/10 Pain location:  L hip/thigh Pain description: comes and goes, stabbing  Aggravating factors: twitsting, certain movements, especially moving hip, golf Relieving factors: relaxing, tylenol  PRECAUTIONS: None  RED FLAGS: None   WEIGHT BEARING RESTRICTIONS: No  FALLS:  Has patient fallen in last 6 months? 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 The remainder of the disc spaces are maintained. No static listhesis. No spondylolysis.   Prior right total hip arthroplasty in stable alignment. There is no evidence of hip fracture or dislocation. The left femoral head is seated within the acetabulum. Degenerative changes of the left hip with joint space narrowing. The sacroiliac joints and pubic symphysis are anatomically aligned.   PATIENT SURVEYS:  Modified Oswestry 13/50   COGNITION: Overall  cognitive status: Within functional limits for tasks assessed     SENSATION: WFL  MUSCLE LENGTH: Hamstrings: Right 90 deg; Left 90 deg mild tightness bil   POSTURE: No Significant postural limitations  PALPATION: Tenderness L QL, L glute medius, L greater trochanter  LUMBAR ROM:   AROM eval  Flexion To ankles, p! left  Extension 75% limited, stiff  Right lateral flexion WNL  Left lateral flexion WNL, p!  Right rotation WNL, p!  Left rotation WNL   (Blank rows = not tested)  LOWER EXTREMITY ROM:      Tightness bilateral hips, history of R TKA, limited hip IR bilateally  LOWER EXTREMITY MMT:    MMT Right eval Left eval  Hip flexion 5 4p!  Hip extension    Hip abduction 5 5  Hip adduction 5 5  Knee flexion 5 5  Knee extension 5 5  Ankle dorsiflexion 5 5  Ankle plantarflexion     (Blank rows = not tested)  LUMBAR SPECIAL TESTS:  Straight leg raise test: Negative and Long sit test: Positive Negative retest after MET  FUNCTIONAL TESTS:  5 times sit to stand: 10.5 seconds, no UE assist, table height 20" ( to put hips/knees at 90/90)  GAIT: Distance walked: 64' Assistive device utilized: None Level of assistance: Complete Independence Comments: no deviation  TODAY'S TREATMENT:  DATE:   07/23/23 Therapeutic Exercise: to improve strength and mobility.  Demo, verbal and tactile cues throughout for technique. Bike L2 x 6 min  Bridges x 10 Bridge with BTB x 20 Clamshell S/L BTB x 15 SLR 2 x 10 r/l Prone hip extension 2 x 10 r/l Standing single leg RDL 2 x 10 R/L - table in front and SBA for safety Standing hip flexor stretch Manual Therapy: to decrease muscle spasm and pain and improve mobility IASTM with s/s edge tool to L hip flexor     07/16/23 Therapeutic Exercise: to improve strength and mobility.  Demo, verbal and tactile cues  throughout for technique. Bike L2 x 6 min  Fire Hydrants 2 x 8 r/l Leg extensions quadruped 2 x 5 r/l  Quad leg extensions 2 x 5 r/l Retro step on L x 10  Standing clock exercise 2 x 5 r/l Bridges 2 x 10 - pulling over hip but tolerable Manual Therapy: to decrease muscle spasm and pain and improve mobility Distraction of femoral acetabular joint with belt  07/09/23 THERAPEUTIC EXERCISE: To improve strength, endurance, ROM, and flexibility.  Demonstration, verbal and tactile cues throughout for technique.  Rec Bike - L3 x 6 min S/L L QL stretch with light manual overpressure by PT x 60" Mod thomas L hip flexor stretch with light manual overpressure by PT 2 x 60" Standing side bending L ITB/TFL stretch 3 x 30" Standing L QL stretch at door frame 2 x 30"  MANUAL THERAPY: To promote normalized muscle tension, improved flexibility, improved joint mobility, increased ROM, and reduced pain. Trigger Point Dry Needling: Treatment instructions/education: Subsequent Treatment: Instructions provided previously at initial dry needling treatment.  Education Handout Provided: Previously Provided Consent: Patient Verbal Consent Given: Yes Treatment: Muscles Treated: L iliacus, L TFL and L QL Skilled palpation and monitoring of soft tissue during DN Electrical Stimulation Performed: No Treatment Response/Outcome: Twitch Response Elicited, Palpable Increase in Muscle Length, Decreased TTP, and Improved Exercise Tolerance STM/DTM, manual TPR and pin & stretch to muscles addressed with DN   06/28/23 Therapeutic Exercise: to improve strength and mobility.  Demo, verbal and tactile cues throughout for technique. Nustep L5 x 6 min  Hip IR/ER  Standing hip openers x 10 Chops GTB x 10 Reverse chops GTB x 10  Manual Therapy: to decrease muscle spasm and pain and improve mobility L hip- femoral/acetabular distraction IASTM to L hip flexor   PATIENT EDUCATION:  Education details:HEP update Person  educated: Patient Education method: Programmer, multimedia, Demonstration, Verbal cues, and Handouts Education comprehension: verbalized understanding and returned demonstration  HOME EXERCISE PROGRAM: Access Code: [email or web address] URL: [email or web address] Date: 07/23/2023 Prepared by: Harrie Foreman  Exercises - Standing Quadratus Lumborum Stretch with Doorway  - 1 x daily - 7 x weekly - 1 sets - 3 reps - 30 sec hold - Supine Lower Trunk Rotation  - 1 x daily - 7 x weekly - 1 sets - 10 reps - Supine Hip Internal and External Rotation  - 1 x daily - 7 x weekly - 1-2 sets - 10 reps - Modified Thomas Stretch  - 1 x daily - 7 x weekly - 1 sets - 3 reps - 30 sec hold - Standing Hip Flexor Stretch on Chair  - 1 x daily - 7 x weekly - 1 sets - 3 reps - 30 sec hold - Prone Quadriceps Stretch with Strap  - 1 x daily - 7 x weekly - 1 sets - 3 reps - 30  sec  hold - Standing Diagonal Chop  - 1 x daily - 7 x weekly - 3 sets - 10 reps - Reverse Chop with Resistance  - 1 x daily - 7 x weekly - 3 sets - 10 reps - Standing ITB Stretch  - 1 x daily - 7 x weekly - 3 reps - 30 sec hold - Quadruped Hip Abduction and External Rotation  - 1 x daily - 7 x weekly - 2 sets - 10 reps - Beginner Front Arm Support  - 1 x daily - 7 x weekly - 2 sets - 10 reps - Supine Bridge  - 1 x daily - 7 x weekly - 2 sets - 10 reps - Single Leg Balance with Clock Reach  - 1 x daily - 7 x weekly - 2 sets - 10 reps - Retro Step  - 1 x daily - 7 x weekly - 2 sets - 10 reps - Supine Bridge with Resistance Band  - 1 x daily - 7 x weekly - 2-3 sets - 10 reps - Supine Active Straight Leg Raise  - 1 x daily - 7 x weekly - 2-3 sets - 10 reps - Prone Hip Extension with Ankle Weight  - 1 x daily - 7 x weekly - 2-3 sets - 10 reps - Clamshell with Resistance  - 1 x daily - 7 x weekly - 2-3 sets - 10 reps - Single-Leg [place] Deadlift With Dumbbell  - 1 x daily - 3 x weekly - 2 sets - 10 reps  ASSESSMENT:  CLINICAL IMPRESSION: Terry Burton  continues to reports pain/tightness  and feeling of weakness over anterior L hip.  MRI is this Sunday.  Continued to work on hip strengthening today, with increased focus on hip flexors, he was able to perform all exercises without increased pain but noted some discomfort with SLR especially pulling in L hip flexor and weakness.  Advanced HEP and issued Blue theraband.  IASTM to L hip flexor, reported decreased tightness following interventions.  Terry Burton continues to demonstrate potential for improvement and would benefit from continued skilled therapy to address impairments.       OBJECTIVE IMPAIRMENTS: decreased activity tolerance, decreased ROM, increased fascial restrictions, increased muscle spasms, impaired flexibility, and pain.   ACTIVITY LIMITATIONS: carrying, lifting, and bending  PARTICIPATION LIMITATIONS: driving, community activity, and occupation  PERSONAL FACTORS: Time since onset of injury/illness/exacerbation and 1 comorbidity: history R THA, gout  are also affecting patient's functional outcome.   REHAB POTENTIAL: Excellent  CLINICAL DECISION MAKING: Stable/uncomplicated  EVALUATION COMPLEXITY: Low   GOALS: Goals reviewed with patient? Yes  SHORT TERM GOALS: Target date: 06/07/2023   Patient will be independent with initial HEP.  Baseline: given Goal status: MET  2.   Patient will report centralization of radicular symptoms.  Baseline: radiates down LLE to above L ankle Goal status: IN PROGRESS -  07/09/23 still having intermittent pain more localized to L hip and proximal thigh     LONG TERM GOALS: Target date: 07/19/2023   Patient will be independent with advanced/ongoing HEP to improve outcomes and carryover.  Baseline:  Goal status: IN PROGRESS - 07/09/23 - Pt reports no concerns with current HEP; updated today  2.  Patient will report 75% improvement in low back pain to improve QOL.  Baseline: 7-8/10 L side Goal status: IN PROGRESS - 07/09/23 - Pt reports  65-70% improvement  3.  Patient will demonstrate full pain free lumbar ROM to perform ADLs.  Baseline: see objective Goal status: IN PROGRESS  4.  Patient will be able to swing golf club without increased LBP to return to recreation/exercise.  Baseline: increased pain Goal status: IN PROGRESS  5.  Patient will report at least 6 points improvement on modified Oswestry to demonstrate improved functional ability.  Baseline: 13/50 Goal status: IN PROGRESS   PLAN:  PT FREQUENCY: 1x/week due to work  PT DURATION: 8 weeks  PLANNED INTERVENTIONS: 97110-Therapeutic exercises, 97530- Therapeutic activity, [id number]- Neuromuscular re-education, 97535- Self Care, [id number]- Manual therapy, [id number]- Electrical stimulation (unattended), 97035- Ultrasound, [id number]- Traction (mechanical), Patient/Family education, Balance training, Stair training, Taping, Dry Needling, Joint mobilization, Joint manipulation, Spinal manipulation, Spinal mobilization, Cryotherapy, and Moist heat.  PLAN FOR NEXT SESSION: Recert vs transition to HEP; continue to progress hip mobility, manual therapy TrDN to L QL, glutes, modalities PRN.   Jena Gauss, PT 07/23/2023, 11:46 AM

## 2023-07-25 ENCOUNTER — Ambulatory Visit
Admission: RE | Admit: 2023-07-25 | Discharge: 2023-07-25 | Disposition: A | Discharge status: Home / Self Care | Payer: BC Managed Care – PPO | Source: Ambulatory Visit | Attending: Physician Assistant | Admitting: Physician Assistant

## 2023-07-25 DIAGNOSIS — M1612 Unilateral primary osteoarthritis, left hip: Secondary | ICD-10-CM

## 2023-07-30 ENCOUNTER — Ambulatory Visit: Payer: BC Managed Care – PPO

## 2023-07-30 DIAGNOSIS — M5459 Other low back pain: Secondary | ICD-10-CM | POA: Diagnosis not present

## 2023-07-30 DIAGNOSIS — R252 Cramp and spasm: Secondary | ICD-10-CM

## 2023-07-30 NOTE — Therapy (Signed)
 OUTPATIENT PHYSICAL THERAPY TREATMENT    Patient Name: PESACH FRISCH MRN: [id number] DOB:10-Apr-1961, 63 y.o., male Today's Date: 07/30/2023  END OF SESSION:  PT End of Session - 07/30/23 1005     Visit Number 8    Date for PT Re-Evaluation 08/13/23    Authorization Type BC[employer]    PT Start Time [phone]   pt late   PT Stop Time 1014    PT Time Calculation (min) 38 min    Activity Tolerance Patient tolerated treatment well    Behavior During Therapy WFL for tasks assessed/performed               Past Medical History:  Diagnosis Date   Arthritis    right hip and back with pain down right leg   Gout    Past Surgical History:  Procedure Laterality Date   FRACTURE SURGERY     repair right elbow years ago- no retained hardware   TOTAL HIP ARTHROPLASTY  05/01/2011   Procedure: TOTAL HIP ARTHROPLASTY ANTERIOR APPROACH;  Surgeon: Kathryne Hitch;  Location: [facility];  Service: Orthopedics;  Laterality: Right;   Patient Active Problem List   Diagnosis Date Noted   Baker's cyst of knee, right 10/01/2020   Acute idiopathic gout of left knee 10/01/2020   Pseudogout of right knee 10/01/2020   Chronic pain of right knee 01/26/2017   Acute pain of right shoulder 12/10/2016   Right hip pain 12/10/2016   Hip arthritis 05/01/2011    PCP: Valinda Hoar, PA-C   REFERRING PROVIDER: Kathryne Hitch, MD   REFERRING DIAG: [phone] (ICD-10-CM) - Acute left-sided low back pain with left-sided sciatica   Rationale for Evaluation and Treatment: Rehabilitation  THERAPY DIAG:  Other low back pain  Cramp and spasm  ONSET DATE: 03/29/2023  NEXT MD VISIT:    SUBJECTIVE:                                                                                                                                                                                           SUBJECTIVE STATEMENT: Pt reports mild pain today. HE switched work boots but his new ones hurt worse than the old ones  so he switched back. Has MRI scheduled on 07/25/23.    PERTINENT HISTORY:  From MD notes " He was involved in an accident where a dock that he was fishing off of collapsed recently.  He was eventually seen at [facility] and had x-rays of the lumbar spine and his pelvis and left hip on October 28 of this year.  He still complains of oblique pain to the  left side and lower back pain the left side.  He denies any groin pain.  He does have some radicular symptoms going down his left leg.    He denies any change in bowel or bladder function or weakness in his legs."  PMH: R THA  PAIN:  Are you having pain? Yes: NPRS scale: 3/10 Pain location:  L hip/thigh Pain description: comes and goes, stabbing  Aggravating factors: twitsting, certain movements, especially moving hip, golf Relieving factors: relaxing, tylenol  PRECAUTIONS: None  RED FLAGS: None   WEIGHT BEARING RESTRICTIONS: No  FALLS:  Has patient fallen in last 6 months? 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 The remainder of the disc spaces are maintained. No static listhesis. No spondylolysis.   Prior right total hip arthroplasty in stable alignment. There is no evidence of hip fracture or dislocation. The left femoral head is seated within the acetabulum. Degenerative changes of the left hip with joint space narrowing. The sacroiliac joints and pubic symphysis are anatomically aligned.   PATIENT SURVEYS:  Modified Oswestry 13/50   COGNITION: Overall cognitive status: Within functional limits for tasks  assessed     SENSATION: WFL  MUSCLE LENGTH: Hamstrings: Right 90 deg; Left 90 deg mild tightness bil   POSTURE: No Significant postural limitations  PALPATION: Tenderness L QL, L glute medius, L greater trochanter  LUMBAR ROM:   AROM eval 07/30/23  Flexion To ankles, p! left To ankles- sore  Extension 75% limited, stiff 65%- sore  Right lateral flexion WNL   Left lateral flexion WNL, p!   Right rotation WNL, p!   Left rotation WNL    (Blank rows = not tested)  LOWER EXTREMITY ROM:      Tightness bilateral hips, history of R TKA, limited hip IR bilateally  LOWER EXTREMITY MMT:    MMT Right eval Left eval  Hip flexion 5 4p!  Hip extension    Hip abduction 5 5  Hip adduction 5 5  Knee flexion 5 5  Knee extension 5 5  Ankle dorsiflexion 5 5  Ankle plantarflexion     (Blank rows = not tested)  LUMBAR SPECIAL TESTS:  Straight leg raise test: Negative and Long sit test: Positive Negative retest after MET  FUNCTIONAL TESTS:  5 times sit to stand: 10.5 seconds, no UE assist, table height 20" ( to put hips/knees at 90/90)  GAIT: Distance walked: 44' Assistive device utilized: None Level of assistance: Complete Independence Comments: no deviation  TODAY'S TREATMENT:  DATE:  07/30/23 Therapeutic Exercise: to improve strength and mobility.  Demo, verbal and tactile cues throughout for technique. Bike L2 x 6 min  Bridge blue TB x 20 Clamshell S/L blue TB x 20 bil S/L hip abduction 2x10 bil Supine HS curl using orange pball x 10 Supine trunk rotation using orange pball x 10  Seated hip step up and over x 10 bil  07/23/23 Therapeutic Exercise: to improve strength and mobility.  Demo, verbal and tactile cues throughout for technique. Bike L2 x 6 min  Bridges x 10 Bridge with BTB x 20 Clamshell S/L BTB x 15 SLR 2 x 10 r/l Prone hip extension 2  x 10 r/l Standing single leg RDL 2 x 10 R/L - table in front and SBA for safety Standing hip flexor stretch Manual Therapy: to decrease muscle spasm and pain and improve mobility IASTM with s/s edge tool to L hip flexor     07/16/23 Therapeutic Exercise: to improve strength and mobility.  Demo, verbal and tactile cues throughout for technique. Bike L2 x 6 min  Fire Hydrants 2 x 8 r/l Leg extensions quadruped 2 x 5 r/l  Quad leg extensions 2 x 5 r/l Retro step on L x 10  Standing clock exercise 2 x 5 r/l Bridges 2 x 10 - pulling over hip but tolerable Manual Therapy: to decrease muscle spasm and pain and improve mobility Distraction of femoral acetabular joint with belt  07/09/23 THERAPEUTIC EXERCISE: To improve strength, endurance, ROM, and flexibility.  Demonstration, verbal and tactile cues throughout for technique.  Rec Bike - L3 x 6 min S/L L QL stretch with light manual overpressure by PT x 60" Mod thomas L hip flexor stretch with light manual overpressure by PT 2 x 60" Standing side bending L ITB/TFL stretch 3 x 30" Standing L QL stretch at door frame 2 x 30"  MANUAL THERAPY: To promote normalized muscle tension, improved flexibility, improved joint mobility, increased ROM, and reduced pain. Trigger Point Dry Needling: Treatment instructions/education: Subsequent Treatment: Instructions provided previously at initial dry needling treatment.  Education Handout Provided: Previously Provided Consent: Patient Verbal Consent Given: Yes Treatment: Muscles Treated: L iliacus, L TFL and L QL Skilled palpation and monitoring of soft tissue during DN Electrical Stimulation Performed: No Treatment Response/Outcome: Twitch Response Elicited, Palpable Increase in Muscle Length, Decreased TTP, and Improved Exercise Tolerance STM/DTM, manual TPR and pin & stretch to muscles addressed with DN   06/28/23 Therapeutic Exercise: to improve strength and mobility.  Demo, verbal and tactile cues  throughout for technique. Nustep L5 x 6 min  Hip IR/ER  Standing hip openers x 10 Chops GTB x 10 Reverse chops GTB x 10  Manual Therapy: to decrease muscle spasm and pain and improve mobility L hip- femoral/acetabular distraction IASTM to L hip flexor   PATIENT EDUCATION:  Education details:HEP update Person educated: Patient Education method: Programmer, multimedia, Demonstration, Verbal cues, and Handouts Education comprehension: verbalized understanding and returned demonstration  HOME EXERCISE PROGRAM: Access Code: [email or web address] URL: [email or web address] Date: 07/23/2023 Prepared by: Harrie Foreman  Exercises - Standing Quadratus Lumborum Stretch with Doorway  - 1 x daily - 7 x weekly - 1 sets - 3 reps - 30 sec hold - Supine Lower Trunk Rotation  - 1 x daily - 7 x weekly - 1 sets - 10 reps - Supine Hip Internal and External Rotation  - 1 x daily - 7 x weekly - 1-2 sets - 10 reps - Modified Erby Pian  -  1 x daily - 7 x weekly - 1 sets - 3 reps - 30 sec hold - Standing Hip Flexor Stretch on Chair  - 1 x daily - 7 x weekly - 1 sets - 3 reps - 30 sec hold - Prone Quadriceps Stretch with Strap  - 1 x daily - 7 x weekly - 1 sets - 3 reps - 30 sec  hold - Standing Diagonal Chop  - 1 x daily - 7 x weekly - 3 sets - 10 reps - Reverse Chop with Resistance  - 1 x daily - 7 x weekly - 3 sets - 10 reps - Standing ITB Stretch  - 1 x daily - 7 x weekly - 3 reps - 30 sec hold - Quadruped Hip Abduction and External Rotation  - 1 x daily - 7 x weekly - 2 sets - 10 reps - Beginner Front Arm Support  - 1 x daily - 7 x weekly - 2 sets - 10 reps - Supine Bridge  - 1 x daily - 7 x weekly - 2 sets - 10 reps - Single Leg Balance with Clock Reach  - 1 x daily - 7 x weekly - 2 sets - 10 reps - Retro Step  - 1 x daily - 7 x weekly - 2 sets - 10 reps - Supine Bridge with Resistance Band  - 1 x daily - 7 x weekly - 2-3 sets - 10 reps - Supine Active Straight Leg Raise  - 1 x daily - 7 x weekly -  2-3 sets - 10 reps - Prone Hip Extension with Ankle Weight  - 1 x daily - 7 x weekly - 2-3 sets - 10 reps - Clamshell with Resistance  - 1 x daily - 7 x weekly - 2-3 sets - 10 reps - Single-Leg [place] Deadlift With Dumbbell  - 1 x daily - 3 x weekly - 2 sets - 10 reps  ASSESSMENT:  CLINICAL IMPRESSION: Mr. Sass shows slight improvement in his lumbar extension. He is very stiff with overall trunk mobility and hip mobility. Continued hip strengthening and added trunk mobility exercises. Pt tolerated the proression of exercises well, with no increased pain. He see's the PA on Thurs 3/6.   PERLE GIBBON continues to demonstrate potential for improvement and would benefit from continued skilled therapy to address impairments.       OBJECTIVE IMPAIRMENTS: decreased activity tolerance, decreased ROM, increased fascial restrictions, increased muscle spasms, impaired flexibility, and pain.   ACTIVITY LIMITATIONS: carrying, lifting, and bending  PARTICIPATION LIMITATIONS: driving, community activity, and occupation  PERSONAL FACTORS: Time since onset of injury/illness/exacerbation and 1 comorbidity: history R THA, gout  are also affecting patient's functional outcome.   REHAB POTENTIAL: Excellent  CLINICAL DECISION MAKING: Stable/uncomplicated  EVALUATION COMPLEXITY: Low   GOALS: Goals reviewed with patient? Yes  SHORT TERM GOALS: Target date: 06/07/2023   Patient will be independent with initial HEP.  Baseline: given Goal status: MET  2.   Patient will report centralization of radicular symptoms.  Baseline: radiates down LLE to above L ankle Goal status: IN PROGRESS -  07/09/23 still having intermittent pain more localized to L hip and proximal thigh     LONG TERM GOALS: Target date: 07/19/2023 extended to 08/13/23 Patient will be independent with advanced/ongoing HEP to improve outcomes and carryover.  Baseline:  Goal status: IN PROGRESS - 07/09/23 - Pt reports no concerns with  current HEP; updated today  2.  Patient will report  75% improvement in low back pain to improve QOL.  Baseline: 7-8/10 L side Goal status: IN PROGRESS - 07/09/23 - Pt reports 65-70% improvement  3.  Patient will demonstrate full pain free lumbar ROM to perform ADLs.   Baseline: see objective Goal status: IN PROGRESS  4.  Patient will be able to swing golf club without increased LBP to return to recreation/exercise.  Baseline: increased pain Goal status: IN PROGRESS  5.  Patient will report at least 6 points improvement on modified Oswestry to demonstrate improved functional ability.  Baseline: 13/50 Goal status: IN PROGRESS   PLAN:  PT FREQUENCY: 1x/week due to work  PT DURATION: 8 weeks  PLANNED INTERVENTIONS: 97110-Therapeutic exercises, 97530- Therapeutic activity, [id number]- Neuromuscular re-education, 97535- Self Care, [id number]- Manual therapy, 97014- Electrical stimulation (unattended), 97035- Ultrasound, 62130- Traction (mechanical), Patient/Family education, Balance training, Stair training, Taping, Dry Needling, Joint mobilization, Joint manipulation, Spinal manipulation, Spinal mobilization, Cryotherapy, and Moist heat.  PLAN FOR NEXT SESSION: continue to progress hip mobility, manual therapy TrDN to L QL, glutes, modalities PRN.   Darleene Cleaver, PTA 07/30/2023, 10:47 AM

## 2023-08-05 ENCOUNTER — Ambulatory Visit: Payer: BC Managed Care – PPO | Admitting: Physician Assistant

## 2023-08-06 ENCOUNTER — Encounter: Payer: Self-pay | Admitting: Physical Therapy

## 2023-08-13 ENCOUNTER — Ambulatory Visit: Payer: Self-pay | Attending: Orthopaedic Surgery | Admitting: Physical Therapy

## 2023-08-13 ENCOUNTER — Encounter: Payer: Self-pay | Admitting: Physical Therapy

## 2023-08-13 DIAGNOSIS — R252 Cramp and spasm: Secondary | ICD-10-CM | POA: Diagnosis present

## 2023-08-13 DIAGNOSIS — M5459 Other low back pain: Secondary | ICD-10-CM | POA: Insufficient documentation

## 2023-08-13 NOTE — Therapy (Signed)
 OUTPATIENT PHYSICAL THERAPY TREATMENT/RECERT    Patient Name: EPHRAIM REICHEL MRN: [id number] DOB:[birth year], 63 y.o., male Today's Date: 08/13/2023  END OF SESSION:  PT End of Session - 08/13/23 0939     Visit Number 9    Date for PT Re-Evaluation 09/24/23    Authorization Type [employer]    PT Start Time [phone]    PT Stop Time 1023    PT Time Calculation (min) 47 min    Activity Tolerance Patient tolerated treatment well    Behavior During Therapy WFL for tasks assessed/performed               Past Medical History:  Diagnosis Date   Arthritis    right hip and back with pain down right leg   Gout    Past Surgical History:  Procedure Laterality Date   FRACTURE SURGERY     repair right elbow years ago- no retained hardware   TOTAL HIP ARTHROPLASTY  05/01/2011   Procedure: TOTAL HIP ARTHROPLASTY ANTERIOR APPROACH;  Surgeon: Kathryne Hitch;  Location: [facility];  Service: Orthopedics;  Laterality: Right;   Patient Active Problem List   Diagnosis Date Noted   Baker's cyst of knee, right 10/01/2020   Acute idiopathic gout of left knee 10/01/2020   Pseudogout of right knee 10/01/2020   Chronic pain of right knee 01/26/2017   Acute pain of right shoulder 12/10/2016   Right hip pain 12/10/2016   Hip arthritis 05/01/2011    PCP: Valinda Hoar, PA-C   REFERRING PROVIDER: Kathryne Hitch, MD   REFERRING DIAG: [id number] (ICD-10-CM) - Acute left-sided low back pain with left-sided sciatica   Rationale for Evaluation and Treatment: Rehabilitation  THERAPY DIAG:  Other low back pain - Plan: PT plan of care cert/re-cert  Cramp and spasm - Plan: PT plan of care cert/re-cert  ONSET DATE: 03/29/2023  NEXT MD VISIT:  needs to be scheduled   SUBJECTIVE:                                                                                                                                                                                           SUBJECTIVE  STATEMENT: Reports suddenly started to have LLE from hip to knee last couple of days, limping pain was so bad, put on icy hot, woke up ok today, not sure what happened.   PERTINENT HISTORY:  From MD notes " He was involved in an accident where a dock that he was fishing off of collapsed recently.  He was eventually seen at [facility] and had x-rays of the lumbar spine and his pelvis and  left hip on October 28 of this year.  He still complains of oblique pain to the left side and lower back pain the left side.  He denies any groin pain.  He does have some radicular symptoms going down his left leg.    He denies any change in bowel or bladder function or weakness in his legs."  PMH: R THA  PAIN:  Are you having pain? Yes: NPRS scale: 3/10 Pain location:  L hip/thigh Pain description: comes and goes, stabbing  Aggravating factors: twitsting, certain movements, especially moving hip, golf Relieving factors: relaxing, tylenol  PRECAUTIONS: None  RED FLAGS: None   WEIGHT BEARING RESTRICTIONS: No  FALLS:  Has patient fallen in last 6 months? No  LIVING ENVIRONMENT: Lives with: lives with their family Lives in: House/apartment Stairs: Yes: Internal: 14 steps; on left going up Has following equipment at home: None  OCCUPATION: drive for [employer]  PLOF: Independent and Leisure: golf  PATIENT GOALS: get rid of pain    OBJECTIVE:   DIAGNOSTIC FINDINGS:  07/25/2023 MRI  L HIP IMPRESSION: 1. Avascular necrosis of the left femoral head with small subchondral fracture at the femoral head apex. Preservation of the femoral head contour without flattening or collapse. 2. Moderate osteoarthritis of the left hip. 3. Superior labral tear.  DG L Hip  and lumbar spine 03/29/23  FINDINGS: Disc height loss and endplate osteophytosis at L5-S1. The remainder of the disc spaces are maintained. No static listhesis. No spondylolysis.   Prior right total hip arthroplasty in stable  alignment. There is no evidence of hip fracture or dislocation. The left femoral head is seated within the acetabulum. Degenerative changes of the left hip with joint space narrowing. The sacroiliac joints and pubic symphysis are anatomically aligned.   PATIENT SURVEYS:  Modified Oswestry 13/50   COGNITION: Overall cognitive status: Within functional limits for tasks assessed     SENSATION: WFL  MUSCLE LENGTH: Hamstrings: Right 90 deg; Left 90 deg mild tightness bil   POSTURE: No Significant postural limitations  PALPATION: Tenderness L QL, L glute medius, L greater trochanter  LUMBAR ROM:   AROM eval 07/30/23 08/13/23  Flexion To ankles, p! left To ankles- sore To ankles  Extension 75% limited, stiff 65%- sore WNL, pain L side  Right lateral flexion WNL  WNL  Left lateral flexion WNL, p!  WNL, small pain on L  Right rotation WNL, p!  WNL, small pain on L  Left rotation WNL  WNL   (Blank rows = not tested)  LOWER EXTREMITY ROM:      Tightness bilateral hips, history of R TKA, limited hip IR bilateally  LOWER EXTREMITY MMT:    MMT Right eval Left eval  Hip flexion 5 4p!  Hip extension    Hip abduction 5 5  Hip adduction 5 5  Knee flexion 5 5  Knee extension 5 5  Ankle dorsiflexion 5 5  Ankle plantarflexion     (Blank rows = not tested)  LUMBAR SPECIAL TESTS:  Straight leg raise test: Negative and Long sit test: Positive Negative retest after MET  FUNCTIONAL TESTS:  5 times sit to stand: 10.5 seconds, no UE assist, table height 20" ( to put hips/knees at 90/90)  GAIT: Distance walked: 74' Assistive device utilized: None Level of assistance: Complete Independence Comments: no deviation  TODAY'S TREATMENT:  DATE:   08/13/23 Therapeutic Exercise: to improve strength and mobility.  Demo, verbal and tactile cues throughout for  technique. Bike L2 x 6 min  Bridges x 10 Bridges with ball squeeze x 10 SLR 2 x 10  Hip abduction 2 x 10 - difficult today Therapeutic Activity:  review of goals Mod Oswestry 7/50 Lumbar AROM Review of Imaging Discussion of shoes  POC Manual Therapy: to decrease muscle spasm and pain and improve mobility STM/TPR to lumbar paraspinals, L QL, L UPA mobs lumbar spine, skilled palpation and monitoring during dry needling. Trigger Point Dry Needling  Subsequent Treatment: Instructions provided previously at initial dry needling treatment.  Instructions reviewed, if requested by the patient, prior to subsequent dry needling treatment.   Patient Verbal Consent Given: Yes Education Handout Provided: Previously Provided Muscles Treated: L QL, L lumbar multifidi L4/5 Electrical Stimulation Performed: No Treatment Response/Outcome: Twitch Response Elicited and Palpable Increase in Muscle Length   07/30/23 Therapeutic Exercise: to improve strength and mobility.  Demo, verbal and tactile cues throughout for technique. Bike L2 x 6 min  Bridge blue TB x 20 Clamshell S/L blue TB x 20 bil S/L hip abduction 2x10 bil Supine HS curl using orange pball x 10 Supine trunk rotation using orange pball x 10  Seated hip step up and over x 10 bil  07/23/23 Therapeutic Exercise: to improve strength and mobility.  Demo, verbal and tactile cues throughout for technique. Bike L2 x 6 min  Bridges x 10 Bridge with BTB x 20 Clamshell S/L BTB x 15 SLR 2 x 10 r/l Prone hip extension 2 x 10 r/l Standing single leg RDL 2 x 10 R/L - table in front and SBA for safety Standing hip flexor stretch Manual Therapy: to decrease muscle spasm and pain and improve mobility IASTM with s/s edge tool to L hip flexor      PATIENT EDUCATION:  Education details: HEP review Person educated: Patient Education method: Programmer, multimedia, Facilities manager, Verbal cues, and Handouts Education comprehension: verbalized understanding  and returned demonstration  HOME EXERCISE PROGRAM: Access Code: [email or web address] URL: [email or web address] Date: 07/23/2023 Prepared by: Harrie Foreman  Exercises - Standing Quadratus Lumborum Stretch with Doorway  - 1 x daily - 7 x weekly - 1 sets - 3 reps - 30 sec hold - Supine Lower Trunk Rotation  - 1 x daily - 7 x weekly - 1 sets - 10 reps - Supine Hip Internal and External Rotation  - 1 x daily - 7 x weekly - 1-2 sets - 10 reps - Modified Thomas Stretch  - 1 x daily - 7 x weekly - 1 sets - 3 reps - 30 sec hold - Standing Hip Flexor Stretch on Chair  - 1 x daily - 7 x weekly - 1 sets - 3 reps - 30 sec hold - Prone Quadriceps Stretch with Strap  - 1 x daily - 7 x weekly - 1 sets - 3 reps - 30 sec  hold - Standing Diagonal Chop  - 1 x daily - 7 x weekly - 3 sets - 10 reps - Reverse Chop with Resistance  - 1 x daily - 7 x weekly - 3 sets - 10 reps - Standing ITB Stretch  - 1 x daily - 7 x weekly - 3 reps - 30 sec hold - Quadruped Hip Abduction and External Rotation  - 1 x daily - 7 x weekly - 2 sets - 10 reps - Beginner Front Arm Support  - 1 x daily -  7 x weekly - 2 sets - 10 reps - Supine Bridge  - 1 x daily - 7 x weekly - 2 sets - 10 reps - Single Leg Balance with Clock Reach  - 1 x daily - 7 x weekly - 2 sets - 10 reps - Retro Step  - 1 x daily - 7 x weekly - 2 sets - 10 reps - Supine Bridge with Resistance Band  - 1 x daily - 7 x weekly - 2-3 sets - 10 reps - Supine Active Straight Leg Raise  - 1 x daily - 7 x weekly - 2-3 sets - 10 reps - Prone Hip Extension with Ankle Weight  - 1 x daily - 7 x weekly - 2-3 sets - 10 reps - Clamshell with Resistance  - 1 x daily - 7 x weekly - 2-3 sets - 10 reps - Single-Leg [place] Deadlift With Dumbbell  - 1 x daily - 3 x weekly - 2 sets - 10 reps  ASSESSMENT:  CLINICAL IMPRESSION: Imaging shows that Mr. Akia has AVN of L hip and labral tear.  He has not had the opportunity to have follow-up with orthopedist, so encouraged him  to reschedule appt. He did have increased L hip groin to knee pain, but this seems to be aggravated by new boots, recommended returning them and getting different pair, as continue to hurt feet and change gait, aggravating.  While he has met goal for Oswestry, with L hip pain worsening, recommend continued skilled therapy for additional 6 weeks (1x/week due to work schedule) to continue strengthening and addressing pain.  JAYLYNN SIEFERT continues to demonstrate potential for improvement and would benefit from continued skilled therapy to address impairments.       OBJECTIVE IMPAIRMENTS: decreased activity tolerance, decreased ROM, increased fascial restrictions, increased muscle spasms, impaired flexibility, and pain.   ACTIVITY LIMITATIONS: carrying, lifting, and bending  PARTICIPATION LIMITATIONS: driving, community activity, and occupation  PERSONAL FACTORS: Time since onset of injury/illness/exacerbation and 1 comorbidity: history R THA, gout  are also affecting patient's functional outcome.   REHAB POTENTIAL: Excellent  CLINICAL DECISION MAKING: Stable/uncomplicated  EVALUATION COMPLEXITY: Low   GOALS: Goals reviewed with patient? Yes  SHORT TERM GOALS: Target date: 06/07/2023   Patient will be independent with initial HEP.  Baseline: given Goal status: MET  2.   Patient will report centralization of radicular symptoms.  Baseline: radiates down LLE to above L ankle Goal status: IN PROGRESS -  07/09/23 still having intermittent pain more localized to L hip and proximal thigh     LONG TERM GOALS: Target date:  extended to 09/24/23 Patient will be independent with advanced/ongoing HEP to improve outcomes and carryover.  Baseline:  Goal status: IN PROGRESS - 07/09/23 - Pt reports no concerns with current HEP; updated today  2.  Patient will report 75% improvement in low back pain to improve QOL.  Baseline: 7-8/10 L side Goal status: IN PROGRESS - 07/09/23 - Pt reports 65-70%  improvement   3.  Patient will demonstrate full pain free lumbar ROM to perform ADLs.   Baseline: see objective Goal status: IN PROGRESS 08/13/23 3-4/10 LBP with lumbar AROM  4.  Patient will be able to swing golf club without increased LBP to return to recreation/exercise.  Baseline: increased pain Goal status: IN PROGRESS 08/13/23- driving Saturday sore afterwards.   5.  Patient will report at least 6 points improvement on modified Oswestry to demonstrate improved functional ability.  Baseline: 13/50 Goal  status: MET 08/13/23 7/50  PLAN:  PT FREQUENCY: 1x/week due to work  PT DURATION: 8 weeks  PLANNED INTERVENTIONS: 97110-Therapeutic exercises, 97530- Therapeutic activity, 97112- Neuromuscular re-education, 97535- Self Care, 54098- Manual therapy, 97014- Electrical stimulation (unattended), 97035- Ultrasound, 11914- Traction (mechanical), Patient/Family education, Balance training, Stair training, Taping, Dry Needling, Joint mobilization, Joint manipulation, Spinal manipulation, Spinal mobilization, Cryotherapy, and Moist heat.  PLAN FOR NEXT SESSION: continue to progress hip mobility, manual therapy TrDN to L QL, glutes, modalities PRN.   Jena Gauss, PT 08/13/2023, 12:14 PM

## 2023-08-16 ENCOUNTER — Telehealth: Payer: Self-pay | Admitting: Orthopaedic Surgery

## 2023-08-16 ENCOUNTER — Other Ambulatory Visit: Payer: Self-pay | Admitting: Physician Assistant

## 2023-08-16 MED ORDER — IBUPROFEN 400 MG PO TABS: 400.0000 mg | ORAL_TABLET | Freq: Four times a day (QID) | ORAL | 0 refills | Status: DC | PRN

## 2023-08-16 NOTE — Telephone Encounter (Signed)
 Pt came in office requesting a medication refill of ibuprofen 800mg . [facility] on [address], [place], [place] [address]

## 2023-08-18 ENCOUNTER — Encounter: Payer: Self-pay | Admitting: Physician Assistant

## 2023-08-18 ENCOUNTER — Ambulatory Visit: Admitting: Physician Assistant

## 2023-08-18 DIAGNOSIS — M87052 Idiopathic aseptic necrosis of left femur: Secondary | ICD-10-CM

## 2023-08-18 NOTE — Progress Notes (Signed)
 HPI: Mr. Burley returns today to go over the MRI of his left hip.  Continues to have pain in the hip.  Does radiate down the leg at times.  However notes some numbness tingling at x 2.  Main complaint is groin pain.  Pain has been getting worse with recent activities at work.  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  Patient with a history of right total hip arthroplasty by [name] secondary to AVN.  Right hip overall doing well.  Again patient did sustain the injuries when a dock he was fishing off of the collapsed.  He has had increased left groin pain since then.  Previous x-rays prior to the injury did show some minimal flattening of the superior lateral femoral head.  MRI left hip without contrast is reviewed with the patient.  Images shown.  Left femoral head AVN with a small area of subchondral fracture.  No evidence of collapse.  Moderate to moderately severe arthritis left hip.  Superior labral tear.  Impression: Left hip avascular necrosis  Plan: Explained to the patient that given his MRI findings he could benefit from total hip replacement.  Also explained to him that given his prior history of AVN in the right hip there is a higher incidence of having again in the opposite hip.  Discussed that AVN can be caused by alcohol consumption, steroid use, trauma and often for unknown reasons.  He will let us know if he would like to proceed with left total hip arthroplasty in the near future.  Explained to them again that this is an elective procedure.  Discussed with him that would not take any oral steroids or having any further intra-articular injections in the left hip.  Questions were encouraged and answered.

## 2023-08-25 ENCOUNTER — Ambulatory Visit: Admitting: Physician Assistant

## 2023-08-27 ENCOUNTER — Ambulatory Visit: Payer: Self-pay | Admitting: Physical Therapy

## 2023-08-27 ENCOUNTER — Encounter: Payer: Self-pay | Admitting: Physical Therapy

## 2023-08-27 DIAGNOSIS — R252 Cramp and spasm: Secondary | ICD-10-CM

## 2023-08-27 DIAGNOSIS — M5459 Other low back pain: Secondary | ICD-10-CM | POA: Diagnosis not present

## 2023-08-27 NOTE — Therapy (Signed)
 OUTPATIENT PHYSICAL THERAPY TREATMENT    Patient Name: Terry Burton MRN: [id number] DOB:1961/03/16, 63 y.o., male Today's Date: 08/27/2023  END OF SESSION:  PT End of Session - 08/27/23 0847     Visit Number 10    Date for PT Re-Evaluation 09/24/23    Authorization Type [employer]    PT Start Time [phone]    PT Stop Time 0930    PT Time Calculation (min) 43 min    Activity Tolerance Patient tolerated treatment well    Behavior During Therapy WFL for tasks assessed/performed                Past Medical History:  Diagnosis Date   Arthritis    right hip and back with pain down right leg   Gout    Past Surgical History:  Procedure Laterality Date   FRACTURE SURGERY     repair right elbow years ago- no retained hardware   TOTAL HIP ARTHROPLASTY  05/01/2011   Procedure: TOTAL HIP ARTHROPLASTY ANTERIOR APPROACH;  Surgeon: Kathryne Hitch;  Location: [facility];  Service: Orthopedics;  Laterality: Right;   Patient Active Problem List   Diagnosis Date Noted   Baker's cyst of knee, right 10/01/2020   Acute idiopathic gout of left knee 10/01/2020   Pseudogout of right knee 10/01/2020   Chronic pain of right knee 01/26/2017   Acute pain of right shoulder 12/10/2016   Right hip pain 12/10/2016   Hip arthritis 05/01/2011    PCP: Valinda Hoar, PA-C   REFERRING PROVIDER: Kathryne Hitch, MD   REFERRING DIAG: [phone] (ICD-10-CM) - Acute left-sided low back pain with left-sided sciatica   Rationale for Evaluation and Treatment: Rehabilitation  THERAPY DIAG:  Other low back pain  Cramp and spasm  ONSET DATE: 03/29/2023  NEXT MD VISIT:  needs to be scheduled   SUBJECTIVE:                                                                                                                                                                                           SUBJECTIVE STATEMENT: Sam reports he saw the PA for the new pain in his hip but did not feel like  he was given any guidance on what needs to be done or whether he should continue with PT.  PAIN:  Are you having pain? Yes: NPRS scale: 10/10 Pain location:  L hip/thigh Pain description: comes and goes, stabbing  Aggravating factors: twitsting, certain movements, especially moving hip, golf Relieving factors: relaxing, tylenol  PERTINENT HISTORY:  From MD notes " He was involved in an accident where a dock that he  was fishing off of collapsed recently.  He was eventually seen at [facility] and had x-rays of the lumbar spine and his pelvis and left hip on October 28 of this year.  He still complains of oblique pain to the left side and lower back pain the left side.  He denies any groin pain.  He does have some radicular symptoms going down his left leg.    He denies any change in bowel or bladder function or weakness in his legs."  PMH: R THA   PRECAUTIONS: None  RED FLAGS: None   WEIGHT BEARING RESTRICTIONS: No  FALLS:  Has patient fallen in last 6 months? No  LIVING ENVIRONMENT: Lives with: lives with their family Lives in: House/apartment Stairs: Yes: Internal: 14 steps; on left going up Has following equipment at home: None  OCCUPATION: drive for [employer]  PLOF: Independent and Leisure: golf  PATIENT GOALS: get rid of pain    OBJECTIVE:   DIAGNOSTIC FINDINGS:  07/25/2023 MRI  L HIP IMPRESSION: 1. Avascular necrosis of the left femoral head with small subchondral fracture at the femoral head apex. Preservation of the femoral head contour without flattening or collapse. 2. Moderate osteoarthritis of the left hip. 3. Superior labral tear.  DG L Hip  and lumbar spine 03/29/23  FINDINGS: Disc height loss and endplate osteophytosis at L5-S1. The remainder of the disc spaces are maintained. No static listhesis. No spondylolysis.   Prior right total hip arthroplasty in stable alignment. There is no evidence of hip fracture or dislocation. The left femoral  head is seated within the acetabulum. Degenerative changes of the left hip with joint space narrowing. The sacroiliac joints and pubic symphysis are anatomically aligned.   PATIENT SURVEYS:  Modified Oswestry 13/50   COGNITION: Overall cognitive status: Within functional limits for tasks assessed     SENSATION: WFL  MUSCLE LENGTH: Hamstrings: Right 90 deg; Left 90 deg mild tightness bil   POSTURE: No Significant postural limitations  PALPATION: Tenderness L QL, L glute medius, L greater trochanter  LUMBAR ROM:   AROM eval 07/30/23 08/13/23  Flexion To ankles, p! left To ankles- sore To ankles  Extension 75% limited, stiff 65%- sore WNL, pain L side  Right lateral flexion WNL  WNL  Left lateral flexion WNL, p!  WNL, small pain on L  Right rotation WNL, p!  WNL, small pain on L  Left rotation WNL  WNL   (Blank rows = not tested)  LOWER EXTREMITY ROM:      Tightness bilateral hips, history of R TKA, limited hip IR bilateally  LOWER EXTREMITY MMT:    MMT Right eval Left eval  Hip flexion 5 4p!  Hip extension    Hip abduction 5 5  Hip adduction 5 5  Knee flexion 5 5  Knee extension 5 5  Ankle dorsiflexion 5 5  Ankle plantarflexion     (Blank rows = not tested)  LUMBAR SPECIAL TESTS:  Straight leg raise test: Negative and Long sit test: Positive Negative retest after MET  FUNCTIONAL TESTS:  5 times sit to stand: 10.5 seconds, no UE assist, table height 20" ( to put hips/knees at 90/90)  GAIT: Distance walked: 31' Assistive device utilized: None Level of assistance: Complete Independence Comments: no deviation  TODAY'S TREATMENT:  DATE:   08/27/23 THERAPEUTIC EXERCISE: To improve strength, endurance, ROM, and flexibility.  Demonstration, verbal and tactile cues throughout for technique.  TM 2.6 mph x 6 min Hooklying L KTOS piriformis  stretch x 30" Mod thomas L hip flexor stretch x 30" Standing L hip openers x 10  MANUAL THERAPY: To promote normalized muscle tension, improved flexibility, improved joint mobility, increased ROM, and reduced pain. Trigger Point Dry Needling: Treatment instructions/education: Subsequent Treatment: Instructions provided previously at initial dry needling treatment.  Education Handout Provided: Previously Provided Consent: Patient Verbal Consent Given: Yes Treatment: Muscles Treated: L glute maximus and medius, L iliacus, L TFL Skilled palpation and monitoring of soft tissue during DN Electrical Stimulation Performed: No Treatment Response/Outcome: Twitch response elicited, Palpable increase in muscle length, Decreased tissue resistance noted, Decreased TTP, and Improved exercise tolerance STM/DTM, manual TPR and pin & stretch to muscles addressed with DN Grade I-II L hip inf, A/P and distraction mobs for pain relief    08/13/23 - Recert Therapeutic Exercise: to improve strength and mobility.  Demo, verbal and tactile cues throughout for technique. Bike L2 x 6 min  Bridges x 10 Bridges with ball squeeze x 10 SLR 2 x 10  Hip abduction 2 x 10 - difficult today Therapeutic Activity:  review of goals Mod Oswestry 7/50 Lumbar AROM Review of Imaging Discussion of shoes  POC Manual Therapy: to decrease muscle spasm and pain and improve mobility STM/TPR to lumbar paraspinals, L QL, L UPA mobs lumbar spine, skilled palpation and monitoring during dry needling. Trigger Point Dry Needling  Subsequent Treatment: Instructions provided previously at initial dry needling treatment.  Instructions reviewed, if requested by the patient, prior to subsequent dry needling treatment.   Patient Verbal Consent Given: Yes Education Handout Provided: Previously Provided Muscles Treated: L QL, L lumbar multifidi L4/5 Electrical Stimulation Performed: No Treatment Response/Outcome: Twitch Response  Elicited and Palpable Increase in Muscle Length   07/30/23  Therapeutic Exercise: to improve strength and mobility.  Demo, verbal and tactile cues throughout for technique. Bike L2 x 6 min  Bridge blue TB x 20 Clamshell S/L blue TB x 20 bil S/L hip abduction 2x10 bil Supine HS curl using orange pball x 10 Supine trunk rotation using orange pball x 10  Seated hip step up and over x 10 bil   07/23/23  Therapeutic Exercise: to improve strength and mobility.  Demo, verbal and tactile cues throughout for technique. Bike L2 x 6 min  Bridges x 10 Bridge with BTB x 20 Clamshell S/L BTB x 15 SLR 2 x 10 r/l Prone hip extension 2 x 10 r/l Standing single leg RDL 2 x 10 R/L - table in front and SBA for safety Standing hip flexor stretch Manual Therapy: to decrease muscle spasm and pain and improve mobility IASTM with s/s edge tool to L hip flexor    PATIENT EDUCATION:  Education details: role of DN and DN rational, procedure, outcomes, potential side effects, and recommended post-treatment exercises/activity  Person educated: Patient Education method: Explanation Education comprehension: verbalized understanding  HOME EXERCISE PROGRAM: Access Code: [email or web address] URL: [email or web address] Date: 07/23/2023 Prepared by: Harrie Foreman  Exercises - Standing Quadratus Lumborum Stretch with Doorway  - 1 x daily - 7 x weekly - 1 sets - 3 reps - 30 sec hold - Supine Lower Trunk Rotation  - 1 x daily - 7 x weekly - 1 sets - 10 reps - Supine Hip Internal and External Rotation  - 1 x daily - 7  x weekly - 1-2 sets - 10 reps - Modified Thomas Stretch  - 1 x daily - 7 x weekly - 1 sets - 3 reps - 30 sec hold - Standing Hip Flexor Stretch on Chair  - 1 x daily - 7 x weekly - 1 sets - 3 reps - 30 sec hold - Prone Quadriceps Stretch with Strap  - 1 x daily - 7 x weekly - 1 sets - 3 reps - 30 sec  hold - Standing Diagonal Chop  - 1 x daily - 7 x weekly - 3 sets - 10 reps - Reverse Chop  with Resistance  - 1 x daily - 7 x weekly - 3 sets - 10 reps - Standing ITB Stretch  - 1 x daily - 7 x weekly - 3 reps - 30 sec hold - Quadruped Hip Abduction and External Rotation  - 1 x daily - 7 x weekly - 2 sets - 10 reps - Beginner Front Arm Support  - 1 x daily - 7 x weekly - 2 sets - 10 reps - Supine Bridge  - 1 x daily - 7 x weekly - 2 sets - 10 reps - Single Leg Balance with Clock Reach  - 1 x daily - 7 x weekly - 2 sets - 10 reps - Retro Step  - 1 x daily - 7 x weekly - 2 sets - 10 reps - Supine Bridge with Resistance Band  - 1 x daily - 7 x weekly - 2-3 sets - 10 reps - Supine Active Straight Leg Raise  - 1 x daily - 7 x weekly - 2-3 sets - 10 reps - Prone Hip Extension with Ankle Weight  - 1 x daily - 7 x weekly - 2-3 sets - 10 reps - Clamshell with Resistance  - 1 x daily - 7 x weekly - 2-3 sets - 10 reps - Single-Leg [place] Deadlift With Dumbbell  - 1 x daily - 3 x weekly - 2 sets - 10 reps  ASSESSMENT:  CLINICAL IMPRESSION: Duran reports very high levels of pain in L hip today and expresses frustration from lack of guidance by PA in what can be done for his hip but has not yet followed up with ortho MD.  He states he has still been trying to "break in" his new boots, alternating wearing these with his old boots - he is uncertain if this is contributing to the increased hip pain.  Treatment focus on reducing pain with MT incorporating DN, joint mobilization and gentle stretching and ROM with pain down to 5/10 by end of session.  Zebulin will benefit from skilled PT to address above deficits to improve mobility and activity tolerance with decreased pain interference.   OBJECTIVE IMPAIRMENTS: decreased activity tolerance, decreased ROM, increased fascial restrictions, increased muscle spasms, impaired flexibility, and pain.   ACTIVITY LIMITATIONS: carrying, lifting, and bending  PARTICIPATION LIMITATIONS: driving, community activity, and occupation  PERSONAL FACTORS: Time  since onset of injury/illness/exacerbation and 1 comorbidity: history R THA, gout  are also affecting patient's functional outcome.   REHAB POTENTIAL: Excellent  CLINICAL DECISION MAKING: Stable/uncomplicated  EVALUATION COMPLEXITY: Low   GOALS: Goals reviewed with patient? Yes  SHORT TERM GOALS: Target date: 06/07/2023   Patient will be independent with initial HEP.  Baseline: given Goal status: MET  2.   Patient will report centralization of radicular symptoms.  Baseline: radiates down LLE to above L ankle Goal status: MET -  08/27/23 - no  recent LBP but ongoing L hip pain, most likely due to AVN of hip  LONG TERM GOALS: Target date:  extended to 09/24/23 Patient will be independent with advanced/ongoing HEP to improve outcomes and carryover.  Baseline:  Goal status: IN PROGRESS - 07/09/23 - Pt reports no concerns with current HEP; updated today  2.  Patient will report 75% improvement in low back pain to improve QOL.  Baseline: 7-8/10 L side Goal status: IN PROGRESS - 07/09/23 - Pt reports 65-70% improvement   3.  Patient will demonstrate full pain free lumbar ROM to perform ADLs.   Baseline: see objective Goal status: IN PROGRESS - 08/13/23 - 3-4/10 LBP with lumbar AROM  4.  Patient will be able to swing golf club without increased LBP to return to recreation/exercise.  Baseline: increased pain Goal status: IN PROGRESS - 08/13/23 - driving Saturday sore afterwards.   5.  Patient will report at least 6 points improvement on modified Oswestry to demonstrate improved functional ability.  Baseline: 13/50 Goal status: MET - 08/13/23 - 7/50  PLAN:  PT FREQUENCY: 1x/week due to work  PT DURATION: 8 weeks  PLANNED INTERVENTIONS: 97110-Therapeutic exercises, 97530- Therapeutic activity, 97112- Neuromuscular re-education, 97535- Self Care, 16109- Manual therapy, 97014- Electrical stimulation (unattended), 97035- Ultrasound, 60454- Traction (mechanical), Patient/Family education,  Balance training, Stair training, Taping, Dry Needling, Joint mobilization, Joint manipulation, Spinal manipulation, Spinal mobilization, Cryotherapy, and Moist heat.  PLAN FOR NEXT SESSION: continue to progress hip mobility, manual therapy TrDN to L QL, glutes, modalities PRN.   Marry Guan, PT 08/27/2023, 11:38 AM

## 2023-09-03 ENCOUNTER — Ambulatory Visit: Payer: Self-pay | Attending: Orthopaedic Surgery | Admitting: Physical Therapy

## 2023-09-03 DIAGNOSIS — M5459 Other low back pain: Secondary | ICD-10-CM | POA: Diagnosis present

## 2023-09-03 DIAGNOSIS — R252 Cramp and spasm: Secondary | ICD-10-CM | POA: Insufficient documentation

## 2023-09-03 NOTE — Therapy (Signed)
 OUTPATIENT PHYSICAL THERAPY TREATMENT    Patient Name: Terry Burton MRN: [id number] DOB:02/10/1961, 63 y.o., male Today's Date: 09/03/2023  END OF SESSION:  PT End of Session - 09/03/23 0946     Visit Number 11    Date for PT Re-Evaluation 09/24/23    Authorization Type BCBS    PT Start Time [phone]    PT Stop Time 1020    PT Time Calculation (min) 42 min    Activity Tolerance Patient tolerated treatment well    Behavior During Therapy WFL for tasks assessed/performed                 Past Medical History:  Diagnosis Date   Arthritis    right hip and back with pain down right leg   Gout    Past Surgical History:  Procedure Laterality Date   FRACTURE SURGERY     repair right elbow years ago- no retained hardware   TOTAL HIP ARTHROPLASTY  05/01/2011   Procedure: TOTAL HIP ARTHROPLASTY ANTERIOR APPROACH;  Surgeon: Kathryne Hitch;  Location: [facility];  Service: Orthopedics;  Laterality: Right;   Patient Active Problem List   Diagnosis Date Noted   Baker's cyst of knee, right 10/01/2020   Acute idiopathic gout of left knee 10/01/2020   Pseudogout of right knee 10/01/2020   Chronic pain of right knee 01/26/2017   Acute pain of right shoulder 12/10/2016   Right hip pain 12/10/2016   Hip arthritis 05/01/2011    PCP: Valinda Hoar, PA-C   REFERRING PROVIDER: Kathryne Hitch, MD   REFERRING DIAG: [phone] (ICD-10-CM) - Acute left-sided low back pain with left-sided sciatica   Rationale for Evaluation and Treatment: Rehabilitation  THERAPY DIAG:  Other low back pain  Cramp and spasm  ONSET DATE: 03/29/2023  NEXT MD VISIT:  needs to be scheduled   SUBJECTIVE:                                                                                                                                                                                           SUBJECTIVE STATEMENT: Sore in L lower back and L lateral hip. Yesterday at work, he stepped off of  lift gate incorrectly that wasn't lowered to the ground and ended up stretching the muscles too much. That misstep triggered the pain in his back and hip. TPDN has helped and was able to play golf for about 9 nine holes and would like to try DN again.  PAIN:  Are you having pain? Yes: NPRS scale: 5/10 Pain location:  L hip/thigh Pain description: comes and goes, stabbing  Aggravating factors: twitsting,  certain movements, especially moving hip, golf Relieving factors: relaxing, tylenol  PERTINENT HISTORY:  From MD notes " He was involved in an accident where a dock that he was fishing off of collapsed recently.  He was eventually seen at [facility] and had x-rays of the lumbar spine and his pelvis and left hip on October 28 of this year.  He still complains of oblique pain to the left side and lower back pain the left side.  He denies any groin pain.  He does have some radicular symptoms going down his left leg.    He denies any change in bowel or bladder function or weakness in his legs."  PMH: R THA   PRECAUTIONS: None  RED FLAGS: None   WEIGHT BEARING RESTRICTIONS: No  FALLS:  Has patient fallen in last 6 months? No  LIVING ENVIRONMENT: Lives with: lives with their family Lives in: House/apartment Stairs: Yes: Internal: 14 steps; on left going up Has following equipment at home: None  OCCUPATION: drive for [employer]  PLOF: Independent and Leisure: golf  PATIENT GOALS: get rid of pain   OBJECTIVE:   DIAGNOSTIC FINDINGS:  07/25/2023 MRI  L HIP IMPRESSION: 1. Avascular necrosis of the left femoral head with small subchondral fracture at the femoral head apex. Preservation of the femoral head contour without flattening or collapse. 2. Moderate osteoarthritis of the left hip. 3. Superior labral tear.  DG L Hip  and lumbar spine 03/29/23  FINDINGS: Disc height loss and endplate osteophytosis at L5-S1. The remainder of the disc spaces are maintained. No static  listhesis. No spondylolysis.   Prior right total hip arthroplasty in stable alignment. There is no evidence of hip fracture or dislocation. The left femoral head is seated within the acetabulum. Degenerative changes of the left hip with joint space narrowing. The sacroiliac joints and pubic symphysis are anatomically aligned.   PATIENT SURVEYS:  Modified Oswestry 13/50   COGNITION: Overall cognitive status: Within functional limits for tasks assessed     SENSATION: WFL  MUSCLE LENGTH: Hamstrings: Right 90 deg; Left 90 deg mild tightness bil   POSTURE: No Significant postural limitations  PALPATION: Tenderness L QL, L glute medius, L greater trochanter  LUMBAR ROM:   AROM eval 07/30/23 08/13/23  Flexion To ankles, p! left To ankles- sore To ankles  Extension 75% limited, stiff 65%- sore WNL, pain L side  Right lateral flexion WNL  WNL  Left lateral flexion WNL, p!  WNL, small pain on L  Right rotation WNL, p!  WNL, small pain on L  Left rotation WNL  WNL   (Blank rows = not tested)  LOWER EXTREMITY ROM:      Tightness bilateral hips, history of R TKA, limited hip IR bilateally  LOWER EXTREMITY MMT:    MMT Right eval Left eval  Hip flexion 5 4p!  Hip extension    Hip abduction 5 5  Hip adduction 5 5  Knee flexion 5 5  Knee extension 5 5  Ankle dorsiflexion 5 5  Ankle plantarflexion     (Blank rows = not tested)  LUMBAR SPECIAL TESTS:  Straight leg raise test: Negative and Long sit test: Positive Negative retest after MET  FUNCTIONAL TESTS:  5 times sit to stand: 10.5 seconds, no UE assist, table height 20" ( to put hips/knees at 90/90)  GAIT: Distance walked: 52' Assistive device utilized: None Level of assistance: Complete Independence Comments: no deviation  TODAY'S TREATMENT:  DATE:   09/03/23 THERAPEUTIC EXERCISE: To improve  strength, endurance, ROM, and flexibility.  Demonstration, verbal and tactile cues throughout for technique. Bike L2 x Modified Thomas L hip flexor stretch x 30" Gastroc stretch against wall x 30" B Supine glute bridge x 5 Supine glute bridge w/ ABD + RTB x 5  MANUAL THERAPY: To promote normalized muscle tension, improved flexibility, improved joint mobility, increased ROM, and reduced pain. Trigger Point Dry Needling: Treatment instructions/education: Subsequent Treatment: Instructions provided previously at initial dry needling treatment.  Education Handout Provided: Yes Consent: Patient Verbal Consent Given: Yes Treatment: Muscles Treated:  L Lateral head of gastroc, L glute max + med, L QL, L illiacus, L TFL Skilled palpation and monitoring of soft tissue during DN Electrical Stimulation Performed: No Treatment Response/Outcome: Twitch response elicited, Decreased tissue resistance noted, Decreased TTP, and Improved exercise tolerance STM/DTM, manual TPR and pin & stretch to muscles addressed with DN   08/27/23 THERAPEUTIC EXERCISE: To improve strength, endurance, ROM, and flexibility.  Demonstration, verbal and tactile cues throughout for technique.  TM 2.6 mph x 6 min Hooklying L KTOS piriformis stretch x 30" Mod thomas L hip flexor stretch x 30" Standing L hip openers x 10  MANUAL THERAPY: To promote normalized muscle tension, improved flexibility, improved joint mobility, increased ROM, and reduced pain. Trigger Point Dry Needling: Treatment instructions/education: Subsequent Treatment: Instructions provided previously at initial dry needling treatment.  Education Handout Provided: Previously Provided Consent: Patient Verbal Consent Given: Yes Treatment: Muscles Treated: L glute maximus and medius, L iliacus, L TFL Skilled palpation and monitoring of soft tissue during DN Electrical Stimulation Performed: No Treatment Response/Outcome: Twitch response elicited,  Palpable increase in muscle length, Decreased tissue resistance noted, Decreased TTP, and Improved exercise tolerance STM/DTM, manual TPR and pin & stretch to muscles addressed with DN Grade I-II L hip inf, A/P and distraction mobs for pain relief    08/13/23 - Recert Therapeutic Exercise: to improve strength and mobility.  Demo, verbal and tactile cues throughout for technique. Bike L2 x 6 min  Bridges x 10 Bridges with ball squeeze x 10 SLR 2 x 10  Hip abduction 2 x 10 - difficult today Therapeutic Activity:  review of goals Mod Oswestry 7/50 Lumbar AROM Review of Imaging Discussion of shoes  POC Manual Therapy: to decrease muscle spasm and pain and improve mobility STM/TPR to lumbar paraspinals, L QL, L UPA mobs lumbar spine, skilled palpation and monitoring during dry needling. Trigger Point Dry Needling  Subsequent Treatment: Instructions provided previously at initial dry needling treatment.  Instructions reviewed, if requested by the patient, prior to subsequent dry needling treatment.   Patient Verbal Consent Given: Yes Education Handout Provided: Previously Provided Muscles Treated: L QL, L lumbar multifidi L4/5 Electrical Stimulation Performed: No Treatment Response/Outcome: Twitch Response Elicited and Palpable Increase in Muscle Length  PATIENT EDUCATION:  Education details: HEP review, HEP update, and DN rational, procedure, outcomes, potential side effects, and recommended post-treatment exercises/activity  Person educated: Patient Education method: Explanation Education comprehension: verbalized understanding  HOME EXERCISE PROGRAM: Access Code: [email or web address] URL: [email or web address] Date: 09/03/2023 Prepared by: Niva Murren Joseph-Greene  Exercises - Theme park manager on Wall  - 2 x daily - 7 x weekly - 5 reps - 30 secc hold - Clamshell with Resistance  - 1 x daily - 7 x weekly - 2-3 sets - 10 reps - Standing Quadratus Lumborum Stretch with Doorway  - 1  x daily - 7 x weekly - 1 sets - 3  reps - 30 sec hold - Standing ITB Stretch  - 1 x daily - 7 x weekly - 3 reps - 30 sec hold - Modified Thomas Stretch  - 1 x daily - 7 x weekly - 1 sets - 3 reps - 30 sec hold - Supine Bridge with Resistance Band  - 1 x daily - 7 x weekly - 2-3 sets - 10 reps - Supine Lower Trunk Rotation  - 1 x daily - 7 x weekly - 1 sets - 10 reps - Supine Hip Internal and External Rotation  - 1 x daily - 7 x weekly - 1-2 sets - 10 reps - Standing Hip Flexor Stretch on Chair  - 1 x daily - 7 x weekly - 1 sets - 3 reps - 30 sec hold - Prone Quadriceps Stretch with Strap  - 1 x daily - 7 x weekly - 1 sets - 3 reps - 30 sec  hold - Standing Diagonal Chop  - 1 x daily - 7 x weekly - 3 sets - 10 reps - Reverse Chop with Resistance  - 1 x daily - 7 x weekly - 3 sets - 10 reps - Quadruped Hip Abduction and External Rotation  - 1 x daily - 7 x weekly - 2 sets - 10 reps - Beginner Front Arm Support  - 1 x daily - 7 x weekly - 2 sets - 10 reps - Supine Bridge  - 1 x daily - 7 x weekly - 2 sets - 10 reps - Single Leg Balance with Clock Reach  - 1 x daily - 7 x weekly - 2 sets - 10 reps - Retro Step  - 1 x daily - 7 x weekly - 2 sets - 10 reps - Supine Active Straight Leg Raise  - 1 x daily - 7 x weekly - 2-3 sets - 10 reps - Prone Hip Extension with Ankle Weight  - 1 x daily - 7 x weekly - 2-3 sets - 10 reps - Single-Leg [place] Deadlift With Dumbbell  - 1 x daily - 3 x weekly - 2 sets - 10 reps  ASSESSMENT:  CLINICAL IMPRESSION: Taivon reports that DN helped alleviate pain in his L hip and was able to play 9 holes of golf without any pain or issues over the weekend. However, his pain in his L hip and L lower back returned after getting off of a lift that wasn't properly lowered to the ground at work yesterday. Stated that he felt his muscles stretch too far and felt soreness and pain afterwards. He also reported burning sensation in his L lateral head of gastroc. Treatment mainly  focused on reducing pain with MT + DN, gentle stretching and reviewing current HEP to ensure he was performing exercises correctly. He tolerated the treatment session fairly well, noting decreased pain. Ember will benefit from continued PT to address deficits to improve mobility and activity tolerance with decreased pain interference.  OBJECTIVE IMPAIRMENTS: decreased activity tolerance, decreased ROM, increased fascial restrictions, increased muscle spasms, impaired flexibility, and pain.   ACTIVITY LIMITATIONS: carrying, lifting, and bending  PARTICIPATION LIMITATIONS: driving, community activity, and occupation  PERSONAL FACTORS: Time since onset of injury/illness/exacerbation and 1 comorbidity: history R THA, gout  are also affecting patient's functional outcome.   REHAB POTENTIAL: Excellent  CLINICAL DECISION MAKING: Stable/uncomplicated  EVALUATION COMPLEXITY: Low   GOALS: Goals reviewed with patient? Yes  SHORT TERM GOALS: Target date: 06/07/2023   Patient will be independent with initial  HEP.  Baseline: given Goal status: MET  2.   Patient will report centralization of radicular symptoms.  Baseline: radiates down LLE to above L ankle Goal status: MET -  08/27/23 - no recent LBP but ongoing L hip pain, most likely due to AVN of hip  LONG TERM GOALS: Target date:  extended to 09/24/23 Patient will be independent with advanced/ongoing HEP to improve outcomes and carryover.  Baseline:  Goal status: IN PROGRESS - 07/09/23 - Pt reports no concerns with current HEP; updated today  2.  Patient will report 75% improvement in low back pain to improve QOL.  Baseline: 7-8/10 L side Goal status: IN PROGRESS - 07/09/23 - Pt reports 65-70% improvement   3.  Patient will demonstrate full pain free lumbar ROM to perform ADLs.   Baseline: see objective Goal status: IN PROGRESS - 08/13/23 - 3-4/10 LBP with lumbar AROM  4.  Patient will be able to swing golf club without increased LBP to  return to recreation/exercise.  Baseline: increased pain Goal status: IN PROGRESS - 08/13/23 - driving Saturday sore afterwards.   5.  Patient will report at least 6 points improvement on modified Oswestry to demonstrate improved functional ability.  Baseline: 13/50 Goal status: MET - 08/13/23 - 7/50  PLAN:  PT FREQUENCY: 1x/week due to work  PT DURATION: 8 weeks  PLANNED INTERVENTIONS: 97110-Therapeutic exercises, 97530- Therapeutic activity, 97112- Neuromuscular re-education, 97535- Self Care, 30160- Manual therapy, 97014- Electrical stimulation (unattended), 97035- Ultrasound, 10932- Traction (mechanical), Patient/Family education, Balance training, Stair training, Taping, Dry Needling, Joint mobilization, Joint manipulation, Spinal manipulation, Spinal mobilization, Cryotherapy, and Moist heat.  PLAN FOR NEXT SESSION: continue to progress hip mobility, manual therapy TrDN to L QL, glutes, modalities PRN, review and consolidate current HEP   Holli Rengel Joseph-Greene, Student-PT 09/03/2023, 10:35 AM

## 2023-09-06 ENCOUNTER — Encounter: Payer: Self-pay | Admitting: Physical Therapy

## 2023-09-08 ENCOUNTER — Other Ambulatory Visit: Payer: Self-pay

## 2023-09-08 ENCOUNTER — Encounter (HOSPITAL_BASED_OUTPATIENT_CLINIC_OR_DEPARTMENT_OTHER): Payer: Self-pay

## 2023-09-08 ENCOUNTER — Emergency Department (HOSPITAL_BASED_OUTPATIENT_CLINIC_OR_DEPARTMENT_OTHER)
Admission: EM | Admit: 2023-09-08 | Discharge: 2023-09-08 | Disposition: A | Discharge status: Home / Self Care | Attending: Emergency Medicine | Admitting: Emergency Medicine

## 2023-09-08 ENCOUNTER — Emergency Department (HOSPITAL_BASED_OUTPATIENT_CLINIC_OR_DEPARTMENT_OTHER)

## 2023-09-08 DIAGNOSIS — M10041 Idiopathic gout, right hand: Secondary | ICD-10-CM | POA: Insufficient documentation

## 2023-09-08 DIAGNOSIS — M109 Gout, unspecified: Secondary | ICD-10-CM

## 2023-09-08 DIAGNOSIS — M79641 Pain in right hand: Secondary | ICD-10-CM | POA: Diagnosis present

## 2023-09-08 MED ORDER — OXYCODONE-ACETAMINOPHEN 5-325 MG PO TABS: 1.0000 | ORAL_TABLET | Freq: Four times a day (QID) | ORAL | 0 refills | Status: DC | PRN

## 2023-09-08 MED ORDER — COLCHICINE 0.6 MG PO TABS: ORAL_TABLET | ORAL | 0 refills | Status: DC

## 2023-09-08 MED ORDER — PREDNISONE 50 MG PO TABS
60.0000 mg | ORAL_TABLET | Freq: Once | ORAL | Status: AC
  Administered 2023-09-08: 60 mg via ORAL
  Filled 2023-09-08: qty 1

## 2023-09-08 MED ORDER — KETOROLAC TROMETHAMINE 60 MG/2ML IM SOLN
60.0000 mg | Freq: Once | INTRAMUSCULAR | Status: AC
Start: 2023-09-08 — End: 2023-09-08
  Administered 2023-09-08: 60 mg via INTRAMUSCULAR
  Filled 2023-09-08: qty 2

## 2023-09-08 MED ORDER — METHYLPREDNISOLONE 4 MG PO TBPK: ORAL_TABLET | ORAL | 0 refills | Status: DC

## 2023-09-08 NOTE — ED Provider Notes (Signed)
 [place] [facility] Provider Note   CSN: [id number] Arrival date & time: 09/08/23  4098     History  Chief Complaint  Patient presents with   Hand Pain    Terry Burton is a 63 y.o. male.  He has a history of arthritis and gout.  Complaining of a flareup of gout in his right hand has been going on for the last 5 days.  No trauma.  Rates the pain is severe and worse with any type of movement.  No fever.  Has had gout flareup in his hand before but not in years.  Tried some leftover prednisone without improvement.  The history is provided by the patient.  Hand Pain This is a recurrent problem. The current episode started more than 2 days ago. The problem occurs constantly. The problem has been gradually worsening. Pertinent negatives include no chest pain, no abdominal pain, no headaches and no shortness of breath. The symptoms are aggravated by bending and twisting. Nothing relieves the symptoms. Treatments tried: prednisone. The treatment provided no relief.       Home Medications Prior to Admission medications   Medication Sig Start Date End Date Taking? Authorizing Provider  ALLOPURINOL PO Take by mouth.    [provider]  amLODipine (NORVASC) 2.5 MG tablet Take 1 tablet by mouth daily. 03/11/20   [provider]  azithromycin (ZITHROMAX Z-PAK) 250 MG tablet Take 2 tablets PO on day one, then take one tablet daily for 4 days 08/16/21   Zadie Rhine, MD  benzonatate (TESSALON) 100 MG capsule Take 1 capsule (100 mg total) by mouth every 8 (eight) hours. 08/16/21   Zadie Rhine, MD  cyclobenzaprine (FLEXERIL) 10 MG tablet Take 1 tablet (10 mg total) by mouth 2 (two) times daily as needed for muscle spasms. 03/29/23   Virgina Norfolk, DO  geriatric multivitamins-minerals (ELDERTONIC/GEVRABON) ELIX Take 15 mLs by mouth daily.    [provider]  ibuprofen (ADVIL) 400 MG tablet Take 1 tablet (400 mg total) by mouth  every 6 (six) hours as needed. 08/16/23   Kirtland Bouchard, PA-C  lidocaine (LIDODERM) 5 % Place 1 patch onto the skin daily. Remove & Discard patch within 12 hours or as directed by MD 03/29/23   Virgina Norfolk, DO  methylPREDNISolone (MEDROL DOSEPAK) 4 MG TBPK tablet Follow package insert 03/29/23   Curatolo, Adam, DO      Allergies    Bee pollen    Review of Systems   Review of Systems  Respiratory:  Negative for shortness of breath.   Cardiovascular:  Negative for chest pain.  Gastrointestinal:  Negative for abdominal pain.  Neurological:  Negative for headaches.    Physical Exam Updated Vital Signs BP (!) 162/85   Pulse 82   Temp 98.2 F (36.8 C)   Resp 18   Ht 6' (1.829 m)   Wt 88.5 kg   SpO2 100%   BMI 26.45 kg/m  Physical Exam Vitals and nursing note reviewed.  Constitutional:      Appearance: Normal appearance. He is well-developed.  HENT:     Head: Normocephalic and atraumatic.  Eyes:     Conjunctiva/sclera: Conjunctivae normal.  Cardiovascular:     Rate and Rhythm: Normal rate and regular rhythm.  Pulmonary:     Effort: Pulmonary effort is normal.  Musculoskeletal:        General: Swelling and tenderness present.     Cervical back: Neck supple.  Comments: Right hand is markedly swollen on the dorsum.  He is tender over the 2nd and 3rd MCPs.  Extends down to the wrist.  No open wounds.  Limited range of motion secondary to pain and swelling.  Distal cap refill brisk.  Radial pulse 2+.  Skin:    General: Skin is warm and dry.  Neurological:     General: No focal deficit present.     Mental Status: He is alert.     GCS: GCS eye subscore is 4. GCS verbal subscore is 5. GCS motor subscore is 6.     ED Results / Procedures / Treatments   Labs (all labs ordered are listed, but only abnormal results are displayed) Labs Reviewed - No data to display  EKG None  Radiology DG Hand Complete Right Result Date: 09/08/2023 CLINICAL DATA:  Right hand painful  and swollen for 5 days. No known injury. History of gout. EXAM: RIGHT HAND - COMPLETE 3+ VIEW COMPARISON:  Right hand radiographs 05/05/2020 FINDINGS: 1.5 mm ulnar positive variance. Minimal distal lateral scaphoid degenerative spurring. Mild thumb metacarpophalangeal peripheral degenerative spurring. Mild fifth DIP joint space narrowing and peripheral osteophytosis. No acute fracture or dislocation. Moderate diffuse soft tissue swelling, greatest dorsal to the metacarpals and metacarpophalangeal joints. IMPRESSION: 1. Moderate diffuse soft tissue swelling, greatest dorsal to the metacarpals and metacarpophalangeal joints. 2. Mild thumb metacarpophalangeal and fifth DIP osteoarthritis. Electronically Signed   By: Neita Garnet M.D.   On: 09/08/2023 10:50    Procedures Procedures    Medications Ordered in ED Medications  ketorolac (TORADOL) injection 60 mg (has no administration in time range)  predniSONE (DELTASONE) tablet 60 mg (has no administration in time range)    ED Course/ Medical Decision Making/ A&P Clinical Course as of 09/08/23 1651  Wed Sep 08, 2023  0955 X-rays interpreted by me shows soft tissue swelling no acute fracture or dislocation.  Radiology reading. [MB]    Clinical Course User Index [MB] Terrilee Files, MD                                 Medical Decision Making Amount and/or Complexity of Data Reviewed Radiology: ordered.  Risk Prescription drug management.   This patient complains of right hand swelling and pain; this involves an extensive number of treatment Options and is a complaint that carries with it a high risk of complications and morbidity. The differential includes gout, arthritis, fracture, dislocation, foreign body  I ordered medication Toradol and prednisone and reviewed PMP when indicated. I ordered imaging studies which included x-ray right hand and I independently    visualized and interpreted imaging which showed no acute fracture or  dislocation Previous records obtained and reviewed in epic no recent ED visits Social determinants considered, no significant barriers Critical Interventions: None  After the interventions stated above, I reevaluated the patient and found patient to be feeling a little bit better Admission and further testing considered, no indications for admission or further workup at this time.  Will treat with colchicine and steroids.  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  Will also prescribe short course of pain medicine and recommended follow-up with PCP.  Return instructions discussed         Final Clinical Impression(s) / ED Diagnoses Final diagnoses:  Acute gout of right hand, unspecified cause    Rx / DC  Orders ED Discharge Orders          Ordered    methylPREDNISolone (MEDROL DOSEPAK) 4 MG TBPK tablet        09/08/23 0930    colchicine 0.6 MG tablet        09/08/23 0930    oxyCODONE-acetaminophen (PERCOCET/ROXICET) 5-325 MG tablet  Every 6 hours PRN        09/08/23 0930              Terrilee Files, MD 09/08/23 [phone]

## 2023-09-08 NOTE — ED Notes (Signed)

## 2023-09-08 NOTE — ED Notes (Signed)
 Pt has significant swelling and redness, with tenderness to touch to R hand and wrist. Advised it started yesterday without improvement today. Unable to bend fingers without immense pain.

## 2023-09-08 NOTE — ED Triage Notes (Signed)
 Pt states that his right hand has been painful since Friday. States that he is having a gout flare.

## 2023-09-10 ENCOUNTER — Encounter: Payer: Self-pay | Admitting: Physical Therapy

## 2023-09-10 ENCOUNTER — Ambulatory Visit: Payer: Self-pay | Admitting: Physical Therapy

## 2023-09-10 DIAGNOSIS — M5459 Other low back pain: Secondary | ICD-10-CM

## 2023-09-10 DIAGNOSIS — R252 Cramp and spasm: Secondary | ICD-10-CM

## 2023-09-10 NOTE — Therapy (Addendum)
 OUTPATIENT PHYSICAL THERAPY TREATMENT / DISCHARGE SUMMARY    Patient Name: Terry Burton MRN: [id number] DOB:Feb 17, 1961, 63 y.o., male Today's Date: 09/10/2023  END OF SESSION:  PT End of Session - 09/10/23 0940     Visit Number 12    Date for PT Re-Evaluation 09/24/23    Authorization Type BCBS    PT Start Time 0939    PT Stop Time 1018    PT Time Calculation (min) 39 min    Activity Tolerance Patient tolerated treatment well    Behavior During Therapy WFL for tasks assessed/performed                  Past Medical History:  Diagnosis Date   Arthritis    right hip and back with pain down right leg   Gout    Past Surgical History:  Procedure Laterality Date   FRACTURE SURGERY     repair right elbow years ago- no retained hardware   TOTAL HIP ARTHROPLASTY  05/01/2011   Procedure: TOTAL HIP ARTHROPLASTY ANTERIOR APPROACH;  Surgeon: [name];  Location: [facility];  Service: Orthopedics;  Laterality: Right;   Patient Active Problem List   Diagnosis Date Noted   Baker's cyst of knee, right 10/01/2020   Acute idiopathic gout of left knee 10/01/2020   Pseudogout of right knee 10/01/2020   Chronic pain of right knee 01/26/2017   Acute pain of right shoulder 12/10/2016   Right hip pain 12/10/2016   Hip arthritis 05/01/2011    PCP: Francella Heddy Coe, PA-C   REFERRING PROVIDER: Poli Lonni CINDERELLA, MD   REFERRING DIAG: [phone] (ICD-10-CM) - Acute left-sided low back pain with left-sided sciatica   Rationale for Evaluation and Treatment: Rehabilitation  THERAPY DIAG:  Other low back pain  Cramp and spasm  ONSET DATE: 03/29/2023  NEXT MD VISIT:  needs to be scheduled   SUBJECTIVE:                                                                                                                                                                                           SUBJECTIVE STATEMENT: Reports no pain in hip and back, but stiffness in both.  Haven't felt burning in L calf since DN session on last Friday. Didn't get to do exercises this week due to gout flare up in R hand that started on Friday.  PAIN:  Are you having pain? Yes: NPRS scale: 0/10 in hip & low back, but stiffness Pain location:  L hip/thigh Pain description: comes and goes, stabbing  Aggravating factors: twitsting, certain movements, especially moving hip, golf Relieving factors: relaxing, tylenol   PERTINENT HISTORY:  From MD notes  He was involved in an accident where a dock that he was fishing off of collapsed recently.  He was eventually seen at [facility] and had x-rays of the lumbar spine and his pelvis and left hip on October 28 of this year.  He still complains of oblique pain to the left side and lower back pain the left side.  He denies any groin pain.  He does have some radicular symptoms going down his left leg. He denies any change in bowel or bladder function or weakness in his legs.  PMH: R THA   PRECAUTIONS: None  RED FLAGS: None   WEIGHT BEARING RESTRICTIONS: No  FALLS:  Has patient fallen in last 6 months? No  LIVING ENVIRONMENT: Lives with: lives with their family Lives in: House/apartment Stairs: Yes: Internal: 14 steps; on left going up Has following equipment at home: None  OCCUPATION: drive for [employer]  PLOF: Independent and Leisure: golf  PATIENT GOALS: get rid of pain   OBJECTIVE:   DIAGNOSTIC FINDINGS:  07/25/2023 MRI  L HIP IMPRESSION: 1. Avascular necrosis of the left femoral head with small subchondral fracture at the femoral head apex. Preservation of the femoral head contour without flattening or collapse. 2. Moderate osteoarthritis of the left hip. 3. Superior labral tear.  DG L Hip  and lumbar spine 03/29/23  FINDINGS: Disc height loss and endplate osteophytosis at L5-S1. The remainder of the disc spaces are maintained. No static listhesis. No spondylolysis.   Prior right total hip arthroplasty  in stable alignment. There is no evidence of hip fracture or dislocation. The left femoral head is seated within the acetabulum. Degenerative changes of the left hip with joint space narrowing. The sacroiliac joints and pubic symphysis are anatomically aligned.   PATIENT SURVEYS:  Modified Oswestry 13/50   COGNITION: Overall cognitive status: Within functional limits for tasks assessed     SENSATION: WFL  MUSCLE LENGTH: Hamstrings: Right 90 deg; Left 90 deg mild tightness bil   POSTURE: No Significant postural limitations  PALPATION: Tenderness L QL, L glute medius, L greater trochanter  LUMBAR ROM:   AROM eval 07/30/23 08/13/23  Flexion To ankles, p! left To ankles- sore To ankles  Extension 75% limited, stiff 65%- sore WNL, pain L side  Right lateral flexion WNL  WNL  Left lateral flexion WNL, p!  WNL, small pain on L  Right rotation WNL, p!  WNL, small pain on L  Left rotation WNL  WNL   (Blank rows = not tested)  LOWER EXTREMITY ROM:      Tightness bilateral hips, history of R TKA, limited hip IR bilateally  LOWER EXTREMITY MMT:    MMT Right eval Left eval  Hip flexion 5 4p!  Hip extension    Hip abduction 5 5  Hip adduction 5 5  Knee flexion 5 5  Knee extension 5 5  Ankle dorsiflexion 5 5  Ankle plantarflexion     (Blank rows = not tested)  LUMBAR SPECIAL TESTS:  Straight leg raise test: Negative and Long sit test: Positive Negative retest after MET  FUNCTIONAL TESTS:  5 times sit to stand: 10.5 seconds, no UE assist, table height 20 ( to put hips/knees at 90/90)  GAIT: Distance walked: 46' Assistive device utilized: None Level of assistance: Complete Independence Comments: no deviation  TODAY'S TREATMENT:  DATE:   09/10/23 THERAPEUTIC EXERCISE: To improve strength, endurance, ROM, and flexibility.  Demonstration, verbal and  tactile cues throughout for technique. Bike L2 x 6 min Standing hip flexor stretch x 30 B Standing hip flexor stretch with foot on chair (mat table) x 30 B Standing quadratus lumborum stretch w/ doorway x 30  NEUROMUSCULAR RE-EDUCATION: To improve balance, coordination, kinesthesia, and proprioception. Standing 4 way w/ RTB around ankle 2 x 10 B  MANUAL THERAPY: To promote normalized muscle tension and improved flexibility. Trigger Point Dry Needling: Treatment instructions/education: Subsequent Treatment: Instructions provided previously at initial dry needling treatment.  Education Handout Provided: Previously Provided Consent: Patient Verbal Consent Given: Yes Treatment: Muscles Treated: L erector spinae, L QL Skilled palpation and monitoring of soft tissue during DN Electrical Stimulation Performed: No Treatment Response/Outcome: Twitch response elicited, Decreased tissue resistance noted, and Decreased TTP STM/DTM, manual TPR and pin & stretch to muscles addressed with DN  SELF CARE: Review current HEP + consolidate   09/03/23 THERAPEUTIC EXERCISE: To improve strength, endurance, ROM, and flexibility.  Demonstration, verbal and tactile cues throughout for technique. Bike L2 x Modified Thomas L hip flexor stretch x 30 Gastroc stretch against wall x 30 B Supine glute bridge x 5 Supine glute bridge w/ ABD + RTB x 5  MANUAL THERAPY: To promote normalized muscle tension, improved flexibility, improved joint mobility, increased ROM, and reduced pain. Trigger Point Dry Needling: Treatment instructions/education: Subsequent Treatment: Instructions provided previously at initial dry needling treatment.  Education Handout Provided: Yes Consent: Patient Verbal Consent Given: Yes Treatment: Muscles Treated:  L Lateral head of gastroc, L glute max + med, L QL, L illiacus, L TFL Skilled palpation and monitoring of soft tissue during DN Electrical Stimulation Performed:  No Treatment Response/Outcome: Twitch response elicited, Decreased tissue resistance noted, Decreased TTP, and Improved exercise tolerance STM/DTM, manual TPR and pin & stretch to muscles addressed with DN   08/27/23 THERAPEUTIC EXERCISE: To improve strength, endurance, ROM, and flexibility.  Demonstration, verbal and tactile cues throughout for technique.  TM 2.6 mph x 6 min Hooklying L KTOS piriformis stretch x 30 Mod thomas L hip flexor stretch x 30 Standing L hip openers x 10  MANUAL THERAPY: To promote normalized muscle tension, improved flexibility, improved joint mobility, increased ROM, and reduced pain. Trigger Point Dry Needling: Treatment instructions/education: Subsequent Treatment: Instructions provided previously at initial dry needling treatment.  Education Handout Provided: Previously Provided Consent: Patient Verbal Consent Given: Yes Treatment: Muscles Treated: L glute maximus and medius, L iliacus, L TFL Skilled palpation and monitoring of soft tissue during DN Electrical Stimulation Performed: No Treatment Response/Outcome: Twitch response elicited, Palpable increase in muscle length, Decreased tissue resistance noted, Decreased TTP, and Improved exercise tolerance STM/DTM, manual TPR and pin & stretch to muscles addressed with DN Grade I-II L hip inf, A/P and distraction mobs for pain relief   PATIENT EDUCATION:  Education details: HEP review, HEP update, and DN rational, procedure, outcomes, potential side effects, and recommended post-treatment exercises/activity  Person educated: Patient Education method: Explanation Education comprehension: verbalized understanding  HOME EXERCISE PROGRAM: Access Code: [email or web address] URL: [email or web address] Date: 09/10/2023 Prepared by: Shontelle Muska Joseph-Greene  Exercises - Gastroc Stretch on Wall  - 2 x daily - 7 x weekly - 5 reps - 30 secc hold - Clamshell with Resistance  - 1 x daily - 7 x weekly - 2-3 sets -  10 reps - Standing Quadratus Lumborum Stretch with Doorway  - 1 x daily - 7 x  weekly - 1 sets - 3 reps - 30 sec hold - Standing ITB Stretch  - 1 x daily - 7 x weekly - 3 reps - 30 sec hold - Modified Thomas Stretch  - 1 x daily - 7 x weekly - 1 sets - 3 reps - 30 sec hold - Supine Bridge with Resistance Band  - 1 x daily - 7 x weekly - 2-3 sets - 10 reps - Supine Lower Trunk Rotation  - 1 x daily - 7 x weekly - 1 sets - 10 reps - Supine Hip Internal and External Rotation  - 1 x daily - 7 x weekly - 1-2 sets - 10 reps - Standing Hip Flexor Stretch on Chair  - 1 x daily - 7 x weekly - 1 sets - 3 reps - 30 sec hold - Prone Quadriceps Stretch with Strap  - 1 x daily - 7 x weekly - 1 sets - 3 reps - 30 sec  hold - Standing Diagonal Chop  - 1 x daily - 7 x weekly - 3 sets - 10 reps - Reverse Chop with Resistance  - 1 x daily - 7 x weekly - 3 sets - 10 reps - Quadruped Hip Abduction and External Rotation  - 1 x daily - 7 x weekly - 2 sets - 10 reps - Beginner Front Arm Support  - 1 x daily - 7 x weekly - 2 sets - 10 reps - Supine Bridge  - 1 x daily - 7 x weekly - 2 sets - 10 reps - Single Leg Balance with Clock Reach  - 1 x daily - 7 x weekly - 2 sets - 10 reps - Retro Step  - 1 x daily - 7 x weekly - 2 sets - 10 reps - Supine Active Straight Leg Raise  - 1 x daily - 7 x weekly - 2-3 sets - 10 reps - Single-Leg [place] Deadlift With Dumbbell  - 1 x daily - 3 x weekly - 2 sets - 10 reps - Standing Hip Adduction with Anchored Resistance  - 1 x daily - 5-7 x weekly - 2 sets - 10 reps - Standing Hip Abduction with Anchored Resistance  - 1 x daily - 5-7 x weekly - 2 sets - 10 reps - Standing Hip Extension with Anchored Resistance  - 1 x daily - 5-7 x weekly - 2 sets - 10 reps - Standing Hip Flexion with Anchored Resistance  - 1 x daily - 5-7 x weekly - 2 sets - 10 reps   ASSESSMENT:  CLINICAL IMPRESSION: Souleymane returned stating that he had no pain in L hip or L low back after DN last Friday, but  has some stiffness, more so in low back. Unable to perform HEP this week due to pain from gout flare up in R hand that interfered with his ability to perform exercises and some ADLs. Therefore, we began the session reviewing and performing stretches to help alleviate stiffness in low back and hips. He likes doing the standing hip flexion stretches because it helps stretch the front part of hips but also challenges his balance which he said he would like to work on. Incorporated the 4 way hip strengthening exercise to improve hip mobility and balance. Ended the session with DN to help decrease stiffness in low back and he tolerated the treatment session well. Nasiah will continue to benefit from skilled PT to address ongoing deficits to improve hip and back mobility with  decreased stiffness and pain interference.   OBJECTIVE IMPAIRMENTS: decreased activity tolerance, decreased ROM, increased fascial restrictions, increased muscle spasms, impaired flexibility, and pain.   ACTIVITY LIMITATIONS: carrying, lifting, and bending  PARTICIPATION LIMITATIONS: driving, community activity, and occupation  PERSONAL FACTORS: Time since onset of injury/illness/exacerbation and 1 comorbidity: history R THA, gout are also affecting patient's functional outcome.   REHAB POTENTIAL: Excellent  CLINICAL DECISION MAKING: Stable/uncomplicated  EVALUATION COMPLEXITY: Low   GOALS: Goals reviewed with patient? Yes  SHORT TERM GOALS: Target date: 06/07/2023   Patient will be independent with initial HEP.  Baseline: given Goal status: MET  2.   Patient will report centralization of radicular symptoms.  Baseline: radiates down LLE to above L ankle Goal status: MET -  08/27/23 - no recent LBP but ongoing L hip pain, most likely due to AVN of hip  LONG TERM GOALS: Target date:  extended to 09/24/23 Patient will be independent with advanced/ongoing HEP to improve outcomes and carryover.  Baseline:  Goal status: IN  PROGRESS - 07/09/23 - Pt reports no concerns with current HEP  2.  Patient will report 75% improvement in low back pain to improve QOL.  Baseline: 7-8/10 L side Goal status: MET - 09/10/23 Reports 70-80% improvement  3.  Patient will demonstrate full pain free lumbar ROM to perform ADLs.   Baseline: see objective Goal status: IN PROGRESS - 08/13/23 - 3-4/10 LBP with lumbar AROM  4.  Patient will be able to swing golf club without increased LBP to return to recreation/exercise.  Baseline: increased pain Goal status: IN PROGRESS - 08/13/23 - driving Saturday sore afterwards.   5.  Patient will report at least 6 points improvement on modified Oswestry to demonstrate improved functional ability.  Baseline: 13/50 Goal status: MET - 08/13/23 - 7/50  PLAN:  PT FREQUENCY: 1x/week due to work  PT DURATION: 8 weeks  PLANNED INTERVENTIONS: 97110-Therapeutic exercises, 97530- Therapeutic activity, 97112- Neuromuscular re-education, 97535- Self Care, 02859- Manual therapy, 97014- Electrical stimulation (unattended), 97035- Ultrasound, 02987- Traction (mechanical), Patient/Family education, Balance training, Stair training, Taping, Dry Needling, Joint mobilization, Joint manipulation, Spinal manipulation, Spinal mobilization, Cryotherapy, and Moist heat.  PLAN FOR NEXT SESSION: continue to progress hip mobility, incorporate balance exercises, manual therapy TrDN to L QL, glutes, modalities PRN, review and consolidate current HEP   Felisa Zechman Joseph-Greene, Student-PT 09/10/2023, 10:43 AM    PHYSICAL THERAPY DISCHARGE SUMMARY  Visits from Start of Care: 12  Current functional level related to goals / functional outcomes: Refer to above clinical impression and goal assessment for status as of last visit on 09/10/2023. Patient did not schedule any further visits and did not return to PT in >30 days, therefore will proceed with discharge from PT for this episode.     Remaining deficits: As above. Unable to  formally assess status at discharge due to failure to return to PT.   Education / Equipment: HEP   Patient agrees to discharge. Patient goals were partially met. Patient is being discharged due to not returning since the last visit.  Elijah EMERSON Hidden, PT 12/31/2023, 8:52 AM  [facility] [address]  [address] [place], [place], [address] Phone: [phone]   Fax:  [phone]

## 2023-09-16 ENCOUNTER — Other Ambulatory Visit: Payer: Self-pay

## 2023-09-16 ENCOUNTER — Emergency Department (HOSPITAL_BASED_OUTPATIENT_CLINIC_OR_DEPARTMENT_OTHER)
Admission: EM | Admit: 2023-09-16 | Discharge: 2023-09-16 | Disposition: A | Discharge status: Home / Self Care | Attending: Emergency Medicine | Admitting: Emergency Medicine

## 2023-09-16 ENCOUNTER — Encounter (HOSPITAL_BASED_OUTPATIENT_CLINIC_OR_DEPARTMENT_OTHER): Payer: Self-pay

## 2023-09-16 DIAGNOSIS — R2231 Localized swelling, mass and lump, right upper limb: Secondary | ICD-10-CM | POA: Diagnosis present

## 2023-09-16 DIAGNOSIS — D72829 Elevated white blood cell count, unspecified: Secondary | ICD-10-CM | POA: Insufficient documentation

## 2023-09-16 DIAGNOSIS — M10041 Idiopathic gout, right hand: Secondary | ICD-10-CM | POA: Insufficient documentation

## 2023-09-16 DIAGNOSIS — M109 Gout, unspecified: Secondary | ICD-10-CM

## 2023-09-16 LAB — CBC WITH DIFFERENTIAL/PLATELET
Abs Immature Granulocytes: 0.19 10*3/uL — ABNORMAL HIGH (ref 0.00–0.07)
Basophils Absolute: 0.1 10*3/uL (ref 0.0–0.1)
Basophils Relative: 1 %
Eosinophils Absolute: 0.1 10*3/uL (ref 0.0–0.5)
Eosinophils Relative: 1 %
HCT: 39.3 % (ref 39.0–52.0)
Hemoglobin: 13.3 g/dL (ref 13.0–17.0)
Immature Granulocytes: 2 %
Lymphocytes Relative: 17 %
Lymphs Abs: 2 10*3/uL (ref 0.7–4.0)
MCH: 30.4 pg (ref 26.0–34.0)
MCHC: 33.8 g/dL (ref 30.0–36.0)
MCV: 89.9 fL (ref 80.0–100.0)
Monocytes Absolute: 1 10*3/uL (ref 0.1–1.0)
Monocytes Relative: 9 %
Neutro Abs: 8.3 10*3/uL — ABNORMAL HIGH (ref 1.7–7.7)
Neutrophils Relative %: 70 %
Platelets: 222 10*3/uL (ref 150–400)
RBC: 4.37 MIL/uL (ref 4.22–5.81)
RDW: 13.4 % (ref 11.5–15.5)
WBC: 11.7 10*3/uL — ABNORMAL HIGH (ref 4.0–10.5)
nRBC: 0 % (ref 0.0–0.2)

## 2023-09-16 LAB — COMPREHENSIVE METABOLIC PANEL WITH GFR
ALT: 20 U/L (ref 0–44)
AST: 15 U/L (ref 15–41)
Albumin: 3.6 g/dL (ref 3.5–5.0)
Alkaline Phosphatase: 76 U/L (ref 38–126)
Anion gap: 8 (ref 5–15)
BUN: 12 mg/dL (ref 6–20)
CO2: 23 mmol/L (ref 22–32)
Calcium: 8.9 mg/dL (ref 8.9–10.3)
Chloride: 104 mmol/L (ref 98–111)
Creatinine, Ser: 0.85 mg/dL (ref 0.61–1.24)
GFR, Estimated: >60 mL/min (ref >60)
Glucose, Bld: 99 mg/dL (ref 70–99)
Potassium: 4.1 mmol/L (ref 3.5–5.1)
Sodium: 135 mmol/L (ref 135–145)
Total Bilirubin: 0.6 mg/dL (ref 0.0–1.2)
Total Protein: 7.1 g/dL (ref 6.5–8.1)

## 2023-09-16 MED ORDER — PREDNISONE 10 MG PO TABS: ORAL_TABLET | ORAL | 0 refills | Status: AC

## 2023-09-16 MED ORDER — KETOROLAC TROMETHAMINE 30 MG/ML IJ SOLN
30.0000 mg | Freq: Once | INTRAMUSCULAR | Status: AC
Start: 2023-09-16 — End: 2023-09-16
  Administered 2023-09-16: 30 mg via INTRAVENOUS

## 2023-09-16 MED ORDER — KETOROLAC TROMETHAMINE 30 MG/ML IJ SOLN
30.0000 mg | Freq: Once | INTRAMUSCULAR | Status: DC
  Filled 2023-09-16: qty 1

## 2023-09-16 NOTE — ED Provider Notes (Signed)
 [place] [facility] AT [facility] Provider Note   CSN: [id number] Arrival date & time: 09/16/23  1214     History Chief Complaint  Patient presents with   Joint Swelling    Terry Burton is a 63 y.o. male.  Patient presents the emergency department today with concerns of right hand swelling and pain.  Reports he has a history of gout and was seen about a week ago in the emergency department and started on colchicine  and a Medrol  Dosepak.  He reports that he was taking his medication as prescribed with improvement in symptoms.  States that he discontinued his steroid 2 days ago, symptoms began to rebound.  Denies any other recent injury or trauma to the area.  No recent fever, chills or bodyaches.  Denies any drainage or erythema in the swollen area of the right hand.  HPI     Home Medications Prior to Admission medications   Medication Sig Start Date End Date Taking? Authorizing Provider  colchicine  0.6 MG tablet Take 2 pills first dose and then 1 pill 1 hour later.  You can continue 1 pill daily for 7 to 10 days until improved. 09/08/23  Yes Tonya Fredrickson, MD  predniSONE  (DELTASONE ) 10 MG tablet Take 4 tablets (40 mg total) by mouth daily for 3 days, THEN 3 tablets (30 mg total) daily for 2 days, THEN 2 tablets (20 mg total) daily for 2 days, THEN 1 tablet (10 mg total) daily for 2 days. 09/16/23 09/25/23 Yes Daisja Kessinger A, PA-C  ALLOPURINOL PO Take by mouth.    [provider]  amLODipine (NORVASC) 2.5 MG tablet Take 1 tablet by mouth daily. 03/11/20   [provider]  azithromycin  (ZITHROMAX  Z-PAK) 250 MG tablet Take 2 tablets PO on day one, then take one tablet daily for 4 days 08/16/21   Eldon Greenland, MD  benzonatate  (TESSALON ) 100 MG capsule Take 1 capsule (100 mg total) by mouth every 8 (eight) hours. 08/16/21   Eldon Greenland, MD  cyclobenzaprine  (FLEXERIL ) 10 MG tablet Take 1 tablet (10 mg total) by mouth 2 (two) times daily as  needed for muscle spasms. 03/29/23   Lowery Rue, DO  geriatric multivitamins-minerals (ELDERTONIC/GEVRABON) ELIX Take 15 mLs by mouth daily.    [provider]  ibuprofen  (ADVIL ) 400 MG tablet Take 1 tablet (400 mg total) by mouth every 6 (six) hours as needed. 08/16/23   Bronson Canny, PA-C  lidocaine  (LIDODERM ) 5 % Place 1 patch onto the skin daily. Remove & Discard patch within 12 hours or as directed by MD 03/29/23   Lowery Rue, DO  methylPREDNISolone  (MEDROL  DOSEPAK) 4 MG TBPK tablet Follow package insert 09/08/23   Butler, Michael C, MD  oxyCODONE -acetaminophen  (PERCOCET/ROXICET) 5-325 MG tablet Take 1 tablet by mouth every 6 (six) hours as needed for severe pain (pain score 7-10). 09/08/23   Tonya Fredrickson, MD      Allergies    Bee pollen    Review of Systems   Review of Systems  Musculoskeletal:  Positive for joint swelling.  All other systems reviewed and are negative.   Physical Exam Updated Vital Signs BP (!) 149/94 (BP Location: Left Arm)   Pulse 77   Temp 98.6 F (37 C) (Oral)   Resp 16   Wt 88.9 kg   SpO2 96%   BMI 26.58 kg/m  Physical Exam Vitals and nursing note reviewed.  Constitutional:      General: He is not  in acute distress.    Appearance: He is well-developed.  HENT:     Head: Normocephalic and atraumatic.  Eyes:     Conjunctiva/sclera: Conjunctivae normal.  Cardiovascular:     Rate and Rhythm: Normal rate and regular rhythm.     Heart sounds: No murmur heard. Pulmonary:     Effort: Pulmonary effort is normal. No respiratory distress.     Breath sounds: Normal breath sounds.  Abdominal:     Palpations: Abdomen is soft.     Tenderness: There is no abdominal tenderness.  Musculoskeletal:        General: Swelling and tenderness present. No deformity or signs of injury.     Cervical back: Neck supple.     Comments: Swelling, erythema, tenderness overlying the dorsum of the right hand.  Area of max tenderness is over the third  digit.  There is some warmth to the area.  Skin:    General: Skin is warm and dry.     Capillary Refill: Capillary refill takes less than 2 seconds.  Neurological:     Mental Status: He is alert.  Psychiatric:        Mood and Affect: Mood normal.          ED Results / Procedures / Treatments   Labs (all labs ordered are listed, but only abnormal results are displayed) Labs Reviewed  CBC WITH DIFFERENTIAL/PLATELET - Abnormal; Notable for the following components:      Result Value   WBC 11.7 (*)    Neutro Abs 8.3 (*)    Abs Immature Granulocytes 0.19 (*)    All other components within normal limits  COMPREHENSIVE METABOLIC PANEL WITH GFR    EKG None  Radiology No results found.  Procedures Procedures    Medications Ordered in ED Medications  ketorolac  (TORADOL ) 30 MG/ML injection 30 mg (30 mg Intravenous Given 09/16/23 1358)    ED Course/ Medical Decision Making/ A&P                                 Medical Decision Making Amount and/or Complexity of Data Reviewed Labs: ordered.  Risk Prescription drug management.   This patient presents to the ED for concern of hand swelling.  Differential diagnosis includes gout, cellulitis, septic arthritis, hand injury   Lab Tests:  I Ordered, and personally interpreted labs.  The pertinent results include: CBC with leukocytosis at 11.7 but in the setting of recent steroid use, unremarkable, CMP unremarkable   Medicines ordered and prescription drug management:  I ordered medication including Toradol  for pain Reevaluation of the patient after these medicines showed that the patient improved I have reviewed the patients home medicines and have made adjustments as needed   Problem List / ED Course:  Patient presents to the emergency department today with concerns of right hand swelling.  Reports that he has a history of gout and was in the emergency department about 1 week ago and started on a combination of  colchicine  and 80 Medrol  Dosepak.  He states that he was on the steroid course, he had near resolution of his symptoms.  Finished the steroids about 2 days ago now symptoms have returned.  He denies any recent fever, chills, or bodyaches.  States that is a long history of gout typically affecting his knees.  Denies any recent IV drug use.  With a normal x-ray at the time of his visit about 10  days ago, will add on repeat labs for further assessment.  I anticipate there will be some mild leukocytosis given patient's recent steroid use.  If leukocytosis is minimal, we will plan on outpatient follow-up with the prednisone  taper. Physical exam is reassuring although there is notable swelling overlying the dorsum of the right hand.  Tenderness palpation primarily overlying the third digit.  No obvious erythema or induration of skin to suggest cellulitis. Basic labs are reassuring.  Mild leukocytosis at 11.7.  Will plan on discharging patient home with a course of prednisone  for the next 9 days.  A one-time dose of Toradol  was given here in the emergency department for pain control.  Patient otherwise stable and agreeable with plan for outpatient follow-up.  Discharged home in stable condition.  Final Clinical Impression(s) / ED Diagnoses Final diagnoses:  Acute gout of right hand, unspecified cause    Rx / DC Orders ED Discharge Orders          Ordered    predniSONE  (DELTASONE ) 10 MG tablet  Daily        09/16/23 1503              Jazmarie Biever A, PA-C 09/16/23 1534    Long, Joshua G, MD 09/17/23 [phone]

## 2023-09-16 NOTE — ED Triage Notes (Signed)
 Pt presents with complaint of recurrent right hand swelling and pain Seen last week and took prescribed medications. Swelling returned yesterday. Right hand noted to be swollen across top of hand and knuckles

## 2023-09-16 NOTE — Discharge Instructions (Addendum)
 You were seen in the ER today for concerns of hand pain.  Your labs were reassuring and with a normal hand xray from a few days ago, I suspect this is a recurrence of your gout. I have sent a new prescription for a prednisone taper to take for the next 9 days. Please take this as prescribed. Continue taking your colchicine to get this under control. Please follow up with your primary care provider for further assessment of these symptoms.

## 2023-10-10 ENCOUNTER — Other Ambulatory Visit: Payer: Self-pay

## 2023-10-10 ENCOUNTER — Emergency Department (HOSPITAL_BASED_OUTPATIENT_CLINIC_OR_DEPARTMENT_OTHER)
Admission: EM | Admit: 2023-10-10 | Discharge: 2023-10-10 | Disposition: A | Discharge status: Home / Self Care | Attending: Emergency Medicine | Admitting: Emergency Medicine

## 2023-10-10 ENCOUNTER — Encounter (HOSPITAL_BASED_OUTPATIENT_CLINIC_OR_DEPARTMENT_OTHER): Payer: Self-pay | Admitting: Emergency Medicine

## 2023-10-10 DIAGNOSIS — M109 Gout, unspecified: Secondary | ICD-10-CM | POA: Diagnosis not present

## 2023-10-10 DIAGNOSIS — M79641 Pain in right hand: Secondary | ICD-10-CM | POA: Diagnosis present

## 2023-10-10 MED ORDER — METHYLPREDNISOLONE 4 MG PO TBPK: ORAL_TABLET | ORAL | 0 refills | Status: DC

## 2023-10-10 MED ORDER — COLCHICINE 0.6 MG PO TABS
0.6000 mg | ORAL_TABLET | Freq: Every day | ORAL | 1 refills | Status: AC
Start: 2023-10-10 — End: ?

## 2023-10-10 MED ORDER — KETOROLAC TROMETHAMINE 15 MG/ML IJ SOLN
15.0000 mg | Freq: Once | INTRAMUSCULAR | Status: AC
  Administered 2023-10-10: 15 mg via INTRAMUSCULAR
  Filled 2023-10-10: qty 1

## 2023-10-10 MED ORDER — OXYCODONE-ACETAMINOPHEN 5-325 MG PO TABS: 1.0000 | ORAL_TABLET | Freq: Three times a day (TID) | ORAL | 0 refills | Status: DC | PRN

## 2023-10-10 NOTE — ED Notes (Signed)
 Writer noticed patient walking out of department. Writer inquired if patient was leaving. Pt stated "my insurance doesn't pay for me to be treated in a hall bed". Writer attempted to explain to patient that the hall beds and beds without monitors are utilized to see patient's that can be seen and treated quickly and that likely don't require extensive work ups. Patient stated he would wait in the lobby for a room. Writer spoke with Vearl Georgia, PA who instructed writer to tell patient that he is welcome to wait in the lobby for a room but that he would have a longer wait as a room is not available at this time. Pt then agreed to come back and be seen in the department. Charge nurse notified.

## 2023-10-10 NOTE — ED Triage Notes (Signed)
 Pt returns with RT hand pain and swelling; reports it has been this way for 1 month (seen here and PCP); currently taking colchicine 

## 2023-10-10 NOTE — Discharge Instructions (Addendum)
 Follow-up with your doctor.  You may need adjustment of the allopurinol or other medicines to help suppress the recurrences of gout.  Take the steroids and the pain medicines.  Continue to colchicine  for at least a month now.

## 2023-10-10 NOTE — ED Provider Notes (Signed)
 [place] [facility] Provider Note   CSN: [id number] Arrival date & time: 10/10/23  1239     History  Chief Complaint  Patient presents with   Hand Pain    Terry Burton is a 63 y.o. male.   Hand Pain  Patient with a current right hand pain and swelling.  History of gout.  Has been seen in the ER twice now and with PCP.  Has been on colchicine  and on steroids.  Has had pain medicines.  Recently finished up the colchicine  and hand started to swell again.  Worse over the 2nd and 3rd finger over the MCP joint.  No fevers.     Home Medications Prior to Admission medications   Medication Sig Start Date End Date Taking? Authorizing Provider  ALLOPURINOL PO Take by mouth.    [provider]  amLODipine (NORVASC) 2.5 MG tablet Take 1 tablet by mouth daily. 03/11/20   [provider]  benzonatate  (TESSALON ) 100 MG capsule Take 1 capsule (100 mg total) by mouth every 8 (eight) hours. 08/16/21   Eldon Greenland, MD  colchicine  0.6 MG tablet Take 1 tablet (0.6 mg total) by mouth daily. Take 2 pills first dose and then 1 pill 1 hour later.  You can continue 1 pill daily for 7 to 10 days until improved. 10/10/23   Mozell Arias, MD  cyclobenzaprine  (FLEXERIL ) 10 MG tablet Take 1 tablet (10 mg total) by mouth 2 (two) times daily as needed for muscle spasms. 03/29/23   Lowery Rue, DO  geriatric multivitamins-minerals (ELDERTONIC/GEVRABON) ELIX Take 15 mLs by mouth daily.    [provider]  ibuprofen  (ADVIL ) 400 MG tablet Take 1 tablet (400 mg total) by mouth every 6 (six) hours as needed. 08/16/23   Bronson Canny, PA-C  lidocaine  (LIDODERM ) 5 % Place 1 patch onto the skin daily. Remove & Discard patch within 12 hours or as directed by MD 03/29/23   Lowery Rue, DO  methylPREDNISolone  (MEDROL  DOSEPAK) 4 MG TBPK tablet Follow package insert 10/10/23   Mozell Arias, MD  oxyCODONE -acetaminophen  (PERCOCET/ROXICET) 5-325  MG tablet Take 1-2 tablets by mouth every 8 (eight) hours as needed for severe pain (pain score 7-10). 10/10/23   Mozell Arias, MD      Allergies    Bee pollen    Review of Systems   Review of Systems  Physical Exam Updated Vital Signs BP 132/80 (BP Location: Left Arm)   Pulse 80   Temp 98.6 F (37 C) (Oral)   Resp 16   Ht 6' (1.829 m)   Wt 88.9 kg   SpO2 97%   BMI 26.58 kg/m  Physical Exam Vitals and nursing note reviewed.  Cardiovascular:     Rate and Rhythm: Regular rhythm.  Pulmonary:     Breath sounds: No rhonchi.  Abdominal:     Tenderness: There is no abdominal tenderness.  Musculoskeletal:        General: Tenderness present.     Comments: Swelling to dorsum of right hand.  Tenderness particular over 2nd and 3rd MCP joint  Skin:    General: Skin is warm.  Neurological:     Mental Status: He is alert.     ED Results / Procedures / Treatments   Labs (all labs ordered are listed, but only abnormal results are displayed) Labs Reviewed - No data to display  EKG None  Radiology No results found.  Procedures Procedures    Medications Ordered in  ED Medications  ketorolac  (TORADOL ) 15 MG/ML injection 15 mg (15 mg Intramuscular Given 10/10/23 1406)    ED Course/ Medical Decision Making/ A&P                                 Medical Decision Making  Patient with pain and swelling in right hand.  History of same.  Has recently been on treatment for gout with steroids and colchicine  however once he finished up the pain and swelling returned.  I think this is likely recurrence of the gout.  Less likely other causes such as cellulitis.  Reviewed recent x-ray.  I think it is worth restarting the colchicine  and giving dose of steroids here.  Also will give some pain medicines for home.  Reviewed drug database.  Will extend the course of colchicine  longer to hopefully suppress this acute inflammation.  Appears stable for discharge to follow-up with  PCP.        Final Clinical Impression(s) / ED Diagnoses Final diagnoses:  Acute gout of right hand, unspecified cause    Rx / DC Orders ED Discharge Orders          Ordered    colchicine  0.6 MG tablet  Daily        10/10/23 1415    oxyCODONE -acetaminophen  (PERCOCET/ROXICET) 5-325 MG tablet  Every 8 hours PRN        10/10/23 1415    methylPREDNISolone  (MEDROL  DOSEPAK) 4 MG TBPK tablet        10/10/23 1415              Mozell Arias, MD 10/10/23 1421

## 2023-12-13 ENCOUNTER — Ambulatory Visit (INDEPENDENT_AMBULATORY_CARE_PROVIDER_SITE_OTHER): Admitting: Orthopaedic Surgery

## 2023-12-13 ENCOUNTER — Encounter: Payer: Self-pay | Admitting: Orthopaedic Surgery

## 2023-12-13 DIAGNOSIS — M87052 Idiopathic aseptic necrosis of left femur: Secondary | ICD-10-CM

## 2023-12-13 DIAGNOSIS — M25552 Pain in left hip: Secondary | ICD-10-CM | POA: Diagnosis not present

## 2023-12-13 DIAGNOSIS — M1612 Unilateral primary osteoarthritis, left hip: Secondary | ICD-10-CM

## 2023-12-13 MED ORDER — IBUPROFEN 800 MG PO TABS: 800.0000 mg | ORAL_TABLET | Freq: Three times a day (TID) | ORAL | 1 refills | Status: DC | PRN

## 2023-12-13 MED ORDER — CYCLOBENZAPRINE HCL 10 MG PO TABS: 10.0000 mg | ORAL_TABLET | Freq: Two times a day (BID) | ORAL | 1 refills | Status: DC | PRN

## 2023-12-13 NOTE — Progress Notes (Signed)
 The patient comes in with continued pain as a relates to his left hip arthritis with also AVN.  This really flared up after a dock that he was fishing on collapsed last year injuring that hip.  He just traveled recently [place]  for his 25th wedding anniversary and he had some more pain in that hip flareup as well as radicular symptoms going down his left leg and the side of his leg with some numbness and tingling.  He is interested in trying some physical therapy.  On exam his right hip moves smoothly and fluidly.  The left hip has some stiffness and pain with internal and external rotation.  He does have a positive straight leg raise on the left side and some subjective numbness down the lateral aspect of his left leg.  We will continue his ibuprofen  800 mg as well as some Flexeril .  He would like to try physical therapy on his back and his hip on the left side at [facility].  Will work on getting that scheduled with any modalities per therapist discretion and then we will see him back in a month to see how he is doing overall.

## 2023-12-14 ENCOUNTER — Other Ambulatory Visit: Payer: Self-pay

## 2023-12-14 DIAGNOSIS — M5442 Lumbago with sciatica, left side: Secondary | ICD-10-CM

## 2023-12-14 DIAGNOSIS — M87052 Idiopathic aseptic necrosis of left femur: Secondary | ICD-10-CM

## 2023-12-14 DIAGNOSIS — M1612 Unilateral primary osteoarthritis, left hip: Secondary | ICD-10-CM

## 2023-12-14 DIAGNOSIS — M25552 Pain in left hip: Secondary | ICD-10-CM

## 2023-12-23 NOTE — Progress Notes (Signed)
 [facility] [place] Office Note  Chief Complaint  Patient presents with  . Right Hand - Pain, Follow-up       Primary Historian: Patient  History of Present Illness: Terry Burton is a 63 year old male, with a past medical history of tobacco use, gout, HTN, anemia, hypercholesterolemia, and nephrolithiasis, who presents for follow-up of his right hand pain and swelling.  He is doing much better overall.  Most of the swelling has resolved, and he is not having much pain with range of motion of his hand.  He has no complaints at this time.  Review of Systems: No fevers or chills or sweats or nausea or vomiting. No headache or neck or back pain. All other systems reviewed today and are negative.   Vitals: BP 116/72   Pulse 60   Ht 1.854 m (6' 1)   Wt 89.9 kg (198 lb 3.2 oz)   BMI 26.15 kg/m   Physical Exam:  General: The patient is alert and attentive, pleasant and cooperative, well groomed, of stated age, in no acute distress, and alert and oriented x3.  Skin: Skin is in good condition throughout.  Musculoskeletal:  Right Hand: Very minimal swelling noted into the dorsum of the hand.  Nontender to palpation.  No surrounding erythema or warmth.  Has good active range of motion of the MCP and IP joints.  Able to make a tight fist.  Maintains good sensation to light touch.  Good color and capillary refill.   Cardiovascular/Neuro: 2+ radial pulses bilaterally. Patient is neurovascularly intact. Psych:  Good affect. Thought process normal.   Labs: I have reviewed the following external labs Uric acid from 11/19/2023 was 4.5 (Labs were reviewed today by myself to evaluate patient for appropriate medication management)    Assessment: Gout of the right hand, doing much better    Plan: He is doing much better overall.  Let him know that if he has another flareup, he will need to have another course of colchicine  or steroids, but for chronic  management, he should continue with his allopurinol.  His PCP should be able to manage this long-term, but if he has any concerns, he can follow-up as needed.  X-Ray:   Radiology Results (last 72 hours)     ** No results found for the last 72 hours. **        Allergies: Patient has no known allergies.  Medications:   Current Medications[1]  Past Medical History: Medical History[2]  Past Surgical History: Surgical History[3]  Family History: Family History[4]  Social History: Social History   Socioeconomic History  . Marital status: Married    Spouse name: Not on file  . Number of children: Not on file  . Years of education: Not on file  . Highest education level: Not on file  Occupational History  . Not on file  Tobacco Use  . Smoking status: Former    Types: Cigarettes    Passive exposure: Past  . Smokeless tobacco: Never  . Tobacco comments:    smokes cigars occ  Substance and Sexual Activity  . Alcohol use: Yes    Alcohol/week: 4.0 standard drinks of alcohol    Comment: 1 beer per day  . Drug use: No  . Sexual activity: Not on file  Other Topics Concern  . Not on file  Social History Narrative   ** Merged History Encounter **       Social Drivers of Health  Food Insecurity: Low Risk  (08/09/2023)   Food vital sign   . Within the past 12 months, you worried that your food would run out before you got money to buy more: Never true   . Within the past 12 months, the food you bought just didn't last and you didn't have money to get more: Never true  Transportation Needs: No Transportation Needs (08/09/2023)   Transportation   . In the past 12 months, has lack of reliable transportation kept you from medical appointments, meetings, work or from getting things needed for daily living? : No  Safety: Low Risk  (08/09/2023)   Safety   . How often does anyone, including family and friends, physically hurt you?: Never   . How often does anyone, including  family and friends, insult or talk down to you?: Never   . How often does anyone, including family and friends, threaten you with harm?: Never   . How often does anyone, including family and friends, scream or curse at you?: Never  Living Situation: Low Risk  (08/09/2023)   Living Situation   . What is your living situation today?: Not on file   . Think about the place you live. Do you have problems with any of the following? Choose all that apply:: None/None on this list    Test Results: No results found for this or any previous visit. No results found for this or any previous visit (from the past 48 hours).  Problem List: Problem List[5]      Terry Fischer, PA-C       [1] Current Outpatient Medications  Medication Sig Dispense Refill  . allopurinoL (ZYLOPRIM) 300 mg tablet Take 1 tablet (300 mg total) by mouth daily. 90 tablet 3  . amLODIPine (NORVASC) 2.5 mg tablet Take 1 tablet (2.5 mg total) by mouth daily. For blood pressure 90 tablet 3  . amoxicillin (AMOXIL) 500 mg tablet TAKE 4 TABS 1 HOUR PRIOR TO DENTAL APPOINTMENT    . colchicine  0.6 mg tablet TAKE 1 TABLET (0.6 MG TOTAL) BY MOUTH 2 (TWO) TIMES A DAY FOR 1 DAY, THEN 1 TABLET (0.6 MG TOTAL) DAILY. 32 tablet 0  . indomethacin  (INDOCIN ) 50 mg capsule Take 1 capsule (50 mg total) by mouth in the morning and 1 capsule (50 mg total) in the evening. Take with meals. 180 capsule 0  . multivit-min/ferrous fumarate (MULTI VITAMIN ORAL) Take by mouth.     No current facility-administered medications for this visit.  [2] Past Medical History: Diagnosis Date  . Arthritis   . Chronic pain of right knee 01/26/2017  . Gout   . H/O colonoscopy 06/27/2013  . Hip arthritis 05/01/2011  . Hypertension   . Impaired fasting glucose 07/16/2020  [3] Past Surgical History: Procedure Laterality Date  . ELBOW SURGERY Right    Procedure: ELBOW SURGERY  . TOTAL HIP ARTHROPLASTY Right    Procedure: TOTAL HIP REPLACEMENT  [4] Family  History Problem Relation Name Age of Onset  . Coronary artery disease Mother    . Stroke Mother    . Diabetes Father    . Prostate cancer Father    [5] Patient Active Problem List Diagnosis  . Tobacco use  . Idiopathic chronic gout of multiple sites without tophus  . Essential hypertension  . Normocytic anemia  . Pure hypercholesterolemia  . Right nephrolithiasis  . Right hip pain  . Pseudogout of right knee  . Hip arthritis  . Chronic pain of right knee  .  Baker's cyst of knee, right  . Acute pain of right shoulder  . Acute idiopathic gout of left knee

## 2023-12-28 NOTE — Therapy (Signed)
 OUTPATIENT PHYSICAL THERAPY LUMBAR and LOWER EXTREMITY EVALUATION   Patient Name: Terry Burton MRN: [id number] DOB:04-06-1961, 63 y.o., male Today's Date: 12/31/2023  END OF SESSION:  PT End of Session - 12/31/23 0844     Visit Number 1    Date for PT Re-Evaluation 02/25/24    Authorization Type [employer]    PT Start Time 0845    PT Stop Time 0930    PT Time Calculation (min) 45 min    Activity Tolerance Patient tolerated treatment well    Behavior During Therapy WFL for tasks assessed/performed          Past Medical History:  Diagnosis Date   Arthritis    right hip and back with pain down right leg   Gout    Past Surgical History:  Procedure Laterality Date   FRACTURE SURGERY     repair right elbow years ago- no retained hardware   TOTAL HIP ARTHROPLASTY  05/01/2011   Procedure: TOTAL HIP ARTHROPLASTY ANTERIOR APPROACH;  Surgeon: [name];  Location: [facility];  Service: Orthopedics;  Laterality: Right;   Patient Active Problem List   Diagnosis Date Noted   Baker's cyst of knee, right 10/01/2020   Acute idiopathic gout of left knee 10/01/2020   Pseudogout of right knee 10/01/2020   Chronic pain of right knee 01/26/2017   Acute pain of right shoulder 12/10/2016   Right hip pain 12/10/2016   Hip arthritis 05/01/2011    PCP: Darilyn Rosalva Lonni, PA-C   REFERRING PROVIDER: Poli Lonni CINDERELLA, MD   REFERRING DIAG:  [id number] (ICD-10-CM) - Avascular necrosis of bone of left hip (HCC)  M16.12 (ICD-10-CM) - Unilateral primary osteoarthritis, left hip  M25.552 (ICD-10-CM) - Pain of left hip  M54.42 (ICD-10-CM) - Acute left-sided low back pain with left-sided sciatica   THERAPY DIAG:  Other low back pain  Pain in left hip  Radiculopathy, lumbar region  Cramp and spasm  RATIONALE FOR EVALUATION AND TREATMENT: Rehabilitation  ONSET DATE: ~July 4th  NEXT MD VISIT: 01/10/2024   SUBJECTIVE:   SUBJECTIVE STATEMENT: Pt reports that his  previous pain in in L Low back and Hip has returned. He figured PT would help again. This pain returned around July 4th when he was in [facility], he woke up with an achy pain and states it was at a 10/10 at that time. Pain +  N/T  goes down to L knee and ankle often times.    PERTINENT HISTORY: From MD notes  He was involved in an accident where a dock that he was fishing off of collapsed recently. He was eventually seen at [facility] and had x-rays of the lumbar spine and his pelvis and left hip on October 28 of 2024.; Arthritis, Gout, H/O R THA  PAIN:  Are you having pain? Yes: NPRS scale: 6-7/10 Pain location: L Low back, knee, ankle at times  Pain description: Achy, sharp at times  Aggravating factors: Sitting a long time Relieving factors: medication, loosening it up by walking, icing, heat    PRECAUTIONS: None  RED FLAGS: None   WEIGHT BEARING RESTRICTIONS: No  FALLS:  Has patient fallen in last 6 months? 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  DIAGNOSTIC FINDINGS:  07/25/2023-MR OF THE LEFT HIP WITHOUT CONTRAST Bones: No acute fracture. No dislocation. Large geographic area of avascular necrosis of the left femoral head. Small linear site of high signal in the subchondral bone plate at the femoral head apex indicative of subchondral fracture (series 8, image 16). Preservation of the femoral head contour. Prior right total hip arthroplasty with associated metallic susceptibility artifact. Bony pelvis intact without diastasis. SI joints and pubic  symphysis within normal limits. Degenerative disc disease is evident at the L5-S1 level, not well assessed. No bone marrow edema. No marrow replacing bone lesion.  IMPRESSION: 1. Avascular necrosis of the left femoral head with small subchondral fracture at the femoral head apex. Preservation of the femoral head contour without flattening or collapse. 2. Moderate osteoarthritis of the left hip. 3. Superior labral tear  PATIENT SURVEYS:  Modified Oswestry Low Back Pain Disability Questionnaire: 10 / 50 = 20.0 % (Minimal Disability)  Lower Extremity Functional Score: 56 / 80 = 70.0 % (Minimal function disability)    COGNITION: Overall cognitive status: Within functional limits for tasks assessed     SENSATION: WFL- pt stays that N/T is present w/ pain but no loss of sensation present   EDEMA:  Not assessed   MUSCLE LENGTH: Hamstrings: Right Mild-Mod; Left Mild-Mod   IR: L Mod R mod  Piriformis: L Mod R mod   POSTURE: rounded shoulders, forward head, and flexed trunk   PALPATION: TTP: L greater trochanter, L hip flexor group, L glute med, L lumbar paraspinals   LUMBAR ROM:   Active  eval  Flexion WFL  Extension 25% limited; p!, stiffness   Right lateral flexion   Left lateral flexion   Right rotation WFL  Left rotation 25% Limited; stiffness   (Blank rows = not tested)  LOWER EXTREMITY ROM: Not assessed  Active ROM Right eval Left eval  Hip flexion [facility] WFL; p! At hip flexors  Hip extension    Hip abduction    Hip adduction    Hip internal rotation    Hip external rotation    Knee flexion    Knee extension    Ankle dorsiflexion    Ankle plantarflexion    Ankle inversion    Ankle eversion     (Blank rows = not tested)  LOWER EXTREMITY MMT:  MMT Right eval Left eval  Hip flexion 5 4- p!   Hip extension 4+ 4+  Hip abduction 4+ 4  Hip adduction 4+ 5  Hip internal rotation 4+ 4-  Hip external rotation 4+ 4-  Knee flexion 5 4+  Knee extension 5 5   Ankle dorsiflexion 5 5  Ankle plantarflexion    Ankle inversion    Ankle eversion     (Blank rows = not tested)  LUMBAR SPECIAL TESTS:  Slump test: Positive (+)  on L N/T to post calf    FUNCTIONAL TESTS:  Not assessed at eval   GAIT: Distance walked: Clinic Distance  Assistive device utilized: None Level of assistance: Complete Independence Comments: Complete independence. Pt ambulates w/ some fwd flx in the trunk and fwd head  TREATMENT DATE:    12/31/2023 SELF CARE:  Reviewed eval findings and role of PT in addressing identified deficits as well as instruction in initial HEP (see below).    PATIENT EDUCATION:  Education details: PT eval findings, anticipated POC, initial HEP, HEP review, HEP update, and potential purchase of support pillow for his long drives   Person educated: Patient Education method: Explanation, Demonstration, Verbal cues, Tactile cues, and printout of new initial HEP given  Education comprehension: verbalized understanding, returned demonstration, verbal cues required, tactile cues required, and needs further education    HOME EXERCISE PROGRAM: Access Code: [email or web address] URL: [email or web address] Date: 12/31/2023 Prepared by: Eusebio Saba   Exercises - Gastroc Stretch on Wall  - 2 x daily - 7 x weekly - 5 reps - 30 secc hold - Standing Quadratus Lumborum Stretch with Doorway  - 1 x daily - 7 x weekly - 1 sets - 3 reps - 30 sec hold - Supine Lower Trunk Rotation  - 1 x daily - 7 x weekly - 1 sets - 10 reps - Supine Hip Internal and External Rotation  - 1 x daily - 7 x weekly - 1-2 sets - 10 reps - Standing Hip Flexor Stretch on Chair  - 1 x daily - 7 x weekly - 1 sets - 3 reps - 30 sec hold - Seated Hamstring Stretch with Chair  - 1 x daily - 7 x weekly - 3 sets - 10 reps  ASSESSMENT:  CLINICAL  IMPRESSION: Terry Burton is a 63 y/o M who has been referred for evaluation and treatment for L sided lower back and hip pain that often radiates into L knee, posterior calf, and ankle at times. Pt has has PT for this same issue earlier this year with [facility] and feels as though it helped before so, he has decided to try it again this time. Pt expressed that he feels like he will eventually have to have surgery but, in the mean time he will try therapy. He states that he still has access to his previous HEP and he does some of the exercises sometimes. Pt states that this most recent episode of L hip and back pain came on when he was on vacation ~ July 4th and he woke up one morning to the pain. Pt states that he did not do anything specifically that resulted in the pain. Today's evaluation revealed some remaining deficits in ROM (refer to chart above), muscle tightness in B hamstrings, some tenderness to areas in the L greater trochanter, L glute med. Pt reports difficulty with sitting or standing for extended periods of time. He feels as though walking loosens him up and can help with pain at time. Pt had a positive slump test on the L, N/T radiated into the L posterior calf during. Molly scored a 70% (56/80) on the LEFS and a 20% on the Mod Oswestry for LBP, both indicating minimal functional disability. Pt would benefit from skilled PT to address current deficits to improve his activity tolerance and mobility w/ dec'd pain interference.   OBJECTIVE IMPAIRMENTS: Abnormal gait, decreased activity tolerance, decreased balance, decreased endurance, decreased knowledge of condition, decreased mobility, difficulty walking, decreased ROM, decreased strength, hypomobility, increased fascial restrictions, impaired perceived functional ability, increased muscle spasms, impaired flexibility, impaired sensation, improper body mechanics, postural dysfunction, and pain.   ACTIVITY LIMITATIONS: carrying, lifting,  bending, sitting, standing, squatting, sleeping, stairs, bed mobility, continence, locomotion level, and caring for others  PARTICIPATION LIMITATIONS: cleaning, laundry,  driving, shopping, community activity, occupation, and yard work  PERSONAL FACTORS: Age, Past/current experiences, Profession, Time since onset of injury/illness/exacerbation, and 3+ comorbidities: Arthritis, Gout, H/O R THA  are also affecting patient's functional outcome.   REHAB POTENTIAL: Good  CLINICAL DECISION MAKING: Evolving/moderate complexity  EVALUATION COMPLEXITY: Moderate   GOALS: Goals reviewed with patient? Yes  SHORT TERM GOALS: Target date: 01/28/2024   Patient will be independent to initial HEP.  Baseline: 12/31/23- initial HEP updated from previous one to focus on more stretching  Goal status: INITIAL  2.  Patient will report a 25% improvement in low back and L hip pain to improve current activity tolerance.  Baseline: NPRS 6-7/10 Goal status: INITIAL  3.  Patient will improve sitting tolerance to 30 minutes w/o increase in pain. Baseline: Pain increases w/ extended periods of sitting.  Goal status: INITIAL  4.  Patient will report centralization of his radicular symptoms.  Baseline: Pt's pain radiates into L knee and ankles.  Goal status: INITIAL    LONG TERM GOALS: Target date: 02/25/2024   Patient will be independent w/ advanced/ongoing HEP to improve patient outcomes and carryover.  Baseline:  Goal status: INITIAL  2.  Patient will report at least a 50% improvement in low back and L hip pain to improve QoL.  Baseline:  Goal status: INITIAL  3.  Patient will be able to swing a golf club w/o experiencing increases in LBP and L hip pain to participate in personal hobbies.  Baseline: Pt reports he has not swung a golf club and wants to try.  Goal status: INITIAL  4.  Patient will improve his LEFS score by at least 9 points to decrease current difficulties with ADLs.  Baseline: LEFS  58/80 (70%)  Goal status: INITIAL   PLAN:  PT FREQUENCY: 1x/week  PT DURATION: 8 weeks  PLANNED INTERVENTIONS: 97164- PT Re-evaluation, 97750- Physical Performance Testing, 97110-Therapeutic exercises, 97530- Therapeutic activity, 97112- Neuromuscular re-education, 97535- Self Care, [id number]- Manual therapy, [phone]- Aquatic Therapy, [phone]- Electrical stimulation (unattended), [id number]- Ultrasound, [id number]- Traction (mechanical), [id number]- Ionotophoresis 4mg /ml Dexamethasone , 79439 (1-2 muscles), [id number] (3+ muscles)- Dry Needling, Patient/Family education, Balance training, Stair training, Taping, Joint mobilization, Spinal mobilization, Cryotherapy, and Moist heat  PLAN FOR NEXT SESSION: Review and update initial HEP; Begin gentle flexibility exercises for LE via MT (STM-L glute med, hip flexors) possibly; Perform functional testing 5xSTS    Ymani Porcher, Student-PT 12/31/2023, 10:35 AM

## 2023-12-31 ENCOUNTER — Ambulatory Visit: Attending: Orthopaedic Surgery | Admitting: Physical Therapy

## 2023-12-31 ENCOUNTER — Other Ambulatory Visit: Payer: Self-pay

## 2023-12-31 ENCOUNTER — Encounter: Payer: Self-pay | Admitting: Physical Therapy

## 2023-12-31 DIAGNOSIS — M5442 Lumbago with sciatica, left side: Secondary | ICD-10-CM | POA: Diagnosis not present

## 2023-12-31 DIAGNOSIS — R252 Cramp and spasm: Secondary | ICD-10-CM | POA: Insufficient documentation

## 2023-12-31 DIAGNOSIS — M5416 Radiculopathy, lumbar region: Secondary | ICD-10-CM | POA: Diagnosis present

## 2023-12-31 DIAGNOSIS — M87052 Idiopathic aseptic necrosis of left femur: Secondary | ICD-10-CM | POA: Diagnosis not present

## 2023-12-31 DIAGNOSIS — M1612 Unilateral primary osteoarthritis, left hip: Secondary | ICD-10-CM | POA: Insufficient documentation

## 2023-12-31 DIAGNOSIS — M5459 Other low back pain: Secondary | ICD-10-CM | POA: Insufficient documentation

## 2023-12-31 DIAGNOSIS — M25552 Pain in left hip: Secondary | ICD-10-CM | POA: Insufficient documentation

## 2024-01-07 ENCOUNTER — Ambulatory Visit: Admitting: Physical Therapy

## 2024-01-07 ENCOUNTER — Encounter: Payer: Self-pay | Admitting: Physical Therapy

## 2024-01-07 DIAGNOSIS — M25552 Pain in left hip: Secondary | ICD-10-CM

## 2024-01-07 DIAGNOSIS — M5416 Radiculopathy, lumbar region: Secondary | ICD-10-CM

## 2024-01-07 DIAGNOSIS — M5459 Other low back pain: Secondary | ICD-10-CM

## 2024-01-07 DIAGNOSIS — R252 Cramp and spasm: Secondary | ICD-10-CM

## 2024-01-07 NOTE — Therapy (Signed)
 OUTPATIENT PHYSICAL THERAPY LUMBAR and LOWER EXTREMITY TREATMENT   Patient Name: Terry Burton MRN: [id number] DOB:[birth year], 63 y.o., male Today's Date: 01/07/2024  END OF SESSION:  PT End of Session - 01/07/24 1014     Visit Number 2    Date for PT Re-Evaluation 02/25/24    Authorization Type [employer]    PT Start Time 1015    PT Stop Time 1100    PT Time Calculation (min) 45 min    Activity Tolerance Patient tolerated treatment well    Behavior During Therapy WFL for tasks assessed/performed          Past Medical History:  Diagnosis Date   Arthritis    right hip and back with pain down right leg   Gout    Past Surgical History:  Procedure Laterality Date   FRACTURE SURGERY     repair right elbow years ago- no retained hardware   TOTAL HIP ARTHROPLASTY  05/01/2011   Procedure: TOTAL HIP ARTHROPLASTY ANTERIOR APPROACH;  Surgeon: [name];  Location: [facility];  Service: Orthopedics;  Laterality: Right;   Patient Active Problem List   Diagnosis Date Noted   Baker's cyst of knee, right 10/01/2020   Acute idiopathic gout of left knee 10/01/2020   Pseudogout of right knee 10/01/2020   Chronic pain of right knee 01/26/2017   Acute pain of right shoulder 12/10/2016   Right hip pain 12/10/2016   Hip arthritis 05/01/2011    PCP: Darilyn Rosalva Lonni, PA-C   REFERRING PROVIDER: Poli Lonni CINDERELLA, MD   REFERRING DIAG:  [phone] (ICD-10-CM) - Avascular necrosis of bone of left hip (HCC)  M16.12 (ICD-10-CM) - Unilateral primary osteoarthritis, left hip  M25.552 (ICD-10-CM) - Pain of left hip  M54.42 (ICD-10-CM) - Acute left-sided low back pain with left-sided sciatica   THERAPY DIAG:  Other low back pain  Pain in left hip  Radiculopathy, lumbar region  Cramp and spasm  RATIONALE FOR EVALUATION AND TREATMENT: Rehabilitation  ONSET DATE: ~July 4th  NEXT MD VISIT: 01/10/2024   SUBJECTIVE:   SUBJECTIVE STATEMENT: Pt reports that his pain  comes and goes. Some days its a full day no pain, next day he has his radiating pain again. Mostly today he has had no pain. HEP is going pretty good. His hip has been the same as for as coming and going, states it is still a sharp pain at times.   EVAL: Pt reports that his previous pain in in L Low back and Hip has returned. He figured PT would help again. This pain returned around July 4th when he was in [facility], he woke up with an achy pain and states it was at a 10/10 at that time. Pain +  N/T  goes down to L knee and ankle often times.    PERTINENT HISTORY: From MD notes  He was involved in an accident where a dock that he was fishing off of collapsed recently. He was eventually seen at [facility] and had x-rays of the lumbar spine and his pelvis and left hip on October 28 of 2024.; Arthritis, Gout, H/O R THA  PAIN:  Are you having pain? Yes: NPRS scale: 3/10 Pain location: L Low back, knee, ankle at times  Pain description: Achy, sharp at times  Aggravating factors: Sitting a long time Relieving factors: medication, loosening it up by walking, icing, heat    PRECAUTIONS: None  RED FLAGS: None   WEIGHT BEARING RESTRICTIONS: No  FALLS:  Has patient fallen in last 6 months? 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  DIAGNOSTIC FINDINGS:  07/25/2023-MR OF THE LEFT HIP WITHOUT CONTRAST Bones: No acute fracture. No dislocation. Large geographic area of avascular necrosis of the left femoral head. Small linear site of high signal in the subchondral bone  plate at the femoral head apex indicative of subchondral fracture (series 8, image 16). Preservation of the femoral head contour. Prior right total hip arthroplasty with associated metallic susceptibility artifact. Bony pelvis intact without diastasis. SI joints and pubic symphysis within normal limits. Degenerative disc disease is evident at the L5-S1 level, not well assessed. No bone marrow edema. No marrow replacing bone lesion.  IMPRESSION: 1. Avascular necrosis of the left femoral head with small subchondral fracture at the femoral head apex. Preservation of the femoral head contour without flattening or collapse. 2. Moderate osteoarthritis of the left hip. 3. Superior labral tear  PATIENT SURVEYS:  Modified Oswestry Low Back Pain Disability Questionnaire: 10 / 50 = 20.0 % (Minimal Disability)  Lower Extremity Functional Score: 56 / 80 = 70.0 % (Minimal function disability)    COGNITION: Overall cognitive status: Within functional limits for tasks assessed     SENSATION: WFL- pt stays that N/T is present w/ pain but no loss of sensation present   EDEMA:  Not assessed   MUSCLE LENGTH: Hamstrings: Right Mild-Mod; Left Mild-Mod   IR: L Mod R mod  Piriformis: L Mod R mod   POSTURE: rounded shoulders, forward head, and flexed trunk   PALPATION: TTP: L greater trochanter, L hip flexor group, L glute med, L lumbar paraspinals   LUMBAR ROM:   Active  eval  Flexion WFL  Extension 25% limited; p!, stiffness   Right lateral flexion   Left lateral flexion   Right rotation WFL  Left rotation 25% Limited; stiffness   (Blank rows = not tested)  LOWER EXTREMITY ROM: [facility] 01/07/24   (Blank rows = not tested)  LOWER EXTREMITY MMT:  MMT Right eval Left eval  Hip flexion 5 4- p!   Hip extension 4+ 4+  Hip abduction 4+ 4  Hip adduction 4+ 5  Hip internal rotation 4+ 4-  Hip external rotation 4+ 4-  Knee flexion 5 4+  Knee extension 5 5  Ankle dorsiflexion 5 5   Ankle plantarflexion    Ankle inversion    Ankle eversion     (Blank rows = not tested)  LUMBAR SPECIAL TESTS:  Slump test: Positive (+)  on L N/T to post calf    FUNCTIONAL TESTS:  Not assessed at eval   GAIT: Distance walked: Clinic Distance  Assistive device utilized: None Level of assistance: Complete Independence Comments: Complete independence. Pt ambulates w/ some fwd flx in the trunk and fwd head  TREATMENT DATE:   01/07/24 THERAPEUTIC EXERCISE: To improve strength, endurance, and flexibility.  Demonstration, verbal and tactile cues throughout for technique.  NuStep- L5x81min  Seated HS stretch 2x30 B (cues to maintain upright posture during)  Hip Flexor stretch on edge of table 2x30 B: needed to walk a few steps between  Standing Quadratus Lumborum Stretch with Doorway 2x30 B  Gastroc Stretch on Wall 2x30  Supine DKTC w/ green Pball x10  Supine LTRs w/ orange Pball x20  Supine Hip Internal and External Rotation x10: pt struggled achieving movement required tactile and verbal cues, states he does not do this one at home  Hooklying Bridge w/ TrA 2x10: reports some soreness  S/L Hip Abd 2x10  Performed 5xSTS: 11.3s  Seated Marches 2x1', 2# B  SELF CARE:  Reviewed initial HEP w/ pt to ensure proper positioning & timing for each exercises.   12/31/2023 SELF CARE:  Reviewed eval findings and role of PT in addressing identified deficits as well as instruction current HEP (see below).    PATIENT EDUCATION:  Education details: PT eval findings, anticipated POC, initial HEP, HEP review, HEP update, and potential purchase of support pillow for his long drives   Person educated: Patient Education method: Explanation, Demonstration, Verbal cues, Tactile cues, and printout of new initial HEP given  Education comprehension: verbalized understanding,  returned demonstration, verbal cues required, tactile cues required, and needs further education    HOME EXERCISE PROGRAM: Access Code: [email or web address] URL: [email or web address] Date: 01/07/2024 Prepared by: Eusebio Saba  Exercises - Gastroc Stretch on Wall  - 2 x daily - 7 x weekly - 5 reps - 30 secc hold - Standing Quadratus Lumborum Stretch with Doorway  - 1 x daily - 7 x weekly - 1 sets - 3 reps - 30 sec hold - Supine Lower Trunk Rotation  - 1 x daily - 7 x weekly - 1 sets - 10 reps - Standing Hip Flexor Stretch on Chair  - 1 x daily - 7 x weekly - 1 sets - 3 reps - 30 sec hold - Seated Hamstring Stretch with Chair  - 1 x daily - 7 x weekly - 3 sets - 10 reps - Supine Bridge  - 1 x daily - 7 x weekly - 3 sets - 10 reps - 5 second hold  ASSESSMENT:  CLINICAL IMPRESSION: Terry Burton reported on and off episodes of his LBP and L hip pain since his evaluation. He stated that he is feeling good about his HEP and expressed wanting to review his exercises with us  today. He was able to perform the exercises as review however, he did require both verbal and tactile cuing in regards to positioning and maintaining well aligned posture as he slouched with activities done in sitting. Pt needed to walk around between stretching activities and stated that it helped him to loosen up. Began introducing gentle strengthening exercises today. Pt was introduced to hook lying bridges today w/ cues to engage core before lifting his hips from the table. Pt was unable to achieve fully motion for supine IR/ER exercise today, pt had difficulty letting the knees fall out to the sides on his L more so than the R consistent with noted LE muscle tightness/stiffness. Performed 5xSTS today in 11.3s which is appropriate for his age, pt did not require any UE support throughout the test. Pt will benefit from skilled PT to address deficits in strength and flexibility to improve mobility and activity tolerance w/ dec'd  pain interference.  EVAL: Vicenta PARAS. Bulnes is a 63 y/o M who has been referred for evaluation and treatment for L sided lower back and hip pain that often radiates into L knee, posterior calf, and ankle at times. Pt has has PT for this same issue earlier this year with [facility] and feels as though it helped before so, he has decided to try it again this time. Pt expressed that he feels like he will eventually have to have surgery but, in the mean time he will try therapy. He states that he still has access to his previous HEP and he does some of the exercises sometimes. Pt states that this most recent episode of L hip and back pain came on when he was on vacation ~ July 4th and he woke up one morning to the pain. Pt states that he did not do anything specifically that resulted in the pain. Today's evaluation revealed some remaining deficits in ROM (refer to chart above), muscle tightness in B hamstrings, some tenderness to areas in the L greater trochanter, L glute med. Pt reports difficulty with sitting or standing for extended periods of time. He feels as though walking loosens him up and can help with pain at time. Pt had a positive slump test on the L, N/T radiated into the L posterior calf during. Hester scored a 70% (56/80) on the LEFS and a 20% on the Mod Oswestry for LBP, both indicating minimal functional disability. Pt would benefit from skilled PT to address current deficits to improve his activity tolerance and mobility w/ dec'd pain interference.   OBJECTIVE IMPAIRMENTS: Abnormal gait, decreased activity tolerance, decreased balance, decreased endurance, decreased knowledge of condition, decreased mobility, difficulty walking, decreased ROM, decreased strength, hypomobility, increased fascial restrictions, impaired perceived functional ability, increased muscle spasms, impaired flexibility, impaired sensation, improper body mechanics, postural dysfunction, and pain.   ACTIVITY LIMITATIONS: carrying,  lifting, bending, sitting, standing, squatting, sleeping, stairs, bed mobility, continence, locomotion level, and caring for others  PARTICIPATION LIMITATIONS: cleaning, laundry, driving, shopping, community activity, occupation, and yard work  PERSONAL FACTORS: Age, Past/current experiences, Profession, Time since onset of injury/illness/exacerbation, and 3+ comorbidities: Arthritis, Gout, H/O R THA  are also affecting patient's functional outcome.   REHAB POTENTIAL: Good  CLINICAL DECISION MAKING: Evolving/moderate complexity  EVALUATION COMPLEXITY: Moderate   GOALS: Goals reviewed with patient? Yes  SHORT TERM GOALS: Target date: 01/28/2024   Patient will be independent to initial HEP.  Baseline: 12/31/23- initial HEP updated from previous one to focus on more stretching  Goal status: IN PROGRESS-01/07/24  2.  Patient will report a 25% improvement in low back and L hip pain to improve current activity tolerance.  Baseline: NPRS 6-7/10 Goal status: INITIAL   3.  Patient will improve sitting tolerance to 30 minutes w/o increase in pain. Baseline: Pain increases w/ extended periods of sitting.  Goal status: INITIAL   4.  Patient will report centralization of his radicular symptoms.  Baseline: Pt's pain radiates into L knee and ankles.  Goal status: INITIAL    LONG TERM GOALS: Target date: 02/25/2024   Patient will be independent w/ advanced/ongoing HEP to improve patient outcomes and carryover.  Baseline:  Goal status: INITIAL  2.  Patient will report at least a 50% improvement in low back and L hip pain to improve QoL.  Baseline:  Goal status: INITIAL  3.  Patient will be able to swing a golf club w/o experiencing increases in LBP and L hip pain to participate in  personal hobbies.  Baseline: Pt reports he has not swung a golf club and wants to try.  Goal status: INITIAL  4.  Patient will improve his LEFS score by at least 9 points to decrease current difficulties with  ADLs.  Baseline: LEFS 58/80 (70%)  Goal status: INITIAL   PLAN:  PT FREQUENCY: 1x/week  PT DURATION: 8 weeks  PLANNED INTERVENTIONS: 97164- PT Re-evaluation, 97750- Physical Performance Testing, 97110-Therapeutic exercises, 97530- Therapeutic activity, 97112- Neuromuscular re-education, 97535- Self Care, [id number]- Manual therapy, [phone]- Aquatic Therapy, [phone]- Electrical stimulation (unattended), [id number]- Ultrasound, [id number]- Traction (mechanical), [id number]- Ionotophoresis 4mg /ml Dexamethasone , 79439 (1-2 muscles), 20561 (3+ muscles)- Dry Needling, Patient/Family education, Balance training, Stair training, Taping, Joint mobilization, Spinal mobilization, Cryotherapy, and Moist heat  PLAN FOR NEXT SESSION: Review and update HEP as needed; continue flexibility exercises for LE via MT (STM-L glute med, hip flexors) possibly; continue strengthening exercises for LE/core   [employer], Student-PT 01/07/2024, 11:33 AM

## 2024-01-10 ENCOUNTER — Encounter: Payer: Self-pay | Admitting: Orthopaedic Surgery

## 2024-01-10 ENCOUNTER — Ambulatory Visit: Admitting: Orthopaedic Surgery

## 2024-01-10 DIAGNOSIS — M1612 Unilateral primary osteoarthritis, left hip: Secondary | ICD-10-CM | POA: Diagnosis not present

## 2024-01-10 DIAGNOSIS — M87052 Idiopathic aseptic necrosis of left femur: Secondary | ICD-10-CM | POA: Diagnosis not present

## 2024-01-10 DIAGNOSIS — M25552 Pain in left hip: Secondary | ICD-10-CM | POA: Diagnosis not present

## 2024-01-10 NOTE — Progress Notes (Signed)
 The patient is well-known to us .  He is 63 year old gentleman who we are following for left hip osteoarthritis with some osteonecrosis.  He had been dealing with some hip issues and hip arthritis for a while but then had a hard fall off of a dock that he collapsed and this did accelerate knee pain and arthritis in his left hip and he developed a degree of osteonecrosis.  This is affected his gait and is caused him to have some back pain and knee pain.  He is currently in physical therapy now and that has been helpful mainly for his back and his hip.  He is an avid golfer as well.  We replaced his right hip with an anterior approach weight back in 2012.  He is a thin individual as well.  At this point he would like to consider hip replacement sometime toward the end of the year in late November or early December.  He is continue to go through physical therapy as well.  Examination of his right hip shows it is smoothly and fluidly.  The left hip has decreased internal and external rotation as well as pain in the groin with rotation.  His left knee shows no effusion with good range of motion and is ligamentously stable.  I do believe the left hip is the main source of his pain as a relates to his knee and his back.  He will continue physical therapy and we will work on getting him scheduled for a left total hip arthroplasty to treat his severe left hip arthritis toward the end of the year.  In the interim he will continue to avoid high impact aerobic activities and continue the strengthening program.

## 2024-01-14 ENCOUNTER — Ambulatory Visit

## 2024-01-21 ENCOUNTER — Encounter: Admitting: Physical Therapy

## 2024-01-28 ENCOUNTER — Encounter

## 2024-02-04 ENCOUNTER — Encounter: Payer: Self-pay | Admitting: Physical Therapy

## 2024-02-04 ENCOUNTER — Ambulatory Visit: Attending: Orthopaedic Surgery | Admitting: Physical Therapy

## 2024-02-04 DIAGNOSIS — R252 Cramp and spasm: Secondary | ICD-10-CM | POA: Diagnosis present

## 2024-02-04 DIAGNOSIS — M25552 Pain in left hip: Secondary | ICD-10-CM | POA: Insufficient documentation

## 2024-02-04 DIAGNOSIS — M5459 Other low back pain: Secondary | ICD-10-CM | POA: Diagnosis present

## 2024-02-04 DIAGNOSIS — M5416 Radiculopathy, lumbar region: Secondary | ICD-10-CM | POA: Insufficient documentation

## 2024-02-04 NOTE — Therapy (Signed)
 OUTPATIENT PHYSICAL THERAPY TREATMENT   Patient Name: Terry Burton MRN: [id number] DOB:09-07-1960, 63 y.o., male Today's Date: 02/04/2024  END OF SESSION:  PT End of Session - 02/04/24 0847     Visit Number 3    Date for PT Re-Evaluation 02/25/24    Authorization Type BC[employer]    PT Start Time [phone]    PT Stop Time 0930    PT Time Calculation (min) 43 min    Activity Tolerance Patient tolerated treatment well    Behavior During Therapy WFL for tasks assessed/performed           Past Medical History:  Diagnosis Date   Arthritis    right hip and back with pain down right leg   Gout    Past Surgical History:  Procedure Laterality Date   FRACTURE SURGERY     repair right elbow years ago- no retained hardware   TOTAL HIP ARTHROPLASTY  05/01/2011   Procedure: TOTAL HIP ARTHROPLASTY ANTERIOR APPROACH;  Surgeon: [name];  Location: [facility];  Service: Orthopedics;  Laterality: Right;   Patient Active Problem List   Diagnosis Date Noted   Baker's cyst of knee, right 10/01/2020   Acute idiopathic gout of left knee 10/01/2020   Pseudogout of right knee 10/01/2020   Chronic pain of right knee 01/26/2017   Acute pain of right shoulder 12/10/2016   Right hip pain 12/10/2016   Hip arthritis 05/01/2011    PCP: Darilyn Rosalva Lonni, PA-C   REFERRING PROVIDER: Poli Lonni CINDERELLA, MD   REFERRING DIAG:  [phone] (ICD-10-CM) - Avascular necrosis of bone of left hip (HCC)  M16.12 (ICD-10-CM) - Unilateral primary osteoarthritis, left hip  M25.552 (ICD-10-CM) - Pain of left hip  M54.42 (ICD-10-CM) - Acute left-sided low back pain with left-sided sciatica   THERAPY DIAG:  Other low back pain  Pain in left hip  Radiculopathy, lumbar region  Cramp and spasm  RATIONALE FOR EVALUATION AND TREATMENT: Rehabilitation  ONSET DATE: ~July 4th  NEXT MD VISIT: None scheduled - possible L THA in late Nov/early Dec   SUBJECTIVE:   SUBJECTIVE STATEMENT: Pt  reports he continues to do his exercises but the pain still goes up and down.  The pain was pretty bad over the past 2 weeks but seems to be easing up as he was getting ready to come to PT.  Pain not shooting up and down leg today like it was several times over the past few weeks.  EVAL: Pt reports that his previous pain in in L Low back and Hip has returned. He figured PT would help again. This pain returned around July 4th when he was in [facility], he woke up with an achy pain and states it was at a 10/10 at that time. Pain +  N/T  goes down to L knee and ankle often times.    PERTINENT HISTORY: From MD notes  He was involved in an accident where a dock that he was fishing off of collapsed recently. He was eventually seen at [facility] and had x-rays of the lumbar spine and his pelvis and left hip on October 28 of 2024.; Arthritis, Gout, H/O R THA  PAIN:  Are you having pain? Yes: NPRS scale: 7/10 Pain location: L Low back & hip Pain description: soreness  Aggravating factors: Sitting a long time while driving Relieving factors: stretching, walking around, doing exercises    PRECAUTIONS: None  RED FLAGS: None   WEIGHT BEARING RESTRICTIONS: No  FALLS:  Has patient fallen in last 6 months? 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  DIAGNOSTIC FINDINGS:  07/25/2023 - MR OF THE LEFT HIP WITHOUT CONTRAST Bones: No acute fracture. No dislocation. Large geographic area of avascular necrosis of the left femoral head. Small linear site of high signal in the  subchondral bone plate at the femoral head apex indicative of subchondral fracture (series 8, image 16). Preservation of the femoral head contour. Prior right total hip arthroplasty with associated metallic susceptibility artifact. Bony pelvis intact without diastasis. SI joints and pubic symphysis within normal limits. Degenerative disc disease is evident at the L5-S1 level, not well assessed. No bone marrow edema. No marrow replacing bone lesion.  IMPRESSION: 1. Avascular necrosis of the left femoral head with small subchondral fracture at the femoral head apex. Preservation of the femoral head contour without flattening or collapse. 2. Moderate osteoarthritis of the left hip. 3. Superior labral tear  03/29/23 - DG Lumbar spine IMPRESSION: No acute fracture or traumatic listhesis of the lumbar spine.   PATIENT SURVEYS:  Modified Oswestry Low Back Pain Disability Questionnaire: 10 / 50 = 20.0 % (Minimal Disability)  Lower Extremity Functional Score: 56 / 80 = 70.0 % (Minimal function disability)    COGNITION: Overall cognitive status: Within functional limits for tasks assessed     SENSATION: WFL- pt stays that N/T is present w/ pain but no loss of sensation present   EDEMA:  Not assessed   MUSCLE LENGTH: Hamstrings: Right Mild-Mod; Left Mild-Mod   IR: L Mod R mod  Piriformis: L Mod R mod   POSTURE: rounded shoulders, forward head, and flexed trunk   PALPATION: TTP: L greater trochanter, L hip flexor group, L glute med, L lumbar paraspinals   LUMBAR ROM:   Active  eval  Flexion WFL  Extension 25% limited; p!, stiffness   Right lateral flexion   Left lateral flexion   Right rotation WFL  Left rotation 25% Limited; stiffness   (Blank rows = not tested)  LOWER EXTREMITY ROM: [facility] 01/07/24   (Blank rows = not tested)  LOWER EXTREMITY MMT:  MMT Right eval Left eval  Hip flexion 5 4- p!   Hip extension 4+ 4+  Hip abduction 4+ 4  Hip adduction 4+ 5   Hip internal rotation 4+ 4-  Hip external rotation 4+ 4-  Knee flexion 5 4+  Knee extension 5 5  Ankle dorsiflexion 5 5  Ankle plantarflexion    Ankle inversion    Ankle eversion     (Blank rows = not tested)  LUMBAR SPECIAL TESTS:  Slump test: Positive (+)  on L N/T to post calf    FUNCTIONAL TESTS:  Not assessed at eval   GAIT: Distance walked: Clinic Distance  Assistive device utilized: None Level of assistance: Complete Independence Comments: Complete independence. Pt ambulates w/ some fwd flx in the trunk and fwd head  TREATMENT DATE:    02/04/24 THERAPEUTIC EXERCISE: To improve strength, endurance, ROM, and flexibility.  Demonstration, verbal and tactile cues throughout for technique.  Rec Bike - L 3 x 6 min Standing QL stretch at doorframe x 30 Lumbar extension at wall 10 x 5 - Pt noting increased discomfort in L flank/low back Seated 3-way lumbar flexion stretch 2 x 30 each, 1st set with UE support on green Pball, 2nd set with UE support on SPC Seated HS stretch with foot elevated in chair - deferred as pt flexing excessively in spine Seated hip hinge HS stretch 2 x 30 bil Standing hip flexor stretch with LE elevated on chair behind patient x 30 Supine LTR 10 x 5 Bridge + GTB hip ABD isometric 10 x 5 Hooklying TrA + GTB alt unilateral bent knee fallout 10 x 3 Hooklying TrA + GTB alt unilateral hip flexion march 10 x 3   01/07/24 THERAPEUTIC EXERCISE: To improve strength, endurance, and flexibility.  Demonstration, verbal and tactile cues throughout for technique.  NuStep- L5x63min  Seated HS stretch 2x30 B (cues to maintain upright posture during)  Hip Flexor stretch on edge of table 2x30 B: needed to walk a few steps between  Standing Quadratus Lumborum Stretch with Doorway 2x30 B  Gastroc Stretch on Wall 2x30  Supine DKTC  w/ green Pball x10  Supine LTRs w/ orange Pball x20  Supine Hip Internal and External Rotation x10: pt struggled achieving movement required tactile and verbal cues, states he does not do this one at home  Hooklying Bridge w/ TrA 2x10: reports some soreness  S/L Hip Abd 2x10  Performed 5xSTS: 11.3s  Seated Marches 2x1', 2# B   SELF CARE:  Reviewed initial HEP w/ pt to ensure proper positioning & timing for each exercises.    12/31/2023 SELF CARE:  Reviewed eval findings and role of PT in addressing identified deficits as well as instruction current HEP (see below).    PATIENT EDUCATION:  Education details: HEP review and HEP update  Person educated: Patient Education method: Explanation, Demonstration, Verbal cues, Tactile cues, and Handouts Education comprehension: verbalized understanding, returned demonstration, verbal cues required, tactile cues required, and needs further education    HOME EXERCISE PROGRAM: Access Code: [email or web address] URL: [email or web address] Date: 02/04/2024 Prepared by: Elijah Hidden  Exercises - Gastroc Stretch on Wall  - 1-2 x daily - 7 x weekly - 5 reps - 30 secc hold - Standing Quadratus Lumborum Stretch with Doorway  - 1-2 x daily - 7 x weekly - 1 sets - 3 reps - 30 sec hold - Standing Hip Flexor Stretch on Chair  - 1-2 x daily - 7 x weekly - 1 sets - 3 reps - 30 sec hold - Seated Hamstring Stretch  - 1-2 x daily - 7 x weekly - 3 reps - 30 sec hold - Seated 3 Way Exercise Ball Roll Out Stretch  - 1-2 x daily - 7 x weekly - 2 sets - 3 reps - 30 sec hold - Supine Lower Trunk Rotation  - 1 x daily - 7 x weekly - 1 sets - 10 reps - 5 sec hold - Supine Bridge with Resistance Band  - 1 x daily - 7 x weekly - 2-3 sets - 10 reps - 5 sec hold - Hooklying Single Leg Bent Knee Fallouts with Resistance  - 1 x daily - 7 x weekly - 2 sets - 10 reps - 3 sec hold - Supine March with Resistance Band  -  1 x daily - 7 x weekly - 2 sets - 10 reps - 2-3 sec hold  hold   ASSESSMENT:  CLINICAL IMPRESSION: Pepe returns to PT after ~1 month absence.  He reports his pain continues to fluctuate and thinks he may need to proceed with the THA surgery late this year, but does report 40-45% overall improvement in pain since starting PT as well as improving sitting tolerance.  HEP reviewed modifying a few exercises for better positioning and tolerance.  Progressed lumbopelvic strengthening with HEP updated to reflect progression. Ayoub is demonstrating progress toward his PT goals with majority of STGs now met.  He will benefit from continued skilled PT to address ongoing ROM/flexibility and strength deficits to improve mobility and activity tolerance with decreased pain interference.   EVAL: Vicenta PARAS. Schnitzer is a 63 y/o M who has been referred for evaluation and treatment for L sided lower back and hip pain that often radiates into L knee, posterior calf, and ankle at times. Pt has has PT for this same issue earlier this year with [facility] and feels as though it helped before so, he has decided to try it again this time. Pt expressed that he feels like he will eventually have to have surgery but, in the mean time he will try therapy. He states that he still has access to his previous HEP and he does some of the exercises sometimes. Pt states that this most recent episode of L hip and back pain came on when he was on vacation ~ July 4th and he woke up one morning to the pain. Pt states that he did not do anything specifically that resulted in the pain. Today's evaluation revealed some remaining deficits in ROM (refer to chart above), muscle tightness in B hamstrings, some tenderness to areas in the L greater trochanter, L glute med. Pt reports difficulty with sitting or standing for extended periods of time. He feels as though walking loosens him up and can help with pain at time. Pt had a positive slump test on the L, N/T radiated into the L posterior calf during. Silver  scored a 70% (56/80) on the LEFS and a 20% on the Mod Oswestry for LBP, both indicating minimal functional disability. Pt would benefit from skilled PT to address current deficits to improve his activity tolerance and mobility w/ dec'd pain interference.   OBJECTIVE IMPAIRMENTS: Abnormal gait, decreased activity tolerance, decreased balance, decreased endurance, decreased knowledge of condition, decreased mobility, difficulty walking, decreased ROM, decreased strength, hypomobility, increased fascial restrictions, impaired perceived functional ability, increased muscle spasms, impaired flexibility, impaired sensation, improper body mechanics, postural dysfunction, and pain.   ACTIVITY LIMITATIONS: carrying, lifting, bending, sitting, standing, squatting, sleeping, stairs, bed mobility, continence, locomotion level, and caring for others  PARTICIPATION LIMITATIONS: cleaning, laundry, driving, shopping, community activity, occupation, and yard work  PERSONAL FACTORS: Age, Past/current experiences, Profession, Time since onset of injury/illness/exacerbation, and 3+ comorbidities: Arthritis, Gout, H/O R THA  are also affecting patient's functional outcome.   REHAB POTENTIAL: Good  CLINICAL DECISION MAKING: Evolving/moderate complexity  EVALUATION COMPLEXITY: Moderate   GOALS: Goals reviewed with patient? Yes  SHORT TERM GOALS: Target date: 01/28/2024   Patient will be independent to initial HEP.  Baseline: 12/31/23- initial HEP updated from previous one to focus on more stretching  Goal status: MET - 02/04/24 - minor adjustments made with good return demostration   2.  Patient will report a 25% improvement in low back and L hip pain to improve  current activity tolerance.  Baseline: NPRS 6-7/10 Goal status: MET - 02/04/24 -  Pt reports 40-45% improvement in pain   3.  Patient will improve sitting tolerance to 30 minutes w/o increase in pain. Baseline: Pain increases w/ extended periods of sitting.   Goal status: MET - 02/04/24 - Pt reports he is able to sit for 1.5 hours before having to move around due to pain  4.  Patient will report centralization of his radicular symptoms.  Baseline: Pt's pain radiates into L knee and ankles.  Goal status: IN PROGRESS - 02/04/24 - still having intermittent pain down L LE to ankle  LONG TERM GOALS: Target date: 02/25/2024   Patient will be independent w/ advanced/ongoing HEP to improve patient outcomes and carryover.  Baseline:  Goal status: INITIAL  2.  Patient will report at least a 50% improvement in low back and L hip pain to improve QoL.  Baseline:  Goal status: INITIAL  3.  Patient will be able to swing a golf club w/o experiencing increases in LBP and L hip pain to participate in personal hobbies.  Baseline: Pt reports he has not swung a golf club and wants to try.  Goal status: INITIAL  4.  Patient will improve his LEFS score by at least 9 points to decrease current difficulties with ADLs.  Baseline: LEFS 58/80 (70%)  Goal status: INITIAL   PLAN:  PT FREQUENCY: 1x/week  PT DURATION: 8 weeks  PLANNED INTERVENTIONS: 97164- PT Re-evaluation, 97750- Physical Performance Testing, 97110-Therapeutic exercises, 97530- Therapeutic activity, 97112- Neuromuscular re-education, 97535- Self Care, [id number]- Manual therapy, [phone]- Aquatic Therapy, [id number]- Electrical stimulation (unattended), [id number]- Ultrasound, [id number]- Traction (mechanical), [id number]- Ionotophoresis 4mg /ml Dexamethasone , [id number] (1-2 muscles), [id number] (3+ muscles)- Dry Needling, Patient/Family education, Balance training, Stair training, Taping, Joint mobilization, Spinal mobilization, Cryotherapy, and Moist heat  PLAN FOR NEXT SESSION: Continue flexibility exercises and strengthening exercises for LE/core - review and update HEP as needed; MT (STM-L glute med, hip flexors) possibly    Elijah CHRISTELLA Hidden, PT 02/04/2024, 9:35 AM

## 2024-02-18 ENCOUNTER — Ambulatory Visit

## 2024-02-18 DIAGNOSIS — M5459 Other low back pain: Secondary | ICD-10-CM

## 2024-02-18 DIAGNOSIS — M5416 Radiculopathy, lumbar region: Secondary | ICD-10-CM

## 2024-02-18 DIAGNOSIS — M25552 Pain in left hip: Secondary | ICD-10-CM

## 2024-02-18 DIAGNOSIS — R252 Cramp and spasm: Secondary | ICD-10-CM

## 2024-02-18 NOTE — Therapy (Signed)
 OUTPATIENT PHYSICAL THERAPY TREATMENT   Patient Name: URBAN NAVAL MRN: [id number] DOB:1961/01/22, 63 y.o., male Today's Date: 02/18/2024  END OF SESSION:  PT End of Session - 02/18/24 1100     Visit Number 4    Date for Recertification  02/25/24    Authorization Type BCBS    PT Start Time 1018    PT Stop Time 1100    PT Time Calculation (min) 42 min    Activity Tolerance Patient tolerated treatment well    Behavior During Therapy WFL for tasks assessed/performed            Past Medical History:  Diagnosis Date   Arthritis    right hip and back with pain down right leg   Gout    Past Surgical History:  Procedure Laterality Date   FRACTURE SURGERY     repair right elbow years ago- no retained hardware   TOTAL HIP ARTHROPLASTY  05/01/2011   Procedure: TOTAL HIP ARTHROPLASTY ANTERIOR APPROACH;  Surgeon: [name];  Location: [facility];  Service: Orthopedics;  Laterality: Right;   Patient Active Problem List   Diagnosis Date Noted   Baker's cyst of knee, right 10/01/2020   Acute idiopathic gout of left knee 10/01/2020   Pseudogout of right knee 10/01/2020   Chronic pain of right knee 01/26/2017   Acute pain of right shoulder 12/10/2016   Right hip pain 12/10/2016   Hip arthritis 05/01/2011    PCP: Darilyn Rosalva Lonni, PA-C   REFERRING PROVIDER: Poli Lonni CINDERELLA, MD   REFERRING DIAG:  [id number] (ICD-10-CM) - Avascular necrosis of bone of left hip (HCC)  M16.12 (ICD-10-CM) - Unilateral primary osteoarthritis, left hip  M25.552 (ICD-10-CM) - Pain of left hip  M54.42 (ICD-10-CM) - Acute left-sided low back pain with left-sided sciatica   THERAPY DIAG:  Other low back pain  Pain in left hip  Radiculopathy, lumbar region  Cramp and spasm  RATIONALE FOR EVALUATION AND TREATMENT: Rehabilitation  ONSET DATE: ~July 4th  NEXT MD VISIT: None scheduled - possible L THA in late Nov/early Dec   SUBJECTIVE:   SUBJECTIVE  STATEMENT: Getting THA on Oct 31st  EVAL: Pt reports that his previous pain in in L Low back and Hip has returned. He figured PT would help again. This pain returned around July 4th when he was in [facility], he woke up with an achy pain and states it was at a 10/10 at that time. Pain +  N/T  goes down to L knee and ankle often times.    PERTINENT HISTORY: From MD notes  He was involved in an accident where a dock that he was fishing off of collapsed recently. He was eventually seen at [facility] and had x-rays of the lumbar spine and his pelvis and left hip on October 28 of 2024.; Arthritis, Gout, H/O R THA  PAIN:  Are you having pain? Yes: NPRS scale: 3/10 Pain location: L Low back & hip Pain description: soreness  Aggravating factors: Sitting a long time while driving Relieving factors: stretching, walking around, doing exercises    PRECAUTIONS: None  RED FLAGS: None   WEIGHT BEARING RESTRICTIONS: No  FALLS:  Has patient fallen in last 6 months? 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  DIAGNOSTIC FINDINGS:  07/25/2023 - MR OF THE LEFT HIP WITHOUT CONTRAST Bones: No acute fracture. No dislocation. Large geographic area of avascular necrosis of the left femoral head. Small linear site of high signal in the subchondral bone plate at the femoral head apex indicative of subchondral fracture (series 8, image 16). Preservation of the femoral head contour. Prior right total hip arthroplasty with associated metallic susceptibility artifact. Bony pelvis  intact without diastasis. SI joints and pubic symphysis within normal limits. Degenerative disc disease is evident at the L5-S1 level, not well assessed. No bone marrow edema. No marrow replacing bone lesion.  IMPRESSION: 1. Avascular necrosis of the left femoral head with small subchondral fracture at the femoral head apex. Preservation of the femoral head contour without flattening or collapse. 2. Moderate osteoarthritis of the left hip. 3. Superior labral tear  03/29/23 - DG Lumbar spine IMPRESSION: No acute fracture or traumatic listhesis of the lumbar spine.   PATIENT SURVEYS:  Modified Oswestry Low Back Pain Disability Questionnaire: 10 / 50 = 20.0 % (Minimal Disability)  Lower Extremity Functional Score: 56 / 80 = 70.0 % (Minimal function disability)    COGNITION: Overall cognitive status: Within functional limits for tasks assessed     SENSATION: WFL- pt stays that N/T is present w/ pain but no loss of sensation present   EDEMA:  Not assessed   MUSCLE LENGTH: Hamstrings: Right Mild-Mod; Left Mild-Mod   IR: L Mod R mod  Piriformis: L Mod R mod   POSTURE: rounded shoulders, forward head, and flexed trunk   PALPATION: TTP: L greater trochanter, L hip flexor group, L glute med, L lumbar paraspinals   LUMBAR ROM:   Active  eval  Flexion WFL  Extension 25% limited; p!, stiffness   Right lateral flexion   Left lateral flexion   Right rotation WFL  Left rotation 25% Limited; stiffness   (Blank rows = not tested)  LOWER EXTREMITY ROM: Grossly [facility] 01/07/24   (Blank rows = not tested)  LOWER EXTREMITY MMT:  MMT Right eval Left eval  Hip flexion 5 4- p!   Hip extension 4+ 4+  Hip abduction 4+ 4  Hip adduction 4+ 5  Hip internal rotation 4+ 4-  Hip external rotation 4+ 4-  Knee flexion 5 4+  Knee extension 5 5  Ankle dorsiflexion 5 5  Ankle plantarflexion    Ankle inversion    Ankle eversion     (Blank rows = not tested)  LUMBAR SPECIAL TESTS:   Slump test: Positive (+)  on L N/T to post calf    FUNCTIONAL TESTS:  Not assessed at eval   GAIT: Distance walked: Clinic Distance  Assistive device utilized: None Level of assistance: Complete Independence Comments: Complete independence. Pt ambulates w/ some fwd flx in the trunk and fwd head  TREATMENT DATE:   02/18/24 THERAPEUTIC EXERCISE: To improve strength, endurance, ROM, and flexibility.  Demonstration, verbal and tactile cues throughout for technique.  Nustep - L 5 x 6 min Standing lumbar extension at counter 10x3 Supine march blue TB 2x10 Supine hip ABD blue TB 2x10  NEUROMUSCULAR RE-EDUCATION: To improve coordination, kinesthesia, posture, and proprioception.  Standing marching x 10 BLE Standing hip abduction x 10 BLE Supine bridge blue TB 2x10  STM to L piriformis, glute med and min  02/04/24 THERAPEUTIC EXERCISE: To improve strength, endurance, ROM, and flexibility.  Demonstration, verbal and tactile cues throughout for technique.  Rec Bike - L 3 x 6 min Standing QL stretch at doorframe x 30 Lumbar extension at wall 10 x 5 - Pt noting increased discomfort in L flank/low back Seated 3-way lumbar flexion stretch 2 x 30 each, 1st set with UE support on green Pball, 2nd set with UE support on SPC Seated HS stretch with foot elevated in chair - deferred as pt flexing excessively in spine Seated hip hinge HS stretch 2 x 30 bil Standing hip flexor stretch with LE elevated on chair behind patient x 30 Supine LTR 10 x 5 Bridge + GTB hip ABD isometric 10 x 5 Hooklying TrA + GTB alt unilateral bent knee fallout 10 x 3 Hooklying TrA + GTB alt unilateral hip flexion march 10 x 3   01/07/24 THERAPEUTIC EXERCISE: To improve strength, endurance, and flexibility.  Demonstration, verbal and tactile cues throughout for technique.  NuStep-  L5x57min  Seated HS stretch 2x30 B (cues to maintain upright posture during)  Hip Flexor stretch on edge of table 2x30 B: needed to walk a few steps between  Standing Quadratus Lumborum Stretch with Doorway 2x30 B  Gastroc Stretch on Wall 2x30  Supine DKTC w/ green Pball x10  Supine LTRs w/ orange Pball x20  Supine Hip Internal and External Rotation x10: pt struggled achieving movement required tactile and verbal cues, states he does not do this one at home  Hooklying Bridge w/ TrA 2x10: reports some soreness  S/L Hip Abd 2x10  Performed 5xSTS: 11.3s  Seated Marches 2x1', 2# B   SELF CARE:  Reviewed initial HEP w/ pt to ensure proper positioning & timing for each exercises.    12/31/2023 SELF CARE:  Reviewed eval findings and role of PT in addressing identified deficits as well as instruction current HEP (see below).    PATIENT EDUCATION:  Education details: HEP review and HEP update  Person educated: Patient Education method: Explanation, Demonstration, Verbal cues, Tactile cues, and Handouts Education comprehension: verbalized understanding, returned demonstration, verbal cues required, tactile cues required, and needs further education    HOME EXERCISE PROGRAM: Access Code: [email or web address] URL: [email or web address] Date: 02/04/2024 Prepared by: Elijah Hidden  Exercises - Gastroc Stretch on Wall  - 1-2 x daily - 7 x weekly - 5 reps - 30 secc hold - Standing Quadratus Lumborum Stretch with Doorway  - 1-2 x daily - 7 x weekly - 1 sets - 3 reps - 30 sec hold - Standing Hip Flexor Stretch on Chair  - 1-2 x daily - 7 x weekly - 1 sets - 3 reps - 30 sec hold - Seated Hamstring Stretch  - 1-2 x daily - 7 x weekly - 3 reps - 30 sec hold - Seated 3 Way Exercise Ball Roll Out Stretch  - 1-2 x daily - 7 x weekly - 2 sets - 3 reps - 30 sec hold - Supine Lower Trunk  Rotation  - 1 x daily - 7 x weekly - 1 sets - 10 reps - 5 sec hold - Supine Bridge with Resistance Band  - 1 x  daily - 7 x weekly - 2-3 sets - 10 reps - 5 sec hold - Hooklying Single Leg Bent Knee Fallouts with Resistance  - 1 x daily - 7 x weekly - 2 sets - 10 reps - 3 sec hold - Supine March with Resistance Band  - 1 x daily - 7 x weekly - 2 sets - 10 reps - 2-3 sec hold hold   ASSESSMENT:  CLINICAL IMPRESSION: Pt reports 40-50% improvement again in overall pain. He notes he will get a THA on L Oct 31st. Guided through exercises provided cues and instruction for form. He was tense in the L posterior hip musculature and noted improvement in mobility after manual. Last visit is next week. He will benefit from continued skilled PT to address ongoing ROM/flexibility and strength deficits to improve mobility and activity tolerance with decreased pain interference.   EVAL: Vicenta PARAS. Lesiak is a 63 y/o M who has been referred for evaluation and treatment for L sided lower back and hip pain that often radiates into L knee, posterior calf, and ankle at times. Pt has has PT for this same issue earlier this year with [facility] and feels as though it helped before so, he has decided to try it again this time. Pt expressed that he feels like he will eventually have to have surgery but, in the mean time he will try therapy. He states that he still has access to his previous HEP and he does some of the exercises sometimes. Pt states that this most recent episode of L hip and back pain came on when he was on vacation ~ July 4th and he woke up one morning to the pain. Pt states that he did not do anything specifically that resulted in the pain. Today's evaluation revealed some remaining deficits in ROM (refer to chart above), muscle tightness in B hamstrings, some tenderness to areas in the L greater trochanter, L glute med. Pt reports difficulty with sitting or standing for extended periods of time. He feels as though walking loosens him up and can help with pain at time. Pt had a positive slump test on the L, N/T radiated into the L  posterior calf during. Bertrand scored a 70% (56/80) on the LEFS and a 20% on the Mod Oswestry for LBP, both indicating minimal functional disability. Pt would benefit from skilled PT to address current deficits to improve his activity tolerance and mobility w/ dec'd pain interference.   OBJECTIVE IMPAIRMENTS: Abnormal gait, decreased activity tolerance, decreased balance, decreased endurance, decreased knowledge of condition, decreased mobility, difficulty walking, decreased ROM, decreased strength, hypomobility, increased fascial restrictions, impaired perceived functional ability, increased muscle spasms, impaired flexibility, impaired sensation, improper body mechanics, postural dysfunction, and pain.   ACTIVITY LIMITATIONS: carrying, lifting, bending, sitting, standing, squatting, sleeping, stairs, bed mobility, continence, locomotion level, and caring for others  PARTICIPATION LIMITATIONS: cleaning, laundry, driving, shopping, community activity, occupation, and yard work  PERSONAL FACTORS: Age, Past/current experiences, Profession, Time since onset of injury/illness/exacerbation, and 3+ comorbidities: Arthritis, Gout, H/O R THA  are also affecting patient's functional outcome.   REHAB POTENTIAL: Good  CLINICAL DECISION MAKING: Evolving/moderate complexity  EVALUATION COMPLEXITY: Moderate   GOALS: Goals reviewed with patient? Yes  SHORT TERM GOALS: Target date: 01/28/2024   Patient will be independent to initial HEP.  Baseline: 12/31/23- initial HEP updated from previous one to focus on more stretching  Goal status: MET - 02/04/24 - minor adjustments made with good return demostration   2.  Patient will report a 25% improvement in low back and L hip pain to improve current activity tolerance.  Baseline: NPRS 6-7/10 Goal status: MET - 02/04/24 -  Pt reports 40-45% improvement in pain   3.  Patient will improve sitting tolerance to 30 minutes w/o increase in pain. Baseline: Pain increases  w/ extended periods of sitting.  Goal status: MET - 02/04/24 - Pt reports he is able to sit for 1.5 hours before having to move around due to pain  4.  Patient will report centralization of his radicular symptoms.  Baseline: Pt's pain radiates into L knee and ankles.  Goal status: IN PROGRESS - 02/18/24- not as bad, comes and goes  LONG TERM GOALS: Target date: 02/25/2024   Patient will be independent w/ advanced/ongoing HEP to improve patient outcomes and carryover.  Baseline:  Goal status: INITIAL  2.  Patient will report at least a 50% improvement in low back and L hip pain to improve QoL.  Baseline:  Goal status: 02/18/24- 40% improvement  3.  Patient will be able to swing a golf club w/o experiencing increases in LBP and L hip pain to participate in personal hobbies.  Baseline: Pt reports he has not swung a golf club and wants to try.  Goal status: INITIAL  4.  Patient will improve his LEFS score by at least 9 points to decrease current difficulties with ADLs.  Baseline: LEFS 58/80 (70%)  Goal status: INITIAL   PLAN:  PT FREQUENCY: 1x/week  PT DURATION: 8 weeks  PLANNED INTERVENTIONS: 02835- PT Re-evaluation, 97750- Physical Performance Testing, 97110-Therapeutic exercises, 97530- Therapeutic activity, 97112- Neuromuscular re-education, 97535- Self Care, 02859- Manual therapy, [phone]- Aquatic Therapy, [phone]- Electrical stimulation (unattended), [id number]- Ultrasound, [id number]- Traction (mechanical), [id number]- Ionotophoresis 4mg /ml Dexamethasone , 79439 (1-2 muscles), 20561 (3+ muscles)- Dry Needling, Patient/Family education, Balance training, Stair training, Taping, Joint mobilization, Spinal mobilization, Cryotherapy, and Moist heat  PLAN FOR NEXT SESSION: most likely discharge, having THA on October 31st; Continue flexibility exercises and strengthening exercises for LE/core - review and update HEP as needed; MT (STM-L glute med, hip flexors) possibly    Romaldo Saville L Dishawn Bhargava, PTA 02/18/2024,  11:04 AM

## 2024-02-25 ENCOUNTER — Ambulatory Visit

## 2024-02-25 DIAGNOSIS — M5416 Radiculopathy, lumbar region: Secondary | ICD-10-CM

## 2024-02-25 DIAGNOSIS — R252 Cramp and spasm: Secondary | ICD-10-CM

## 2024-02-25 DIAGNOSIS — M5459 Other low back pain: Secondary | ICD-10-CM

## 2024-02-25 DIAGNOSIS — M25552 Pain in left hip: Secondary | ICD-10-CM

## 2024-02-25 NOTE — Therapy (Addendum)
 OUTPATIENT PHYSICAL THERAPY TREATMENT / DISCHARGE SUMMARY   Patient Name: Terry Burton MRN: [id number] DOB:07/15/1960, 63 y.o., male Today's Date: 02/25/2024  END OF SESSION:  PT End of Session - 02/25/24 0934     Visit Number 5    Date for Recertification  02/25/24    Authorization Type BC[employer]    PT Start Time [phone]    PT Stop Time 0930    PT Time Calculation (min) 40 min    Activity Tolerance Patient tolerated treatment well    Behavior During Therapy WFL for tasks assessed/performed             Past Medical History:  Diagnosis Date   Arthritis    right hip and back with pain down right leg   Gout    Past Surgical History:  Procedure Laterality Date   FRACTURE SURGERY     repair right elbow years ago- no retained hardware   TOTAL HIP ARTHROPLASTY  05/01/2011   Procedure: TOTAL HIP ARTHROPLASTY ANTERIOR APPROACH;  Surgeon: [name];  Location: [facility];  Service: Orthopedics;  Laterality: Right;   Patient Active Problem List   Diagnosis Date Noted   Baker's cyst of knee, right 10/01/2020   Acute idiopathic gout of left knee 10/01/2020   Pseudogout of right knee 10/01/2020   Chronic pain of right knee 01/26/2017   Acute pain of right shoulder 12/10/2016   Right hip pain 12/10/2016   Hip arthritis 05/01/2011    PCP: Darilyn Rosalva Lonni, PA-C   REFERRING PROVIDER: Poli Lonni CINDERELLA, MD   REFERRING DIAG:  [phone] (ICD-10-CM) - Avascular necrosis of bone of left hip (HCC)  M16.12 (ICD-10-CM) - Unilateral primary osteoarthritis, left hip  M25.552 (ICD-10-CM) - Pain of left hip  M54.42 (ICD-10-CM) - Acute left-sided low back pain with left-sided sciatica   THERAPY DIAG:  Other low back pain  Pain in left hip  Radiculopathy, lumbar region  Cramp and spasm  RATIONALE FOR EVALUATION AND TREATMENT: Rehabilitation  ONSET DATE: ~July 4th  NEXT MD VISIT: None scheduled - possible L THA in late Nov/early Dec   SUBJECTIVE:    SUBJECTIVE STATEMENT: Still reporting the same aches and pains.  EVAL: Pt reports that his previous pain in in L Low back and Hip has returned. He figured PT would help again. This pain returned around July 4th when he was in [facility], he woke up with an achy pain and states it was at a 10/10 at that time. Pain +  N/T  goes down to L knee and ankle often times.    PERTINENT HISTORY: From MD notes  He was involved in an accident where a dock that he was fishing off of collapsed recently. He was eventually seen at [facility] and had x-rays of the lumbar spine and his pelvis and left hip on October 28 of 2024.; Arthritis, Gout, H/O R THA  PAIN:  Are you having pain? Yes: NPRS scale: 3/10 Pain location: L Low back & hip Pain description: soreness  Aggravating factors: Sitting a long time while driving Relieving factors: stretching, walking around, doing exercises    PRECAUTIONS: None  RED FLAGS: None   WEIGHT BEARING RESTRICTIONS: No  FALLS:  Has patient fallen in last 6 months? 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  DIAGNOSTIC FINDINGS:  07/25/2023 - MR OF THE LEFT HIP WITHOUT CONTRAST Bones: No acute fracture. No dislocation. Large geographic area of avascular necrosis of the left femoral head. Small linear site of high signal in the subchondral bone plate at the femoral head apex indicative of subchondral fracture (series 8, image 16). Preservation of the femoral head contour. Prior right total hip arthroplasty with associated metallic  susceptibility artifact. Bony pelvis intact without diastasis. SI joints and pubic symphysis within normal limits. Degenerative disc disease is evident at the L5-S1 level, not well assessed. No bone marrow edema. No marrow replacing bone lesion.  IMPRESSION: 1. Avascular necrosis of the left femoral head with small subchondral fracture at the femoral head apex. Preservation of the femoral head contour without flattening or collapse. 2. Moderate osteoarthritis of the left hip. 3. Superior labral tear  03/29/23 - DG Lumbar spine IMPRESSION: No acute fracture or traumatic listhesis of the lumbar spine.   PATIENT SURVEYS:  Modified Oswestry Low Back Pain Disability Questionnaire: 10 / 50 = 20.0 % (Minimal Disability)  Lower Extremity Functional Score: 56 / 80 = 70.0 % (Minimal function disability)    COGNITION: Overall cognitive status: Within functional limits for tasks assessed     SENSATION: WFL- pt stays that N/T is present w/ pain but no loss of sensation present   EDEMA:  Not assessed   MUSCLE LENGTH: Hamstrings: Right Mild-Mod; Left Mild-Mod   IR: L Mod R mod  Piriformis: L Mod R mod   POSTURE: rounded shoulders, forward head, and flexed trunk   PALPATION: TTP: L greater trochanter, L hip flexor group, L glute med, L lumbar paraspinals   LUMBAR ROM:   Active  eval  Flexion WFL  Extension 25% limited; p!, stiffness   Right lateral flexion   Left lateral flexion   Right rotation WFL  Left rotation 25% Limited; stiffness   (Blank rows = not tested)  LOWER EXTREMITY ROM: Grossly [facility] 01/07/24   (Blank rows = not tested)  LOWER EXTREMITY MMT:  MMT Right eval Left eval  Hip flexion 5 4- p!   Hip extension 4+ 4+  Hip abduction 4+ 4  Hip adduction 4+ 5  Hip internal rotation 4+ 4-  Hip external rotation 4+ 4-  Knee flexion 5 4+  Knee extension 5 5  Ankle dorsiflexion 5 5  Ankle plantarflexion    Ankle inversion    Ankle eversion     (Blank rows = not  tested)  LUMBAR SPECIAL TESTS:  Slump test: Positive (+)  on L N/T to post calf    FUNCTIONAL TESTS:  Not assessed at eval   GAIT: Distance walked: Clinic Distance  Assistive device utilized: None Level of assistance: Complete Independence Comments: Complete independence. Pt ambulates w/ some fwd flx in the trunk and fwd head  TREATMENT DATE:   02/25/24 THERAPEUTIC EXERCISE: To improve strength, endurance, ROM, and flexibility.  Demonstration, verbal and tactile cues throughout for technique.  Bike L2x99min Reassessed goals-- see below under goals L clamshell S/L x 10 L hip abd S/L x 5  NEUROMUSCULAR RE-EDUCATION: To improve coordination, kinesthesia, posture, and proprioception.  Standing 4 way hip strengthening RTB around ankle looped to weight machine x 10 BLE  02/18/24 THERAPEUTIC EXERCISE: To improve strength, endurance, ROM, and flexibility.  Demonstration, verbal and tactile cues throughout for technique.  Nustep - L 5 x 6 min Standing lumbar extension at counter 10x3 Supine march blue TB 2x10 Supine hip ABD blue TB 2x10  NEUROMUSCULAR RE-EDUCATION: To improve coordination, kinesthesia, posture, and proprioception.  Standing marching x 10 BLE Standing hip abduction x 10 BLE Supine bridge blue TB 2x10  STM to L piriformis, glute med and min   02/04/24 THERAPEUTIC EXERCISE: To improve strength, endurance, ROM, and flexibility.  Demonstration, verbal and tactile cues throughout for technique.  Rec Bike - L 3 x 6 min Standing QL stretch at doorframe x 30 Lumbar extension at wall 10 x 5 - Pt noting increased discomfort in L flank/low back Seated 3-way lumbar flexion stretch 2 x 30 each, 1st set with UE support on green Pball, 2nd set with UE support on SPC Seated HS stretch with foot elevated in chair - deferred as pt flexing excessively in  spine Seated hip hinge HS stretch 2 x 30 bil Standing hip flexor stretch with LE elevated on chair behind patient x 30 Supine LTR 10 x 5 Bridge + GTB hip ABD isometric 10 x 5 Hooklying TrA + GTB alt unilateral bent knee fallout 10 x 3 Hooklying TrA + GTB alt unilateral hip flexion march 10 x 3   01/07/24 THERAPEUTIC EXERCISE: To improve strength, endurance, and flexibility.  Demonstration, verbal and tactile cues throughout for technique.  NuStep- L5x65min  Seated HS stretch 2x30 B (cues to maintain upright posture during)  Hip Flexor stretch on edge of table 2x30 B: needed to walk a few steps between  Standing Quadratus Lumborum Stretch with Doorway 2x30 B  Gastroc Stretch on Wall 2x30  Supine DKTC w/ green Pball x10  Supine LTRs w/ orange Pball x20  Supine Hip Internal and External Rotation x10: pt struggled achieving movement required tactile and verbal cues, states he does not do this one at home  Hooklying Bridge w/ TrA 2x10: reports some soreness  S/L Hip Abd 2x10  Performed 5xSTS: 11.3s  Seated Marches 2x1', 2# B   SELF CARE:  Reviewed initial HEP w/ pt to ensure proper positioning & timing for each exercises.    12/31/2023 SELF CARE:  Reviewed eval findings and role of PT in addressing identified deficits as well as instruction current HEP (see below).    PATIENT EDUCATION:  Education details: HEP review and HEP update  Person educated: Patient Education method: Explanation, Demonstration, Verbal cues, Tactile cues, and Handouts Education comprehension: verbalized understanding, returned demonstration, verbal cues required, tactile cues required, and needs further education    HOME EXERCISE PROGRAM: Access Code: [email or web address] URL: [email or web address] Date: 02/25/2024 Prepared by: Taffy Delconte  Exercises - Gastroc Stretch on Wall  - 1-2 x daily - 7 x weekly - 5 reps - 30 secc hold - Standing Quadratus Lumborum Stretch with Doorway  - 1-2 x daily -  7 x weekly - 1 sets - 3 reps - 30 sec hold - Standing Hip Flexor Stretch on Chair  - 1-2  x daily - 7 x weekly - 1 sets - 3 reps - 30 sec hold - Seated Hamstring Stretch  - 1-2 x daily - 7 x weekly - 3 reps - 30 sec hold - Seated 3 Way Exercise Ball Roll Out Stretch  - 1-2 x daily - 7 x weekly - 2 sets - 3 reps - 30 sec hold - Supine Lower Trunk Rotation  - 1 x daily - 7 x weekly - 1 sets - 10 reps - 5 sec hold - Supine Bridge with Resistance Band  - 1 x daily - 7 x weekly - 2-3 sets - 10 reps - 5 sec hold - Hooklying Single Leg Bent Knee Fallouts with Resistance  - 1 x daily - 7 x weekly - 2 sets - 10 reps - 3 sec hold - Supine March with Resistance Band  - 1 x daily - 7 x weekly - 2 sets - 10 reps - 2-3 sec hold hold - Standing Hip Adduction with Anchored Resistance  - 1 x daily - 7 x weekly - 2 sets - 10 reps - Standing Hip Flexion with Anchored Resistance  - 1 x daily - 7 x weekly - 2 sets - 10 reps - Standing Hip Extension with Anchored Resistance  - 1 x daily - 7 x weekly - 2 sets - 10 reps - Standing Hip Abduction with Anchored Resistance  - 1 x daily - 7 x weekly - 2 sets - 10 reps - Clamshell  - 1 x daily - 7 x weekly - 2 sets - 10 reps - 3 sec hold   ASSESSMENT:  CLINICAL IMPRESSION: Goals reassessed, HEP reviewed and updated to prepare for D/C. Pt has had mild improvement in sx with this bout of PT d/t OA and avascular necrosis of L hip. Pt being discharged today d/t only minimal improvement in sx in L hip pain. He reports that he will have a L THA on October 31st.  STG #4 is not met, along with LTG #2 and #4. LTG #3 is partially met with all remaining goals met.  EVAL: Terry Burton is a 63 y/o M who has been referred for evaluation and treatment for L sided lower back and hip pain that often radiates into L knee, posterior calf, and ankle at times. Pt has has PT for this same issue earlier this year with [facility] and feels as though it helped before so, he has decided to try it again  this time. Pt expressed that he feels like he will eventually have to have surgery but, in the mean time he will try therapy. He states that he still has access to his previous HEP and he does some of the exercises sometimes. Pt states that this most recent episode of L hip and back pain came on when he was on vacation ~ July 4th and he woke up one morning to the pain. Pt states that he did not do anything specifically that resulted in the pain. Today's evaluation revealed some remaining deficits in ROM (refer to chart above), muscle tightness in B hamstrings, some tenderness to areas in the L greater trochanter, L glute med. Pt reports difficulty with sitting or standing for extended periods of time. He feels as though walking loosens him up and can help with pain at time. Pt had a positive slump test on the L, N/T radiated into the L posterior calf during. Thom scored a 70% (56/80) on the LEFS and a 20%  on the Mod Oswestry for LBP, both indicating minimal functional disability. Pt would benefit from skilled PT to address current deficits to improve his activity tolerance and mobility w/ dec'd pain interference.   OBJECTIVE IMPAIRMENTS: Abnormal gait, decreased activity tolerance, decreased balance, decreased endurance, decreased knowledge of condition, decreased mobility, difficulty walking, decreased ROM, decreased strength, hypomobility, increased fascial restrictions, impaired perceived functional ability, increased muscle spasms, impaired flexibility, impaired sensation, improper body mechanics, postural dysfunction, and pain.   ACTIVITY LIMITATIONS: carrying, lifting, bending, sitting, standing, squatting, sleeping, stairs, bed mobility, continence, locomotion level, and caring for others  PARTICIPATION LIMITATIONS: cleaning, laundry, driving, shopping, community activity, occupation, and yard work  PERSONAL FACTORS: Age, Past/current experiences, Profession, Time since onset of  injury/illness/exacerbation, and 3+ comorbidities: Arthritis, Gout, H/O R THA  are also affecting patient's functional outcome.   REHAB POTENTIAL: Good  CLINICAL DECISION MAKING: Evolving/moderate complexity  EVALUATION COMPLEXITY: Moderate   GOALS: Goals reviewed with patient? Yes  SHORT TERM GOALS: Target date: 01/28/2024   Patient will be independent to initial HEP.  Baseline: 12/31/23- initial HEP updated from previous one to focus on more stretching  Goal status: MET - 02/04/24 - minor adjustments made with good return demostration   2.  Patient will report a 25% improvement in low back and L hip pain to improve current activity tolerance.  Baseline: NPRS 6-7/10 Goal status: MET - 02/04/24 -  Pt reports 40-45% improvement in pain   3.  Patient will improve sitting tolerance to 30 minutes w/o increase in pain. Baseline: Pain increases w/ extended periods of sitting.  Goal status: MET - 02/04/24 - Pt reports he is able to sit for 1.5 hours before having to move around due to pain  4.  Patient will report centralization of his radicular symptoms.  Baseline: Pt's pain radiates into L knee and ankles.  Goal status: NOT MET - 02/25/24- it comes and goes every other day  LONG TERM GOALS: Target date: 02/25/2024   Patient will be independent w/ advanced/ongoing HEP to improve patient outcomes and carryover.  Baseline:  Goal status: MET - 02/25/24  2.  Patient will report at least a 50% improvement in low back and L hip pain to improve QoL.  Baseline:  Goal status: NOT MET - 02/18/24 - 40% improvement  3.  Patient will be able to swing a golf club w/o experiencing increases in LBP and L hip pain to participate in personal hobbies.  Baseline: Pt reports he has not swung a golf club and wants to try.  Goal status: PARTIALLY MET - 02/25/24 - able to swing golf club but with pain  4.  Patient will improve his LEFS score by at least 9 points to decrease current difficulties with ADLs.   Baseline: LEFS 58/80 (70%)  Goal status: NOT MET - 02/25/24 - 31 / 80 = 38.8 %   PLAN:  PT FREQUENCY: 1x/week  PT DURATION: 8 weeks  PLANNED INTERVENTIONS: 97164- PT Re-evaluation, 97750- Physical Performance Testing, 97110-Therapeutic exercises, 97530- Therapeutic activity, 97112- Neuromuscular re-education, 97535- Self Care, 02859- Manual therapy, [id number]- Aquatic Therapy, H9716- Electrical stimulation (unattended), [id number]- Ultrasound, [id number]- Traction (mechanical), [id number]- Ionotophoresis 4mg /ml Dexamethasone , 79439 (1-2 muscles), 20561 (3+ muscles)- Dry Needling, Patient/Family education, Balance training, Stair training, Taping, Joint mobilization, Spinal mobilization, Cryotherapy, and Moist heat  PLAN FOR NEXT SESSION: discharge from PT   Yvon Mccord L Matthew Pais, PTA 02/25/2024, 11:30 AM    PHYSICAL THERAPY DISCHARGE SUMMARY  Visits from Start of Care: 5  Current functional  level related to goals / functional outcomes: Refer to above clinical impression and goal assessment.    Remaining deficits: Ongoing L hip pain - patient scheduled for L THA on 03/31/2024   Education / Equipment: HEP  Patient agrees to discharge. Patient goals were partially met. Patient is being discharged due to minimal change in L hip pain with patient scheduled for L THA on 03/31/2024.  Elijah EMERSON Hidden, PT 02/28/2024, 8:12 PM  [facility] [address]  [address] [place], [place], [address] Phone: [phone]   Fax:  [phone]

## 2024-03-10 ENCOUNTER — Telehealth: Payer: Self-pay | Admitting: Orthopaedic Surgery

## 2024-03-10 NOTE — Telephone Encounter (Signed)
 Forms completed. Tried calling pt to advise, na, voicemail not set up. I sent patient mychart message advising forms are ready to pick up.

## 2024-03-10 NOTE — Telephone Encounter (Signed)
 Pt submitted medical release form, short term forms, and payment $20.00

## 2024-03-16 ENCOUNTER — Other Ambulatory Visit: Payer: Self-pay | Admitting: Physician Assistant

## 2024-03-16 DIAGNOSIS — Z01818 Encounter for other preprocedural examination: Secondary | ICD-10-CM

## 2024-03-20 ENCOUNTER — Encounter (HOSPITAL_COMMUNITY): Payer: Self-pay

## 2024-03-20 NOTE — Patient Instructions (Addendum)
 SURGICAL WAITING ROOM VISITATION Patients having surgery or a procedure may have no more than 2 support people in the waiting area - these visitors may rotate.    Children under the age of 27 must have an adult with them who is not the patient.  If the patient needs to stay at the hospital during part of their recovery, the visitor guidelines for inpatient rooms apply. Pre-op nurse will coordinate an appropriate time for 1 support person to accompany patient in pre-op.  This support person may not rotate.    Please refer to the [facility] website for the visitor guidelines for Inpatients (after your surgery is over and you are in a regular room).       Your procedure is scheduled on: 03-31-24   Report to [facility] Main Entrance    Report to admitting at 5:15 AM   Call this number if you have problems the morning of surgery [phone]   Do not eat food :After Midnight.   After Midnight you may have the following liquids until 4:15 AM DAY OF SURGERY  Water Non-Citrus Juices (without pulp, NO RED-Apple, White grape, White cranberry) Black Coffee (NO MILK/CREAM OR CREAMERS, sugar ok)  Clear Tea (NO MILK/CREAM OR CREAMERS, sugar ok) regular and decaf                             Plain Jell-O (NO RED)                                           Fruit ices (not with fruit pulp, NO RED)                                     Popsicles (NO RED)                                                               Sports drinks like Gatorade (NO RED)                   The day of surgery:  Drink ONE (1) Pre-Surgery Clear Ensure by 4:15 AM the morning of surgery. Drink in one sitting. Do not sip.  This drink was given to you during your hospital  pre-op appointment visit. Nothing else to drink after completing the Pre-Surgery Clear Ensure .          If you have questions, please contact your surgeon's office.   FOLLOW ANY ADDITIONAL PRE OP INSTRUCTIONS YOU RECEIVED FROM YOUR SURGEON'S  OFFICE!!!     Oral Hygiene is also important to reduce your risk of infection.                                    Remember - BRUSH YOUR TEETH THE MORNING OF SURGERY WITH YOUR REGULAR TOOTHPASTE   Do NOT smoke after Midnight   Take these medicines the morning of surgery with A SIP OF WATER:    Allopurinol   Amlodipine  Colchicine  if needed   Stop all vitamins and herbal supplements 7 days before surgery  Bring CPAP mask and tubing day of surgery.                              You may not have any metal on your body including  jewelry, and body piercing             Do not wear lotions, powders, cologne, or deodorant              Men may shave face and neck.   Do not bring valuables to the hospital. [facility] IS NOT RESPONSIBLE   FOR VALUABLES.   Contacts, dentures or bridgework may not be worn into surgery.   Bring small overnight bag day of surgery.   DO NOT BRING YOUR HOME MEDICATIONS TO THE HOSPITAL. PHARMACY WILL DISPENSE MEDICATIONS LISTED ON YOUR MEDICATION LIST TO YOU DURING YOUR ADMISSION IN THE HOSPITAL!   Special Instructions: Bring a copy of your healthcare power of attorney and living will documents the day of surgery if you haven't scanned them before.              Please read over the following fact sheets you were given: IF YOU HAVE QUESTIONS ABOUT YOUR PRE-OP INSTRUCTIONS PLEASE CALL [phone] Gwen  If you received a COVID test during your pre-op visit  it is requested that you wear a mask when out in public, stay away from anyone that may not be feeling well and notify your surgeon if you develop symptoms. If you test positive for Covid or have been in contact with anyone that has tested positive in the last 10 days please notify you surgeon.    Pre-operative 4 CHG Bath Instructions  DYNA-Hex 4 Chlorhexidine Gluconate 4% Solution Antiseptic 4 fl. oz   You can play a key role in reducing the risk of infection after surgery. Your skin needs to be as free  of germs as possible. You can reduce the number of germs on your skin by washing with CHG (chlorhexidine gluconate) soap before surgery. CHG is an antiseptic soap that kills germs and continues to kill germs even after washing.   DO NOT use if you have an allergy to chlorhexidine/CHG or antibacterial soaps. If your skin becomes reddened or irritated, stop using the CHG and notify one of our RNs at   Please shower with the CHG soap starting 4 days before surgery using the following schedule:     Please keep in mind the following:  DO NOT shave, including legs and underarms, starting the day of your first shower.   You may shave your face at any point before/day of surgery.  Place clean sheets on your bed the day you start using CHG soap. Use a clean washcloth (not used since being washed) for each shower. DO NOT sleep with pets once you start using the CHG.  CHG Shower Instructions:  If you choose to wash your hair and private area, wash first with your normal shampoo/soap.  After you use shampoo/soap, rinse your hair and body thoroughly to remove shampoo/soap residue.  Turn the water OFF and apply about 3 tablespoons (45 ml) of CHG soap to a CLEAN washcloth.  Apply CHG soap ONLY FROM YOUR NECK DOWN TO YOUR TOES (washing for 3-5 minutes)  DO NOT use CHG soap on face, private areas, open wounds, or sores.  Pay special attention  to the area where your surgery is being performed.  If you are having back surgery, having someone wash your back for you may be helpful. Wait 2 minutes after CHG soap is applied, then you may rinse off the CHG soap.  Pat dry with a clean towel  Put on clean clothes/pajamas   If you choose to wear lotion, please use ONLY the CHG-compatible lotions on the back of this paper.     Additional instructions for the day of surgery: DO NOT APPLY any lotions, deodorants, cologne, or perfumes.   Put on clean/comfortable clothes.  Brush your teeth.  Ask your nurse before  applying any prescription medications to the skin.   CHG Compatible Lotions   Aveeno Moisturizing lotion  Cetaphil Moisturizing Cream  Cetaphil Moisturizing Lotion  Clairol Herbal Essence Moisturizing Lotion, Dry Skin  Clairol Herbal Essence Moisturizing Lotion, Extra Dry Skin  Clairol Herbal Essence Moisturizing Lotion, Normal Skin  Curel Age Defying Therapeutic Moisturizing Lotion with Alpha Hydroxy  Curel Extreme Care Body Lotion  Curel Soothing Hands Moisturizing Hand Lotion  Curel Therapeutic Moisturizing Cream, Fragrance-Free  Curel Therapeutic Moisturizing Lotion, Fragrance-Free  Curel Therapeutic Moisturizing Lotion, Original Formula  Eucerin Daily Replenishing Lotion  Eucerin Dry Skin Therapy Plus Alpha Hydroxy Crme  Eucerin Dry Skin Therapy Plus Alpha Hydroxy Lotion  Eucerin Original Crme  Eucerin Original Lotion  Eucerin Plus Crme Eucerin Plus Lotion  Eucerin TriLipid Replenishing Lotion  Keri Anti-Bacterial Hand Lotion  Keri Deep Conditioning Original Lotion Dry Skin Formula Softly Scented  Keri Deep Conditioning Original Lotion, Fragrance Free Sensitive Skin Formula  Keri Lotion Fast Absorbing Fragrance Free Sensitive Skin Formula  Keri Lotion Fast Absorbing Softly Scented Dry Skin Formula  Keri Original Lotion  Keri Skin Renewal Lotion Keri Silky Smooth Lotion  Keri Silky Smooth Sensitive Skin Lotion  Nivea Body Creamy Conditioning Oil  Nivea Body Extra Enriched Lotion  Nivea Body Original Lotion  Nivea Body Sheer Moisturizing Lotion Nivea Crme  Nivea Skin Firming Lotion  NutraDerm 30 Skin Lotion  NutraDerm Skin Lotion  NutraDerm Therapeutic Skin Cream  NutraDerm Therapeutic Skin Lotion  ProShield Protective Hand Cream  Provon moisturizing lotion   PATIENT SIGNATURE_________________________________  NURSE SIGNATURE__________________________________  ________________________________________________________________________    Terry Burton  An incentive spirometer is a tool that can help keep your lungs clear and active. This tool measures how well you are filling your lungs with each breath. Taking long deep breaths may help reverse or decrease the chance of developing breathing (pulmonary) problems (especially infection) following: A long period of time when you are unable to move or be active. BEFORE THE PROCEDURE  If the spirometer includes an indicator to show your best effort, your nurse or respiratory therapist will set it to a desired goal. If possible, sit up straight or lean slightly forward. Try not to slouch. Hold the incentive spirometer in an upright position. INSTRUCTIONS FOR USE  Sit on the edge of your bed if possible, or sit up as far as you can in bed or on a chair. Hold the incentive spirometer in an upright position. Breathe out normally. Place the mouthpiece in your mouth and seal your lips tightly around it. Breathe in slowly and as deeply as possible, raising the piston or the ball toward the top of the column. Hold your breath for 3-5 seconds or for as long as possible. Allow the piston or ball to fall to the bottom of the column. Remove the mouthpiece from your mouth and breathe out  normally. Rest for a few seconds and repeat Steps 1 through 7 at least 10 times every 1-2 hours when you are awake. Take your time and take a few normal breaths between deep breaths. The spirometer may include an indicator to show your best effort. Use the indicator as a goal to work toward during each repetition. After each set of 10 deep breaths, practice coughing to be sure your lungs are clear. If you have an incision (the cut made at the time of surgery), support your incision when coughing by placing a pillow or rolled up towels firmly against it. Once you are able to get out of bed, walk around indoors and cough well. You may stop using the incentive spirometer when instructed by your caregiver.  RISKS AND  COMPLICATIONS Take your time so you do not get dizzy or light-headed. If you are in pain, you may need to take or ask for pain medication before doing incentive spirometry. It is harder to take a deep breath if you are having pain. AFTER USE Rest and breathe slowly and easily. It can be helpful to keep track of a log of your progress. Your caregiver can provide you with a simple table to help with this. If you are using the spirometer at home, follow these instructions: SEEK MEDICAL CARE IF:  You are having difficultly using the spirometer. You have trouble using the spirometer as often as instructed. Your pain medication is not giving enough relief while using the spirometer. You develop fever of 100.5 F (38.1 C) or higher. SEEK IMMEDIATE MEDICAL CARE IF:  You cough up bloody sputum that had not been present before. You develop fever of 102 F (38.9 C) or greater. You develop worsening pain at or near the incision site. MAKE SURE YOU:  Understand these instructions. Will watch your condition. Will get help right away if you are not doing well or get worse. Document Released: 09/28/2006 Document Revised: 08/10/2011 Document Reviewed: 11/29/2006 Exit[employer] Patient Information 2014 [employer], [place].   ________________________________________________________________________ WHAT IS A BLOOD TRANSFUSION? Blood Transfusion Information  A transfusion is the replacement of blood or some of its parts. Blood is made up of multiple cells which provide different functions. Red blood cells carry oxygen and are used for blood loss replacement. White blood cells fight against infection. Platelets control bleeding. Plasma helps clot blood. Other blood products are available for specialized needs, such as hemophilia or other clotting disorders. BEFORE THE TRANSFUSION  Who gives blood for transfusions?  Healthy volunteers who are fully evaluated to make sure their blood is safe. This is blood bank  blood. Transfusion therapy is the safest it has ever been in the practice of medicine. Before blood is taken from a donor, a complete history is taken to make sure that person has no history of diseases nor engages in risky social behavior (examples are intravenous drug use or sexual activity with multiple partners). The donor's travel history is screened to minimize risk of transmitting infections, such as malaria. The donated blood is tested for signs of infectious diseases, such as HIV and hepatitis. The blood is then tested to be sure it is compatible with you in order to minimize the chance of a transfusion reaction. If you or a relative donates blood, this is often done in anticipation of surgery and is not appropriate for emergency situations. It takes many days to process the donated blood. RISKS AND COMPLICATIONS Although transfusion therapy is very safe and saves many lives, the main  dangers of transfusion include:  Getting an infectious disease. Developing a transfusion reaction. This is an allergic reaction to something in the blood you were given. Every precaution is taken to prevent this. The decision to have a blood transfusion has been considered carefully by your caregiver before blood is given. Blood is not given unless the benefits outweigh the risks. AFTER THE TRANSFUSION Right after receiving a blood transfusion, you will usually feel much better and more energetic. This is especially true if your red blood cells have gotten low (anemic). The transfusion raises the level of the red blood cells which carry oxygen, and this usually causes an energy increase. The nurse administering the transfusion will monitor you carefully for complications. HOME CARE INSTRUCTIONS  No special instructions are needed after a transfusion. You may find your energy is better. Speak with your caregiver about any limitations on activity for underlying diseases you may have. SEEK MEDICAL CARE IF:  Your  condition is not improving after your transfusion. You develop redness or irritation at the intravenous (IV) site. SEEK IMMEDIATE MEDICAL CARE IF:  Any of the following symptoms occur over the next 12 hours: Shaking chills. You have a temperature by mouth above 102 F (38.9 C), not controlled by medicine. Chest, back, or muscle pain. People around you feel you are not acting correctly or are confused. Shortness of breath or difficulty breathing. Dizziness and fainting. You get a rash or develop hives. You have a decrease in urine output. Your urine turns a dark color or changes to pink, red, or brown. Any of the following symptoms occur over the next 10 days: You have a temperature by mouth above 102 F (38.9 C), not controlled by medicine. Shortness of breath. Weakness after normal activity. The white part of the eye turns yellow (jaundice). You have a decrease in the amount of urine or are urinating less often. Your urine turns a dark color or changes to pink, red, or brown. Document Released: 05/15/2000 Document Revised: 08/10/2011 Document Reviewed: 01/02/2008 [facility] Patient Information 2014 [place], [place].  _______________________________________________________________________

## 2024-03-21 ENCOUNTER — Other Ambulatory Visit: Payer: Self-pay

## 2024-03-21 ENCOUNTER — Encounter (HOSPITAL_COMMUNITY): Payer: Self-pay

## 2024-03-21 ENCOUNTER — Encounter (HOSPITAL_COMMUNITY)
Admission: RE | Admit: 2024-03-21 | Discharge: 2024-03-21 | Disposition: A | Discharge status: Home / Self Care | Source: Ambulatory Visit | Attending: Orthopaedic Surgery | Admitting: Orthopaedic Surgery

## 2024-03-21 VITALS — BP 157/90 | HR 64 | Temp 98.2°F | Resp 16 | Ht 72.0 in | Wt 196.8 lb

## 2024-03-21 DIAGNOSIS — Z01818 Encounter for other preprocedural examination: Secondary | ICD-10-CM | POA: Diagnosis present

## 2024-03-21 DIAGNOSIS — R9431 Abnormal electrocardiogram [ECG] [EKG]: Secondary | ICD-10-CM | POA: Diagnosis not present

## 2024-03-21 DIAGNOSIS — I10 Essential (primary) hypertension: Secondary | ICD-10-CM | POA: Insufficient documentation

## 2024-03-21 HISTORY — DX: Essential (primary) hypertension: I10

## 2024-03-21 HISTORY — DX: Anemia, unspecified: D64.9

## 2024-03-21 HISTORY — DX: Nausea with vomiting, unspecified: Z98.890

## 2024-03-21 HISTORY — DX: Nausea with vomiting, unspecified: R11.2

## 2024-03-21 LAB — BASIC METABOLIC PANEL WITH GFR
Anion gap: 10 (ref 5–15)
BUN: 15 mg/dL (ref 8–23)
CO2: 27 mmol/L (ref 22–32)
Calcium: 9.2 mg/dL (ref 8.9–10.3)
Chloride: 102 mmol/L (ref 98–111)
Creatinine, Ser: 0.94 mg/dL (ref 0.61–1.24)
GFR, Estimated: >60 mL/min (ref >60)
Glucose, Bld: 104 mg/dL — ABNORMAL HIGH (ref 70–99)
Potassium: 3.9 mmol/L (ref 3.5–5.1)
Sodium: 139 mmol/L (ref 135–145)

## 2024-03-21 LAB — CBC
HCT: 44.5 % (ref 39.0–52.0)
Hemoglobin: 14.1 g/dL (ref 13.0–17.0)
MCH: 29.3 pg (ref 26.0–34.0)
MCHC: 31.7 g/dL (ref 30.0–36.0)
MCV: 92.3 fL (ref 80.0–100.0)
Platelets: 197 K/uL (ref 150–400)
RBC: 4.82 MIL/uL (ref 4.22–5.81)
RDW: 13.2 % (ref 11.5–15.5)
WBC: 6.4 K/uL (ref 4.0–10.5)
nRBC: 0 % (ref 0.0–0.2)

## 2024-03-21 LAB — SURGICAL PCR SCREEN
MRSA, PCR: NEGATIVE
Staphylococcus aureus: NEGATIVE

## 2024-03-21 NOTE — Progress Notes (Signed)
  Date of COVID positive in last 90 days:  No  PCP - Rosalva Catchings, PA-C Cardiologist - N/A  Chest x-ray - N/A EKG - 03-21-24 Epic Stress Test - N/A ECHO - N/A Cardiac Cath - N/A Pacemaker/ICD device last checked:N/A Spinal Cord Stimulator:N/A  Bowel Prep - N/A  Sleep Study - N/A CPAP -   Fasting Blood Sugar - N/A Checks Blood Sugar _____ times a day  Last dose of GLP1 agonist-  N/A GLP1 instructions:  Do not take after     Last dose of SGLT-2 inhibitors-  N/A SGLT-2 instructions:  Do not take after    Blood Thinner Instructions: N/A Last dose:   Time: Aspirin  Instructions:N/A Last Dose:  Activity level:  Can go up a flight of stairs and perform activities of daily living without stopping and without symptoms of chest pain or shortness of breath.  Anesthesia review: N/A  Patient denies shortness of breath, fever, cough and chest pain at PAT appointment  Patient verbalized understanding of instructions that were given to them at the PAT appointment. Patient was also instructed that they will need to review over the PAT instructions again at home before surgery.

## 2024-03-30 DIAGNOSIS — M1612 Unilateral primary osteoarthritis, left hip: Principal | ICD-10-CM | POA: Insufficient documentation

## 2024-03-30 NOTE — H&P (Signed)
 TOTAL HIP ADMISSION H&P  Patient is admitted for left total hip arthroplasty.  Subjective:  Chief Complaint: left hip pain  HPI: Terry Burton, 63 y.o. male, with left hip pain and well-documented arthritis in his left hip.  It had been stable for a while until he had a hard mechanical fall off of a deck that he was on the collapsed.  He has had an acceleration of his pain since then and even a MRI of the hip showed his osteoarthritis but also an area of avascular necrosis that likely can be contributed to his more recent trauma.  At this point his left hip pain has gotten significantly worse to the point that it is detrimentally affecting his mobility, his quality of life and his actives of daily living to the point he wishes to proceed with a hip replacement on the left side.  He has a remote history of a right hip being replaced.  Patient Active Problem List   Diagnosis Date Noted   Unilateral primary osteoarthritis, left hip 03/30/2024   Baker's cyst of knee, right 10/01/2020   Acute idiopathic gout of left knee 10/01/2020   Pseudogout of right knee 10/01/2020   Chronic pain of right knee 01/26/2017   Acute pain of right shoulder 12/10/2016   Right hip pain 12/10/2016   Hip arthritis 05/01/2011   Past Medical History:  Diagnosis Date   Anemia    As a child   Arthritis    right hip and back with pain down right leg   Gout    Hypertension    PONV (postoperative nausea and vomiting)     Past Surgical History:  Procedure Laterality Date   COLONOSCOPY     FRACTURE SURGERY     repair right elbow years ago- no retained hardware   TOTAL HIP ARTHROPLASTY  05/01/2011   Procedure: TOTAL HIP ARTHROPLASTY ANTERIOR APPROACH;  Surgeon: [name];  Location: [facility];  Service: Orthopedics;  Laterality: Right;    No current facility-administered medications for this encounter.   Current Outpatient Medications  Medication Sig Dispense Refill Last Dose/Taking   allopurinol  (ZYLOPRIM) 300 MG tablet Take 300 mg by mouth 2 (two) times a week.   Taking   amLODipine (NORVASC) 2.5 MG tablet Take 2.5 mg by mouth daily.   Taking   BLACK CURRANT SEED OIL PO Take 1 capsule by mouth daily.   Taking   colchicine  0.6 MG tablet Take 1 tablet (0.6 mg total) by mouth daily. Take 2 pills first dose and then 1 pill 1 hour later.  You can continue 1 pill daily for 7 to 10 days until improved. (Patient taking differently: Take 0.6 mg by mouth daily as needed (gout flare).) 30 tablet 1 Taking Differently   cyclobenzaprine  (FLEXERIL ) 10 MG tablet Take 1 tablet (10 mg total) by mouth 2 (two) times daily as needed for muscle spasms. 20 tablet 1 Taking As Needed   ibuprofen  (ADVIL ) 800 MG tablet Take 1 tablet (800 mg total) by mouth every 8 (eight) hours as needed. 60 tablet 1 Taking As Needed   indomethacin  (INDOCIN ) 50 MG capsule Take 50 mg by mouth daily as needed for moderate pain (pain score 4-6).   Taking As Needed   benzonatate  (TESSALON ) 100 MG capsule Take 1 capsule (100 mg total) by mouth every 8 (eight) hours. (Patient not taking: Reported on 03/17/2024) 21 capsule 0 Not Taking   lidocaine  (LIDODERM ) 5 % Place 1 patch onto the skin daily.  Remove & Discard patch within 12 hours or as directed by MD (Patient not taking: Reported on 03/17/2024) 30 patch 0 Not Taking   methylPREDNISolone  (MEDROL  DOSEPAK) 4 MG TBPK tablet Follow package insert (Patient not taking: No sig reported) 21 each 0    oxyCODONE -acetaminophen  (PERCOCET/ROXICET) 5-325 MG tablet Take 1-2 tablets by mouth every 8 (eight) hours as needed for severe pain (pain score 7-10). (Patient not taking: Reported on 03/17/2024) 8 tablet 0 Not Taking   No Active Allergies  Social History   Tobacco Use   Smoking status: Former    Current packs/day: 0.00    Average packs/day: 2.0 packs/day for 10.0 years (20.0 ttl pk-yrs)    Types: Cigars, Cigarettes    Start date: 04/27/1988    Quit date: 04/27/1998    Years since quitting:  25.9   Smokeless tobacco: Never  Substance Use Topics   Alcohol use: Yes    Alcohol/week: 6.0 standard drinks of alcohol    Types: 6 Cans of beer per week    Comment: occ    Family History  Problem Relation Age of Onset   Cancer Father      Review of Systems  Objective:  Physical Exam Vitals reviewed.  Constitutional:      Appearance: Normal appearance. He is normal weight.  HENT:     Head: Normocephalic and atraumatic.  Eyes:     Extraocular Movements: Extraocular movements intact.     Pupils: Pupils are equal, round, and reactive to light.  Cardiovascular:     Rate and Rhythm: Normal rate and regular rhythm.  Pulmonary:     Effort: Pulmonary effort is normal.     Breath sounds: Normal breath sounds.  Abdominal:     Palpations: Abdomen is soft.  Musculoskeletal:     Cervical back: Normal range of motion and neck supple.     Left hip: Tenderness and bony tenderness present. Decreased range of motion. Decreased strength.  Neurological:     Mental Status: He is alert and oriented to person, place, and time.  Psychiatric:        Behavior: Behavior normal.     Vital signs in last 24 hours:    Labs:   Estimated body mass index is 26.69 kg/m as calculated from the following:   Height as of 03/21/24: 6' (1.829 m).   Weight as of 03/21/24: 89.3 kg.   Imaging Review Plain radiographs demonstrate severe degenerative joint disease of the left hip(s). The bone quality appears to be excellent for age and reported activity level.      Assessment/Plan:  End stage arthritis, left hip(s)  The patient history, physical examination, clinical judgement of the provider and imaging studies are consistent with end stage degenerative joint disease of the left hip(s) and total hip arthroplasty is deemed medically necessary. The treatment options including medical management, injection therapy, arthroscopy and arthroplasty were discussed at length. The risks and benefits of  total hip arthroplasty were presented and reviewed. The risks due to aseptic loosening, infection, stiffness, dislocation/subluxation,  thromboembolic complications and other imponderables were discussed.  The patient acknowledged the explanation, agreed to proceed with the plan and consent was signed. Patient is being admitted for inpatient treatment for surgery, pain control, PT, OT, prophylactic antibiotics, VTE prophylaxis, progressive ambulation and ADL's and discharge planning.The patient is planning to be discharged home with home health services

## 2024-03-31 ENCOUNTER — Observation Stay (HOSPITAL_COMMUNITY)

## 2024-03-31 ENCOUNTER — Other Ambulatory Visit: Payer: Self-pay

## 2024-03-31 ENCOUNTER — Ambulatory Visit (HOSPITAL_COMMUNITY): Payer: Self-pay | Admitting: Certified Registered"

## 2024-03-31 ENCOUNTER — Ambulatory Visit (HOSPITAL_COMMUNITY)

## 2024-03-31 ENCOUNTER — Encounter (HOSPITAL_COMMUNITY)
Admission: RE | Disposition: A | Discharge status: Home / Self Care | Payer: Self-pay | Source: Ambulatory Visit | Attending: Orthopaedic Surgery

## 2024-03-31 ENCOUNTER — Encounter (HOSPITAL_COMMUNITY): Payer: Self-pay | Admitting: Orthopaedic Surgery

## 2024-03-31 ENCOUNTER — Observation Stay (HOSPITAL_COMMUNITY)
Admission: RE | Admit: 2024-03-31 | Discharge: 2024-04-01 | Disposition: A | Discharge status: Home / Self Care | Source: Ambulatory Visit | Attending: Orthopaedic Surgery | Admitting: Orthopaedic Surgery

## 2024-03-31 DIAGNOSIS — Z79899 Other long term (current) drug therapy: Secondary | ICD-10-CM | POA: Insufficient documentation

## 2024-03-31 DIAGNOSIS — Z7982 Long term (current) use of aspirin: Secondary | ICD-10-CM | POA: Diagnosis not present

## 2024-03-31 DIAGNOSIS — Z87891 Personal history of nicotine dependence: Secondary | ICD-10-CM | POA: Diagnosis not present

## 2024-03-31 DIAGNOSIS — M1612 Unilateral primary osteoarthritis, left hip: Principal | ICD-10-CM | POA: Insufficient documentation

## 2024-03-31 DIAGNOSIS — Z96642 Presence of left artificial hip joint: Secondary | ICD-10-CM

## 2024-03-31 DIAGNOSIS — Z96641 Presence of right artificial hip joint: Secondary | ICD-10-CM | POA: Insufficient documentation

## 2024-03-31 DIAGNOSIS — M87852 Other osteonecrosis, left femur: Secondary | ICD-10-CM | POA: Insufficient documentation

## 2024-03-31 DIAGNOSIS — I10 Essential (primary) hypertension: Secondary | ICD-10-CM | POA: Diagnosis not present

## 2024-03-31 DIAGNOSIS — M25552 Pain in left hip: Secondary | ICD-10-CM | POA: Diagnosis present

## 2024-03-31 HISTORY — PX: TOTAL HIP ARTHROPLASTY: SHX124

## 2024-03-31 SURGERY — ARTHROPLASTY, HIP, TOTAL, ANTERIOR APPROACH
Anesthesia: Spinal | Site: Hip | Laterality: Left

## 2024-03-31 MED ORDER — ASPIRIN 81 MG PO CHEW
81.0000 mg | CHEWABLE_TABLET | Freq: Two times a day (BID) | ORAL | Status: DC
Start: 2024-03-31 — End: 2024-04-01
  Administered 2024-03-31 – 2024-04-01 (×2): 81 mg via ORAL
  Filled 2024-03-31 (×2): qty 1

## 2024-03-31 MED ORDER — BUPIVACAINE IN DEXTROSE 0.75-8.25 % IT SOLN
INTRATHECAL | Status: DC | PRN
  Administered 2024-03-31: 1.8 mL via INTRATHECAL

## 2024-03-31 MED ORDER — SODIUM CHLORIDE 0.9 % IV SOLN: INTRAVENOUS | Status: DC

## 2024-03-31 MED ORDER — OXYCODONE HCL 5 MG/5ML PO SOLN: 5.0000 mg | Freq: Once | ORAL | Status: DC | PRN

## 2024-03-31 MED ORDER — FENTANYL CITRATE (PF) 100 MCG/2ML IJ SOLN
INTRAMUSCULAR | Status: DC | PRN
  Administered 2024-03-31: 50 ug via INTRAVENOUS

## 2024-03-31 MED ORDER — SODIUM CHLORIDE 0.9 % IR SOLN
Status: DC | PRN
  Administered 2024-03-31: 1000 mL

## 2024-03-31 MED ORDER — ACETAMINOPHEN 500 MG PO TABS
1000.0000 mg | ORAL_TABLET | Freq: Once | ORAL | Status: AC
  Administered 2024-03-31: 1000 mg via ORAL
  Filled 2024-03-31: qty 2

## 2024-03-31 MED ORDER — ONDANSETRON HCL 4 MG/2ML IJ SOLN
4.0000 mg | Freq: Four times a day (QID) | INTRAMUSCULAR | Status: DC | PRN
  Administered 2024-03-31: 4 mg via INTRAVENOUS
  Filled 2024-03-31: qty 2

## 2024-03-31 MED ORDER — PROPOFOL 10 MG/ML IV BOLUS
INTRAVENOUS | Status: AC
  Filled 2024-03-31: qty 20

## 2024-03-31 MED ORDER — ACETAMINOPHEN 325 MG PO TABS: 325.0000 mg | ORAL_TABLET | Freq: Four times a day (QID) | ORAL | Status: DC | PRN

## 2024-03-31 MED ORDER — PROPOFOL 10 MG/ML IV BOLUS
INTRAVENOUS | Status: DC | PRN
  Administered 2024-03-31: 30 mg via INTRAVENOUS

## 2024-03-31 MED ORDER — DIPHENHYDRAMINE HCL 12.5 MG/5ML PO ELIX: 12.5000 mg | ORAL_SOLUTION | ORAL | Status: DC | PRN

## 2024-03-31 MED ORDER — PROPOFOL 500 MG/50ML IV EMUL
INTRAVENOUS | Status: DC | PRN
  Administered 2024-03-31: 120 ug/kg/min via INTRAVENOUS

## 2024-03-31 MED ORDER — CEFAZOLIN SODIUM-DEXTROSE 2-4 GM/100ML-% IV SOLN
2.0000 g | INTRAVENOUS | Status: AC
  Administered 2024-03-31: 2 g via INTRAVENOUS
  Filled 2024-03-31: qty 100

## 2024-03-31 MED ORDER — 0.9 % SODIUM CHLORIDE (POUR BTL) OPTIME
TOPICAL | Status: DC | PRN
  Administered 2024-03-31: 1000 mL

## 2024-03-31 MED ORDER — AMISULPRIDE (ANTIEMETIC) 5 MG/2ML IV SOLN
10.0000 mg | Freq: Once | INTRAVENOUS | Status: DC | PRN
Start: 2024-03-31 — End: 2024-03-31

## 2024-03-31 MED ORDER — MIDAZOLAM HCL 2 MG/2ML IJ SOLN
INTRAMUSCULAR | Status: AC
  Filled 2024-03-31: qty 2

## 2024-03-31 MED ORDER — HYDROMORPHONE HCL 1 MG/ML IJ SOLN: 0.5000 mg | INTRAMUSCULAR | Status: DC | PRN

## 2024-03-31 MED ORDER — ORAL CARE MOUTH RINSE: 15.0000 mL | Freq: Once | OROMUCOSAL | Status: AC

## 2024-03-31 MED ORDER — OXYCODONE HCL 5 MG PO TABS
10.0000 mg | ORAL_TABLET | ORAL | Status: DC | PRN
  Administered 2024-03-31: 10 mg via ORAL
  Administered 2024-03-31: 15 mg via ORAL
  Filled 2024-03-31: qty 2
  Filled 2024-03-31: qty 3

## 2024-03-31 MED ORDER — PANTOPRAZOLE SODIUM 40 MG PO TBEC
40.0000 mg | DELAYED_RELEASE_TABLET | Freq: Every day | ORAL | Status: DC
  Administered 2024-04-01: 40 mg via ORAL
  Filled 2024-03-31: qty 1

## 2024-03-31 MED ORDER — DEXMEDETOMIDINE HCL IN NACL 80 MCG/20ML IV SOLN
INTRAVENOUS | Status: AC
  Filled 2024-03-31: qty 20

## 2024-03-31 MED ORDER — FENTANYL CITRATE (PF) 100 MCG/2ML IJ SOLN
INTRAMUSCULAR | Status: AC
  Filled 2024-03-31: qty 2

## 2024-03-31 MED ORDER — METOCLOPRAMIDE HCL 5 MG/ML IJ SOLN: 5.0000 mg | Freq: Three times a day (TID) | INTRAMUSCULAR | Status: DC | PRN

## 2024-03-31 MED ORDER — PHENOL 1.4 % MT LIQD: 1.0000 | OROMUCOSAL | Status: DC | PRN

## 2024-03-31 MED ORDER — TRANEXAMIC ACID-NACL 1000-0.7 MG/100ML-% IV SOLN
1000.0000 mg | INTRAVENOUS | Status: AC
  Administered 2024-03-31: 1000 mg via INTRAVENOUS
  Filled 2024-03-31: qty 100

## 2024-03-31 MED ORDER — PHENYLEPHRINE HCL-NACL 20-0.9 MG/250ML-% IV SOLN
INTRAVENOUS | Status: DC | PRN
  Administered 2024-03-31: 20 ug/min via INTRAVENOUS

## 2024-03-31 MED ORDER — ONDANSETRON HCL 4 MG/2ML IJ SOLN
INTRAMUSCULAR | Status: AC
  Filled 2024-03-31: qty 2

## 2024-03-31 MED ORDER — OXYCODONE HCL 5 MG PO TABS: 5.0000 mg | ORAL_TABLET | Freq: Once | ORAL | Status: DC | PRN

## 2024-03-31 MED ORDER — CEFAZOLIN SODIUM-DEXTROSE 2-4 GM/100ML-% IV SOLN
2.0000 g | Freq: Four times a day (QID) | INTRAVENOUS | Status: AC
  Administered 2024-03-31 (×2): 2 g via INTRAVENOUS
  Filled 2024-03-31 (×2): qty 100

## 2024-03-31 MED ORDER — CHLORHEXIDINE GLUCONATE 0.12 % MT SOLN
15.0000 mL | Freq: Once | OROMUCOSAL | Status: AC
  Administered 2024-03-31: 15 mL via OROMUCOSAL

## 2024-03-31 MED ORDER — CYCLOBENZAPRINE HCL 10 MG PO TABS: 10.0000 mg | ORAL_TABLET | Freq: Three times a day (TID) | ORAL | Status: DC | PRN

## 2024-03-31 MED ORDER — LIDOCAINE HCL (CARDIAC) PF 100 MG/5ML IV SOSY
PREFILLED_SYRINGE | INTRAVENOUS | Status: DC | PRN
  Administered 2024-03-31: 20 mg via INTRAVENOUS

## 2024-03-31 MED ORDER — MIDAZOLAM HCL (PF) 2 MG/2ML IJ SOLN
INTRAMUSCULAR | Status: DC | PRN
  Administered 2024-03-31: 2 mg via INTRAVENOUS

## 2024-03-31 MED ORDER — LACTATED RINGERS IV SOLN: INTRAVENOUS | Status: DC

## 2024-03-31 MED ORDER — POVIDONE-IODINE 10 % EX SWAB: 2.0000 | Freq: Once | CUTANEOUS | Status: DC

## 2024-03-31 MED ORDER — ORAL CARE MOUTH RINSE: 15.0000 mL | OROMUCOSAL | Status: DC | PRN

## 2024-03-31 MED ORDER — METOCLOPRAMIDE HCL 5 MG PO TABS: 5.0000 mg | ORAL_TABLET | Freq: Three times a day (TID) | ORAL | Status: DC | PRN

## 2024-03-31 MED ORDER — ONDANSETRON HCL 4 MG PO TABS: 4.0000 mg | ORAL_TABLET | Freq: Four times a day (QID) | ORAL | Status: DC | PRN

## 2024-03-31 MED ORDER — ALUM & MAG HYDROXIDE-SIMETH 200-200-20 MG/5ML PO SUSP
30.0000 mL | ORAL | Status: DC | PRN
Start: 2024-03-31 — End: 2024-04-01

## 2024-03-31 MED ORDER — FENTANYL CITRATE (PF) 50 MCG/ML IJ SOSY: 25.0000 ug | PREFILLED_SYRINGE | INTRAMUSCULAR | Status: DC | PRN

## 2024-03-31 MED ORDER — AMLODIPINE BESYLATE 5 MG PO TABS
2.5000 mg | ORAL_TABLET | Freq: Every day | ORAL | Status: DC
  Administered 2024-04-01: 2.5 mg via ORAL
  Filled 2024-03-31: qty 1

## 2024-03-31 MED ORDER — DEXMEDETOMIDINE HCL IN NACL 80 MCG/20ML IV SOLN
INTRAVENOUS | Status: DC | PRN
  Administered 2024-03-31: 4 ug via INTRAVENOUS

## 2024-03-31 MED ORDER — MENTHOL 3 MG MT LOZG: 1.0000 | LOZENGE | OROMUCOSAL | Status: DC | PRN

## 2024-03-31 MED ORDER — DEXAMETHASONE SOD PHOSPHATE PF 10 MG/ML IJ SOLN
INTRAMUSCULAR | Status: DC | PRN
  Administered 2024-03-31: 8 mg via INTRAVENOUS

## 2024-03-31 MED ORDER — DOCUSATE SODIUM 100 MG PO CAPS
100.0000 mg | ORAL_CAPSULE | Freq: Two times a day (BID) | ORAL | Status: DC
  Administered 2024-03-31 – 2024-04-01 (×2): 100 mg via ORAL
  Filled 2024-03-31 (×2): qty 1

## 2024-03-31 MED ORDER — ONDANSETRON HCL 4 MG/2ML IJ SOLN
INTRAMUSCULAR | Status: DC | PRN
Start: 2024-03-31 — End: 2024-03-31
  Administered 2024-03-31: 4 mg via INTRAVENOUS

## 2024-03-31 MED ORDER — OXYCODONE HCL 5 MG PO TABS
5.0000 mg | ORAL_TABLET | ORAL | Status: DC | PRN
  Administered 2024-04-01 (×2): 10 mg via ORAL
  Filled 2024-03-31 (×2): qty 2

## 2024-03-31 SURGICAL SUPPLY — 36 items
BAG COUNTER SPONGE SURGICOUNT (BAG) ×1 IMPLANT
BAG ZIPLOCK 12X15 (MISCELLANEOUS) ×1 IMPLANT
BENZOIN TINCTURE PRP APPL 2/3 (GAUZE/BANDAGES/DRESSINGS) IMPLANT
BLADE SAW SGTL 18X1.27X75 (BLADE) ×1 IMPLANT
COVER PERINEAL POST (MISCELLANEOUS) ×1 IMPLANT
COVER SURGICAL LIGHT HANDLE (MISCELLANEOUS) ×1 IMPLANT
CUP SECTOR GRIPTON 58MM (Orthopedic Implant) IMPLANT
DRAPE FOOT SWITCH (DRAPES) ×1 IMPLANT
DRAPE STERI IOBAN 125X83 (DRAPES) ×1 IMPLANT
DRAPE U-SHAPE 47X51 STRL (DRAPES) ×2 IMPLANT
DRSG AQUACEL AG ADV 3.5X10 (GAUZE/BANDAGES/DRESSINGS) ×1 IMPLANT
DURAPREP 26ML APPLICATOR (WOUND CARE) ×1 IMPLANT
ELECT PENCIL ROCKER SW 15FT (MISCELLANEOUS) ×1 IMPLANT
ELECT REM PT RETURN 15FT ADLT (MISCELLANEOUS) ×1 IMPLANT
GAUZE XEROFORM 1X8 LF (GAUZE/BANDAGES/DRESSINGS) IMPLANT
GLOVE BIO SURGEON STRL SZ7.5 (GLOVE) ×1 IMPLANT
GLOVE BIOGEL PI IND STRL 8 (GLOVE) ×2 IMPLANT
GLOVE ECLIPSE 8.0 STRL XLNG CF (GLOVE) ×1 IMPLANT
GOWN STRL REUS W/ TWL XL LVL3 (GOWN DISPOSABLE) ×2 IMPLANT
HEAD CERAMIC 36 PLUS 8.5 12 14 (Hips) IMPLANT
HOLDER FOLEY CATH W/STRAP (MISCELLANEOUS) ×1 IMPLANT
KIT TURNOVER KIT A (KITS) ×1 IMPLANT
LINER NEUTRAL 36X58 PLUS4 IMPLANT
PACK ANTERIOR HIP CUSTOM (KITS) ×1 IMPLANT
SET HNDPC FAN SPRY TIP SCT (DISPOSABLE) ×1 IMPLANT
STAPLER SKIN PROX 35W (STAPLE) IMPLANT
STEM FEM ACTIS HIGH SZ8 (Stem) IMPLANT
STRIP CLOSURE SKIN 1/2X4 (GAUZE/BANDAGES/DRESSINGS) IMPLANT
SUT ETHIBOND NAB CT1 #1 30IN (SUTURE) ×1 IMPLANT
SUT ETHILON 2 0 PS N (SUTURE) IMPLANT
SUT MNCRL AB 4-0 PS2 18 (SUTURE) IMPLANT
SUT VIC AB 0 CT1 36 (SUTURE) ×1 IMPLANT
SUT VIC AB 1 CT1 36 (SUTURE) ×1 IMPLANT
SUT VIC AB 2-0 CT1 TAPERPNT 27 (SUTURE) ×2 IMPLANT
TRAY FOLEY MTR SLVR 16FR STAT (SET/KITS/TRAYS/PACK) ×1 IMPLANT
YANKAUER SUCT BULB TIP NO VENT (SUCTIONS) ×1 IMPLANT

## 2024-03-31 NOTE — Interval H&P Note (Signed)
 History and Physical Interval Note: The patient understands that he is here today for a left total hip replacement to treat his significant left hip pain and osteoarthritis combined with osteonecrosis.  There has been no acute or interval changes in medical status.  The risks and benefits of surgery have been discussed in detail and informed consent has been obtained.  The left operative hip has been marked.  03/31/2024 6:57 AM  Terry Burton  has presented today for surgery, with the diagnosis of osteoarthritis left hip.  The various methods of treatment have been discussed with the patient and family. After consideration of risks, benefits and other options for treatment, the patient has consented to  Procedure(s): ARTHROPLASTY, HIP, TOTAL, ANTERIOR APPROACH (Left) as a surgical intervention.  The patient's history has been reviewed, patient examined, no change in status, stable for surgery.  I have reviewed the patient's chart and labs.  Questions were answered to the patient's satisfaction.     [name]

## 2024-03-31 NOTE — TOC Transition Note (Signed)
 Transition of Care [facility]) - Discharge Note   Patient Details  Name: Terry Burton MRN: [id number] Date of Birth: 1960-09-26  Transition of Care [facility]) CM/SW Contact:  NORMAN ASPEN, LCSW Phone Number: 03/31/2024, 3:57 PM   Clinical Narrative:     Met with pt and spouse who confirm need for RW and no DME agency preference.  Order placed with [employer] for delivery of item to room prior to dc.  Both aware that HHPT prearranged with [facility] via ortho MD office prior to surgery.  No further IP CM needs.  Final next level of care: Home w Home Health Services Barriers to Discharge: No Barriers Identified   Patient Goals and CMS Choice Patient states their goals for this hospitalization and ongoing recovery are:: return home          Discharge Placement                       Discharge Plan and Services Additional resources added to the After Visit Summary for                  DME Arranged: Walker rolling DME Agency: [employer] Date DME Agency Contacted: 03/31/24 Time DME Agency Contacted: [id number] Representative spoke with at DME Agency: Darly[facility] HH Arranged: PT HH Agency: [facility]        Social Drivers of Health (SDOH) Interventions SDOH Screenings   Food Insecurity: No Food Insecurity (03/31/2024)  Housing: Low Risk  (03/31/2024)  Transportation Needs: No Transportation Needs (03/31/2024)  Utilities: Not At Risk (03/31/2024)  Physical Activity: Insufficiently Active (02/24/2018)   Received from [facility] [place] visits prior to 08/01/2022.  Tobacco Use: Medium Risk (03/31/2024)     Readmission Risk Interventions     No data to display

## 2024-03-31 NOTE — Anesthesia Procedure Notes (Signed)
 Procedure Name: MAC Date/Time: 03/31/2024 7:12 AM  Performed by: Metta Andrea NOVAK, CRNAPre-anesthesia Checklist: Patient identified, Emergency Drugs available, Suction available, Patient being monitored and Timeout performed Oxygen Delivery Method: Simple face mask Placement Confirmation: positive ETCO2

## 2024-03-31 NOTE — Anesthesia Postprocedure Evaluation (Signed)
 Anesthesia Post Note  Patient: Terry Burton  Procedure(s) Performed: ARTHROPLASTY, HIP, TOTAL, ANTERIOR APPROACH (Left: Hip)     Patient location during evaluation: PACU Anesthesia Type: Spinal Level of consciousness: oriented and awake and alert Pain management: pain level controlled Vital Signs Assessment: post-procedure vital signs reviewed and stable Respiratory status: spontaneous breathing, respiratory function stable and patient connected to nasal cannula oxygen Cardiovascular status: blood pressure returned to baseline and stable Postop Assessment: no headache, no backache and no apparent nausea or vomiting Anesthetic complications: no   No notable events documented.  Last Vitals:  Vitals:   03/31/24 1047 03/31/24 1339  BP: 119/78 127/78  Pulse: (!) 52 74  Resp: 16 17  Temp:  36.4 C  SpO2: 100% 100%    Last Pain:  Vitals:   03/31/24 1345  TempSrc:   PainSc: 7                  Epifanio Lamar BRAVO

## 2024-03-31 NOTE — Transfer of Care (Signed)
 Immediate Anesthesia Transfer of Care Note  Patient: Terry Burton  Procedure(s) Performed: ARTHROPLASTY, HIP, TOTAL, ANTERIOR APPROACH (Left: Hip)  Patient Location: PACU  Anesthesia Type:Spinal  Level of Consciousness: drowsy and patient cooperative  Airway & Oxygen Therapy: Patient Spontanous Breathing and Patient connected to face mask oxygen  Post-op Assessment: Report given to RN and Post -op Vital signs reviewed and stable  Post vital signs: Reviewed and stable  Last Vitals:  Vitals Value Taken Time  BP 117/77 03/31/24 08:45  Temp    Pulse 60 03/31/24 08:47  Resp 18 03/31/24 08:47  SpO2 100 % 03/31/24 08:47  Vitals shown include unfiled device data.  Last Pain:  Vitals:   03/31/24 0629  TempSrc: Oral  PainSc:          Complications: No notable events documented.

## 2024-03-31 NOTE — Anesthesia Procedure Notes (Signed)
 Spinal  Patient location during procedure: OR Start time: 03/31/2024 7:20 AM Reason for block: surgical anesthesia Staffing Performed: resident/CRNA  Anesthesiologist: Epifanio Charleston, MD Resident/CRNA: Metta Andrea NOVAK, CRNA Performed by: Metta Andrea NOVAK, CRNA Authorized by: Epifanio Charleston, MD   Preanesthetic Checklist Completed: patient identified, IV checked, site marked, risks and benefits discussed, surgical consent, monitors and equipment checked, pre-op evaluation and timeout performed Spinal Block Patient position: sitting Prep: DuraPrep and site prepped and draped Patient monitoring: heart rate, cardiac monitor, continuous pulse ox and blood pressure Approach: midline Location: L3-4 Injection technique: single-shot Needle Needle type: Pencan  Needle gauge: 24 G Needle length: 10 cm Assessment Events: CSF return Additional Notes Pt placed in sitting position, spinal kit expiration date checked and verified, timeout, + CSF, - heme, pt tolerated well. Dr R. Epifanio present and supervising throughout SAB placement. Adequate sensory level block.

## 2024-03-31 NOTE — Op Note (Signed)
 Operative Note  Date of operation: 03/31/2024 Preoperative diagnosis: Left hip primary osteoarthritis with osteonecrosis Postoperative diagnosis: Same  Procedure: Left direct anterior total hip arthroplasty  Implants: Implant Name Type Inv. Item Serial No. Manufacturer Lot No. LRB No. Used Action  CUP SECTOR GRIPTON - [id number] Orthopedic Implant CUP SECTOR GRIPTON  DEPUY ORTHOPA[employer] [id number] Left 1 Implanted  LINER NEUTRAL 36X58 PLUS4 - [id number]  LINER NEUTRAL 36X58 PLUS4  DEPUY ORTHOPAEDICS [id number] Left 1 Implanted  STEM FEM ACTIS HIGH SZ8 - [id number] Stem STEM FEM ACTIS HIGH SZ8  DEPUY ORTHOPAEDICS [id number] Left 1 Implanted  HEAD CERAMIC 36 PLUS 8.5 12 14  - [id number] Hips HEAD CERAMIC 36 PLUS 8.5 12 14   DEPUY ORTHOPAEDICS [id number] Left 1 Implanted   Surgeon: Lonni GRADE. Vernetta, MD Assistant: Tory Gaskins, PA-C  Anesthesia: Spinal Antibiotics: IV Ancef  EBL: 275 cc Complications: None  Indications: The patient is a 63 year old gentleman with well-documented arthritis involving his left hip.  He had been doing well for a while which is conservative treatment but then sometime ago a deck that he was on collapsed injuring that left hip.  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  At this point his left hip pain has become daily and it is detrimentally affecting his mobility, his quality of life and his actives daily living.  In 2012 he actually replaced his right hip.  That has done well.  Having had surgery before he is fully aware of the risks of acute blood loss anemia, nerve and vessel injury, fracture, infection, dislocation, DVT, implant failure, leg length differences and wound healing issues.  He understands that our goals are hopefully decreased pain, improved mobility and improved quality of life.  Procedure description: After informed consent was obtained the appropriate left hip was marked, the patient was brought  to the operating room and set up on the stretcher where spinal anesthesia was obtained.  He was then laid in the supine position on stretcher and a Foley catheter is placed.  Traction boots were placed on both his feet and next he was placed supine on the Hana fracture table with a perineal post and placed in both legs in inline skeletal traction devices no traction applied.  His left operative hip and pelvis were assessed radiographically.  The left hip was prepped and draped with DuraPrep and sterile drapes.  A timeout was called and he was identified as the correct patient and the correct left hip.  An incision was then made just inferior and posterior to the ASIS and carried slightly obliquely down the leg.  Dissection was carried down to the tensor fascia lata muscle and the tensor fascia was then divided longitudinally to proceed with a direct anterior approach the hip.  Circumflex vessels were identified and cauterized.  The hip capsule was identified and opened up in L-type format.  Cobra retractors were placed along the medial and lateral femoral neck and a femoral neck cut was made with an oscillating saw just proximal to the lesser trochanter and this cut was completed with an osteotome.  A corkscrew guide was placed in the femoral head and the femoral head was removed in's entirety.  There was a wide area devoid of cartilage.  A bent Hohmann was then placed over the medial acetabular rim and remanence of the acetabular labrum and other debris removed.  Reaming was then initiated from a size 43 reamer and stepwise increments going up to a size  55 reamer with all reamers placed under direct visualization and the last reamer also placed under direct fluoroscopy in order to obtain the depth reaming, the inclination and anteversion.  The real DePuy sector GRIPTION acetabular component size 56 was then placed without difficulty followed by the real 36+4 polythene liner.  Attention was then turned to the femur.   With the left leg externally rotated 120 degrees, extended and adducted, a Mueller retractor was placed medially and a Hohmann tractor behind the greater trochanter.  The lateral joint capsule was released and a box cutting osteotome was used to enter the femoral canal.  Broaching was then initiated using the Actis broaching system from a size 0 going up to a size 8.  With a size 8 in place we trialed a high offset femoral neck based all his anatomy and medialization of the acetabular component.  We trialed a high offset femoral neck and a 36+1.5 trial head ball.  This reduced the pelvis and based on radiographic and clinical assessment we need to go decently longer.  We dislocated the hip and remove the trial components.  We then placed the real Actis femoral component size 8 with high offset and with the real 36+8.5 ceramic head ball.  This reduced the pelvis and we are pleased with stability on clinical exam as well as leg length and offset assessing this clinically and radiographically.  The soft tissue was then irrigated with normal saline solution.  Remnants of the joint capsule were closed with interrupted #1 Ethibond suture followed by #1 Vicryl close the tensor fascia.  0 Vicryl was used to close the deep tissue and 2-0 Vicryl was used to close the subcutaneous tissue.  The skin was closed with staples.  An Aquacel dressing was applied.  The patient was taken off the Hana table and taken to the recovery room.  Tory Gaskins, PA-C did assist during the entire case and beginning in and his assistance was crucial and medically necessary for soft tissue management and retraction, helping guide implant placement and a layered closure of the wound.

## 2024-03-31 NOTE — Evaluation (Signed)
 Physical Therapy Evaluation Patient Details Name: Terry Burton MRN: [id number] DOB: 10-05-1960 Today's Date: 03/31/2024  History of Present Illness  Pt s/p L THR and with hx of R THR in 2012  Clinical Impression  Pt s/p L THR and presents with decreased L LE strength/ROM, post op pain and orthostatic hypotension with OOB activity limiting functional mobility.  Pt motivated and should progress to dc home with family assist.        If plan is discharge home, recommend the following: A little help with walking and/or transfers;A little help with bathing/dressing/bathroom;Assistance with cooking/housework;Assist for transportation;Help with stairs or ramp for entrance   Can travel by private vehicle        Equipment Recommendations Rolling walker (2 wheels)  Recommendations for Other Services       Functional Status Assessment Patient has had a recent decline in their functional status and demonstrates the ability to make significant improvements in function in a reasonable and predictable amount of time.     Precautions / Restrictions Precautions Precautions: Fall Restrictions Weight Bearing Restrictions Per Provider Order: No Other Position/Activity Restrictions: WBAT      Mobility  Bed Mobility Overal bed mobility: Needs Assistance Bed Mobility: Supine to Sit     Supine to sit: Min assist     General bed mobility comments: cues for sequence and use of L LE to self assist    Transfers Overall transfer level: Needs assistance Equipment used: Rolling walker (2 wheels) Transfers: Sit to/from Stand, Bed to chair/wheelchair/BSC Sit to Stand: Min assist, +2 physical assistance   Step pivot transfers: Min assist, +2 physical assistance, +2 safety/equipment       General transfer comment: cues for LE management and use of UEs to self assist.  Physical assist to prevent fall with pt initially losing balance fwd; step pvt bed to chair    Ambulation/Gait                General Gait Details: Step pvt bed to recliner only 2* instability and c/o increasing dizziness.  Stairs            Wheelchair Mobility     Tilt Bed    Modified Rankin (Stroke Patients Only)       Balance Overall balance assessment: Needs assistance Sitting-balance support: No upper extremity supported, Feet supported Sitting balance-Leahy Scale: Good     Standing balance support: Bilateral upper extremity supported Standing balance-Leahy Scale: Poor                               Pertinent Vitals/Pain Pain Assessment Pain Assessment: 0-10 Pain Score: 4  Pain Descriptors / Indicators: Aching, Sore Pain Intervention(s): Limited activity within patient's tolerance, Monitored during session, Premedicated before session, Ice applied    Home Living Family/patient expects to be discharged to:: Private residence Living Arrangements: Spouse/significant other Available Help at Discharge: Family;Available 24 hours/day Type of Home: House Home Access: Stairs to enter   Entrance Stairs-Number of Steps: 1 Alternate Level Stairs-Number of Steps: flight Home Layout: Two level Home Equipment: Crutches      Prior Function Prior Level of Function : Independent/Modified Independent             Mobility Comments: Works as Catering Manager Extremity Assessment Upper Extremity Assessment: Overall WFL for tasks assessed    Lower Extremity Assessment Lower Extremity Assessment: LLE deficits/detail  Cervical / Trunk Assessment Cervical / Trunk Assessment: Normal  Communication   Communication Communication: No apparent difficulties    Cognition Arousal: Alert Behavior During Therapy: WFL for tasks assessed/performed   PT - Cognitive impairments: No apparent impairments                         Following commands: Intact       Cueing       General Comments      Exercises Total Joint  Exercises Ankle Circles/Pumps: AROM, Both, 15 reps, Supine   Assessment/Plan    PT Assessment Patient needs continued PT services  PT Problem List Decreased strength;Decreased range of motion;Decreased activity tolerance;Decreased balance;Decreased mobility;Decreased knowledge of use of DME;Pain       PT Treatment Interventions DME instruction;Gait training;Stair training;Functional mobility training;Therapeutic activities;Therapeutic exercise;Patient/family education    PT Goals (Current goals can be found in the Care Plan section)  Acute Rehab PT Goals Patient Stated Goal: Regain IND PT Goal Formulation: With patient Time For Goal Achievement: 04/07/24 Potential to Achieve Goals: Good    Frequency 7X/week     Co-evaluation               AM-PAC PT 6 Clicks Mobility  Outcome Measure Help needed turning from your back to your side while in a flat bed without using bedrails?: A Little Help needed moving from lying on your back to sitting on the side of a flat bed without using bedrails?: A Little Help needed moving to and from a bed to a chair (including a wheelchair)?: A Lot Help needed standing up from a chair using your arms (e.g., wheelchair or bedside chair)?: A Lot Help needed to walk in hospital room?: Total Help needed climbing 3-5 steps with a railing? : Total 6 Click Score: 12    End of Session Equipment Utilized During Treatment: Gait belt Activity Tolerance: Other (comment) (orthostatic) Patient left: in chair;with call bell/phone within reach;with chair alarm set Nurse Communication: Mobility status PT Visit Diagnosis: Difficulty in walking, not elsewhere classified (R26.2)    Time: 8451-8387 PT Time Calculation (min) (ACUTE ONLY): 24 min   Charges:   PT Evaluation $PT Eval Low Complexity: 1 Low PT Treatments $Therapeutic Activity: 8-22 mins PT General Charges $$ ACUTE PT VISIT: 1 Visit         [facility] PT Acute Rehabilitation  Services Office [phone]   [facility] 03/31/2024, 4:42 PM

## 2024-03-31 NOTE — Anesthesia Preprocedure Evaluation (Signed)
 Anesthesia Evaluation  Patient identified by MRN, date of birth, ID band Patient awake    Reviewed: Allergy & Precautions, NPO status , Patient's Chart, lab work & pertinent test results  History of Anesthesia Complications (+) PONV and history of anesthetic complications  Airway Mallampati: II  TM Distance: >3 FB Neck ROM: Full    Dental   Pulmonary former smoker   breath sounds clear to auscultation       Cardiovascular hypertension, Pt. on medications  Rhythm:Regular Rate:Normal     Neuro/Psych negative neurological ROS     GI/Hepatic negative GI ROS, Neg liver ROS,,,  Endo/Other  negative endocrine ROS    Renal/GU negative Renal ROS     Musculoskeletal  (+) Arthritis ,    Abdominal   Peds  Hematology negative hematology ROS (+)   Anesthesia Other Findings   Reproductive/Obstetrics                              Anesthesia Physical Anesthesia Plan  ASA: 2  Anesthesia Plan: Spinal   Post-op Pain Management: Tylenol  PO (pre-op)* and Toradol  IV (intra-op)*   Induction:   PONV Risk Score and Plan: 2 and Propofol  infusion, Dexamethasone , Ondansetron  and Treatment may vary due to age or medical condition  Airway Management Planned: Natural Airway and Simple Face Mask  Additional Equipment:   Intra-op Plan:   Post-operative Plan:   Informed Consent: I have reviewed the patients History and Physical, chart, labs and discussed the procedure including the risks, benefits and alternatives for the proposed anesthesia with the patient or authorized representative who has indicated his/her understanding and acceptance.       Plan Discussed with:   Anesthesia Plan Comments:         Anesthesia Quick Evaluation

## 2024-04-01 DIAGNOSIS — M1612 Unilateral primary osteoarthritis, left hip: Secondary | ICD-10-CM | POA: Diagnosis not present

## 2024-04-01 LAB — BASIC METABOLIC PANEL WITH GFR
Anion gap: 10 (ref 5–15)
BUN: 13 mg/dL (ref 8–23)
CO2: 24 mmol/L (ref 22–32)
Calcium: 8.3 mg/dL — ABNORMAL LOW (ref 8.9–10.3)
Chloride: 103 mmol/L (ref 98–111)
Creatinine, Ser: 0.92 mg/dL (ref 0.61–1.24)
GFR, Estimated: >60 mL/min (ref >60)
Glucose, Bld: 150 mg/dL — ABNORMAL HIGH (ref 70–99)
Potassium: 4.4 mmol/L (ref 3.5–5.1)
Sodium: 136 mmol/L (ref 135–145)

## 2024-04-01 LAB — CBC
HCT: 30.8 % — ABNORMAL LOW (ref 39.0–52.0)
Hemoglobin: 10.1 g/dL — ABNORMAL LOW (ref 13.0–17.0)
MCH: 30.4 pg (ref 26.0–34.0)
MCHC: 32.8 g/dL (ref 30.0–36.0)
MCV: 92.8 fL (ref 80.0–100.0)
Platelets: 163 K/uL (ref 150–400)
RBC: 3.32 MIL/uL — ABNORMAL LOW (ref 4.22–5.81)
RDW: 13.1 % (ref 11.5–15.5)
WBC: 11.5 K/uL — ABNORMAL HIGH (ref 4.0–10.5)
nRBC: 0 % (ref 0.0–0.2)

## 2024-04-01 MED ORDER — OXYCODONE HCL 5 MG PO TABS: 5.0000 mg | ORAL_TABLET | Freq: Four times a day (QID) | ORAL | 0 refills | Status: DC | PRN

## 2024-04-01 MED ORDER — CYCLOBENZAPRINE HCL 10 MG PO TABS: 10.0000 mg | ORAL_TABLET | Freq: Three times a day (TID) | ORAL | 1 refills | Status: AC | PRN

## 2024-04-01 MED ORDER — ASPIRIN 81 MG PO CHEW: 81.0000 mg | CHEWABLE_TABLET | Freq: Two times a day (BID) | ORAL | 0 refills | Status: DC

## 2024-04-01 NOTE — Plan of Care (Signed)

## 2024-04-01 NOTE — Plan of Care (Signed)
Discharge instructions given to the patient including medications and follow ups.

## 2024-04-01 NOTE — Discharge Instructions (Signed)

## 2024-04-01 NOTE — Progress Notes (Signed)
 Physical Therapy Treatment Patient Details Name: Terry Burton MRN: [id number] DOB: April 09, 1961 Today's Date: 04/01/2024   History of Present Illness Pt s/p L THR and with hx of R THR in 2012    PT Comments  Pt progressing well with mobility including up to ambulate increased distance in hall, negotiated stairs, and reviewed car transfers.  Pt eager for dc home this date.    If plan is discharge home, recommend the following: A little help with walking and/or transfers;A little help with bathing/dressing/bathroom;Assistance with cooking/housework;Assist for transportation;Help with stairs or ramp for entrance   Can travel by private vehicle        Equipment Recommendations  Rolling walker (2 wheels)    Recommendations for Other Services       Precautions / Restrictions Precautions Precautions: Fall Restrictions Weight Bearing Restrictions Per Provider Order: No Other Position/Activity Restrictions: WBAT     Mobility  Bed Mobility               General bed mobility comments: Pt up in chair and requests back to same    Transfers Overall transfer level: Needs assistance Equipment used: Rolling walker (2 wheels) Transfers: Sit to/from Stand Sit to Stand: Supervision           General transfer comment: cues for LE management and use of UEs to self assist.    Ambulation/Gait Ambulation/Gait assistance: Contact guard assist, Supervision Gait Distance (Feet): 120 Feet (and 15' back from bathroom) Assistive device: Rolling walker (2 wheels) Gait Pattern/deviations: Step-to pattern, Step-through pattern, Decreased step length - right, Decreased step length - left, Shuffle, Trunk flexed Gait velocity: decr     General Gait Details: min cues for sequence, posture and position from RW   Stairs Stairs: Yes Stairs assistance: Min assist Stair Management: One rail Right, Step to pattern, Forwards, With crutches Number of Stairs: 10 General stair comments: cues  for sequence   Wheelchair Mobility     Tilt Bed    Modified Rankin (Stroke Patients Only)       Balance Overall balance assessment: Needs assistance Sitting-balance support: No upper extremity supported, Feet supported Sitting balance-Leahy Scale: Good     Standing balance support: No upper extremity supported Standing balance-Leahy Scale: Fair                              Hotel Manager: No apparent difficulties  Cognition Arousal: Alert Behavior During Therapy: WFL for tasks assessed/performed   PT - Cognitive impairments: No apparent impairments                         Following commands: Intact      Cueing Cueing Techniques: Verbal cues  Exercises Total Joint Exercises Ankle Circles/Pumps: AROM, Both, 15 reps, Supine Quad Sets: AROM, Both, 10 reps, Supine Heel Slides: AAROM, Left, 15 reps, Supine Hip ABduction/ADduction: AAROM, Left, 10 reps, Supine    General Comments        Pertinent Vitals/Pain Pain Assessment Pain Assessment: 0-10 Pain Score: 5  Pain Location: L hip Pain Descriptors / Indicators: Aching, Sore Pain Intervention(s): Limited activity within patient's tolerance, Monitored during session, Premedicated before session, Ice applied    Home Living                          Prior Function  PT Goals (current goals can now be found in the care plan section) Acute Rehab PT Goals Patient Stated Goal: Regain IND PT Goal Formulation: With patient Time For Goal Achievement: 04/07/24 Potential to Achieve Goals: Good Progress towards PT goals: Progressing toward goals    Frequency    7X/week      PT Plan      Co-evaluation              AM-PAC PT 6 Clicks Mobility   Outcome Measure  Help needed turning from your back to your side while in a flat bed without using bedrails?: A Little Help needed moving from lying on your back to sitting on the side of a  flat bed without using bedrails?: A Little Help needed moving to and from a bed to a chair (including a wheelchair)?: A Little Help needed standing up from a chair using your arms (e.g., wheelchair or bedside chair)?: A Little Help needed to walk in hospital room?: A Little Help needed climbing 3-5 steps with a railing? : A Lot 6 Click Score: 17    End of Session Equipment Utilized During Treatment: Gait belt Activity Tolerance: Patient tolerated treatment well Patient left: in chair;with call bell/phone within reach;with chair alarm set;with family/visitor present Nurse Communication: Mobility status PT Visit Diagnosis: Difficulty in walking, not elsewhere classified (R26.2)     Time: 1205-1224 PT Time Calculation (min) (ACUTE ONLY): 19 min  Charges:    $Gait Training: 8-22 mins $Therapeutic Exercise: 8-22 mins PT General Charges $$ ACUTE PT VISIT: 1 Visit                     [facility] PT Acute Rehabilitation Services Office [phone]    Terry Burton 04/01/2024, 1:24 PM

## 2024-04-01 NOTE — Progress Notes (Signed)
 Physical Therapy Treatment Patient Details Name: Terry Burton MRN: [id number] DOB: 21-Jan-1961 Today's Date: 04/01/2024   History of Present Illness Pt s/p L THR and with hx of R THR in 2012    PT Comments  Pt cooperative and progressing well with mobility including up to ambulate in hall and HEp initiated.  Pt should progress to dc home later today after stair training.    If plan is discharge home, recommend the following: A little help with walking and/or transfers;A little help with bathing/dressing/bathroom;Assistance with cooking/housework;Assist for transportation;Help with stairs or ramp for entrance   Can travel by private vehicle        Equipment Recommendations  Rolling walker (2 wheels)    Recommendations for Other Services       Precautions / Restrictions Precautions Precautions: Fall Restrictions Weight Bearing Restrictions Per Provider Order: No Other Position/Activity Restrictions: WBAT     Mobility  Bed Mobility               General bed mobility comments: Pt up in chair and requests back to same    Transfers Overall transfer level: Needs assistance Equipment used: Rolling walker (2 wheels) Transfers: Sit to/from Stand Sit to Stand: Contact guard assist           General transfer comment: cues for LE management and use of UEs to self assist.    Ambulation/Gait Ambulation/Gait assistance: Contact guard assist Gait Distance (Feet): 100 Feet Assistive device: Rolling walker (2 wheels) Gait Pattern/deviations: Step-to pattern, Step-through pattern, Decreased step length - right, Decreased step length - left, Shuffle, Trunk flexed Gait velocity: decr     General Gait Details: min cues for sequence, posture and position from [employer]             Wheelchair Mobility     Tilt Bed    Modified Rankin (Stroke Patients Only)       Balance Overall balance assessment: Needs assistance Sitting-balance support: No upper  extremity supported, Feet supported Sitting balance-Leahy Scale: Good     Standing balance support: Single extremity supported Standing balance-Leahy Scale: Poor                              Communication Communication Communication: No apparent difficulties  Cognition Arousal: Alert Behavior During Therapy: WFL for tasks assessed/performed   PT - Cognitive impairments: No apparent impairments                         Following commands: Intact      Cueing Cueing Techniques: Verbal cues  Exercises Total Joint Exercises Ankle Circles/Pumps: AROM, Both, 15 reps, Supine Quad Sets: AROM, Both, 10 reps, Supine Heel Slides: AAROM, Left, 15 reps, Supine Hip ABduction/ADduction: AAROM, Left, 10 reps, Supine    General Comments        Pertinent Vitals/Pain Pain Assessment Pain Assessment: 0-10 Pain Score: 5  Pain Location: L hip Pain Descriptors / Indicators: Aching, Sore Pain Intervention(s): Limited activity within patient's tolerance, Monitored during session, Premedicated before session, Ice applied    Home Living                          Prior Function            PT Goals (current goals can now be found in the care plan section) Acute Rehab PT Goals Patient Stated Goal:  Regain IND PT Goal Formulation: With patient Time For Goal Achievement: 04/07/24 Potential to Achieve Goals: Good Progress towards PT goals: Progressing toward goals    Frequency    7X/week      PT Plan      Co-evaluation              AM-PAC PT 6 Clicks Mobility   Outcome Measure  Help needed turning from your back to your side while in a flat bed without using bedrails?: A Little Help needed moving from lying on your back to sitting on the side of a flat bed without using bedrails?: A Little Help needed moving to and from a bed to a chair (including a wheelchair)?: A Little Help needed standing up from a chair using your arms (e.g., wheelchair  or bedside chair)?: A Little Help needed to walk in hospital room?: A Little Help needed climbing 3-5 steps with a railing? : A Lot 6 Click Score: 17    End of Session Equipment Utilized During Treatment: Gait belt Activity Tolerance: Patient tolerated treatment well Patient left: in chair;with call bell/phone within reach;with chair alarm set;with family/visitor present Nurse Communication: Mobility status PT Visit Diagnosis: Difficulty in walking, not elsewhere classified (R26.2)     Time: 8986-8962 PT Time Calculation (min) (ACUTE ONLY): 24 min  Charges:    $Gait Training: 8-22 mins $Therapeutic Exercise: 8-22 mins PT General Charges $$ ACUTE PT VISIT: 1 Visit                     [facility] PT Acute Rehabilitation Services Office [phone]    Twila Rappa 04/01/2024, 1:15 PM

## 2024-04-01 NOTE — Progress Notes (Signed)
 Subjective: 1 Day Post-Op Procedure(s) (LRB): ARTHROPLASTY, HIP, TOTAL, ANTERIOR APPROACH (Left) Patient reports pain as moderate.    Objective: Vital signs in last 24 hours: Temp:  [97.6 F (36.4 C)-99.7 F (37.6 C)] 98.2 F (36.8 C) (11/01 1005) Pulse Rate:  [74-93] 85 (11/01 1005) Resp:  [16-18] 18 (11/01 1005) BP: (123-131)/(72-82) 125/82 (11/01 1005) SpO2:  [97 %-100 %] 100 % (11/01 1005)  Intake/Output from previous day: 10/31 0701 - 11/01 0700 In: 3225.9 [P.O.:700; I.V.:2225.9; IV Piggyback:300] Out: 2550 [Urine:2275; Blood:275] Intake/Output this shift: No intake/output data recorded.  Recent Labs    04/01/24 0320  HGB 10.1*   Recent Labs    04/01/24 0320  WBC 11.5*  RBC 3.32*  HCT 30.8*  PLT 163   Recent Labs    04/01/24 0320  NA 136  K 4.4  CL 103  CO2 24  BUN 13  CREATININE 0.92  GLUCOSE 150*  CALCIUM 8.3*   No results for input(s): LABPT, INR in the last 72 hours.  Sensation intact distally Intact pulses distally Dorsiflexion/Plantar flexion intact Incision: scant drainage   Assessment/Plan: 1 Day Post-Op Procedure(s) (LRB): ARTHROPLASTY, HIP, TOTAL, ANTERIOR APPROACH (Left) Up with therapy Discharge home with home health this afternoon.      Terry Burton 04/01/2024, 10:57 AM

## 2024-04-02 NOTE — Discharge Summary (Signed)
 Patient ID: Terry Burton MRN: [id number] DOB/AGE: 09-16-1960 63 y.o.  Admit date: 03/31/2024 Discharge date: 04/01/2024  Admission Diagnoses:  Principal Problem:   Unilateral primary osteoarthritis, left hip Active Problems:   Status post total replacement of left hip   Discharge Diagnoses:  Same  Past Medical History:  Diagnosis Date   Anemia    As a child   Arthritis    right hip and back with pain down right leg   Gout    Hypertension    PONV (postoperative nausea and vomiting)     Surgeries: Procedure(s): ARTHROPLASTY, HIP, TOTAL, ANTERIOR APPROACH on 03/31/2024   Consultants:   Discharged Condition: Improved  Hospital Course: Terry Burton is an 63 y.o. male who was admitted 03/31/2024 for operative treatment ofUnilateral primary osteoarthritis, left hip. Patient has severe unremitting pain that affects sleep, daily activities, and work/hobbies. After pre-op clearance the patient was taken to the operating room on 03/31/2024 and underwent  Procedure(s): ARTHROPLASTY, HIP, TOTAL, ANTERIOR APPROACH.    Patient was given perioperative antibiotics:  Anti-infectives (From admission, onward)    Start     Dose/Rate Route Frequency Ordered Stop   03/31/24 1330  ceFAZolin  (ANCEF ) IVPB 2g/100 mL premix        2 g 200 mL/hr over 30 Minutes Intravenous Every 6 hours 03/31/24 1034 04/01/24 1129   03/31/24 0600  ceFAZolin  (ANCEF ) IVPB 2g/100 mL premix        2 g 200 mL/hr over 30 Minutes Intravenous On call to O.R. 03/31/24 9473 03/31/24 9278        Patient was given sequential compression devices, early ambulation, and chemoprophylaxis to prevent DVT.  Inpatient Morphine  Milligram Equivalents Per Day 10/31 - 11/1   Values displayed are in units of MME/Day    Order Start / End Date 10/31 Yesterday    oxyCODONE  (Oxy IR/ROXICODONE ) immediate release tablet 5 mg 10/31 - 10/31 0 of Unknown --    oxyCODONE  (ROXICODONE ) 5 MG/5ML solution 5 mg 10/31 - 10/31 0 of Unknown --       Group total: 0 of Unknown     fentaNYL  (SUBLIMAZE ) injection 25-50 mcg 10/31 - 10/31 0 of 45-90 --    fentaNYL  (SUBLIMAZE ) injection 10/31 - 10/31 *15 of 15 --    HYDROmorphone  (DILAUDID ) injection 0.5-1 mg 10/31 - 11/1 0 of 40-80 0 of 50-100    oxyCODONE  (Oxy IR/ROXICODONE ) immediate release tablet 5-10 mg 10/31 - 11/1 0 of 30-60 30 of 37.5-75    oxyCODONE  (Oxy IR/ROXICODONE ) immediate release tablet 10-15 mg 10/31 - 11/1 37.5 of 60-90 0 of 75-112.5    Daily Totals  * 52.5 of Unknown (at least 190-335) 30 of 162.5-287.5  *One-Step medication  Calculation Errors     Order Type Date Details   oxyCODONE  (Oxy IR/ROXICODONE ) immediate release tablet 5 mg Ordered Dose -- Insufficient frequency information   oxyCODONE  (ROXICODONE ) 5 MG/5ML solution 5 mg Ordered Dose -- Insufficient frequency information            Patient benefited maximally from hospital stay and there were no complications.    Recent vital signs: Patient Vitals for the past 24 hrs:  BP Temp Pulse Resp SpO2  04/01/24 1005 125/82 98.2 F (36.8 C) 85 18 100 %  04/01/24 0849 130/76 -- -- -- --     Recent laboratory studies:  Recent Labs    04/01/24 0320  WBC 11.5*  HGB 10.1*  HCT 30.8*  PLT 163  NA 136  K  4.4  CL 103  CO2 24  BUN 13  CREATININE 0.92  GLUCOSE 150*  CALCIUM 8.3*     Discharge Medications:   Allergies as of 04/01/2024   No Active Allergies      Medication List     TAKE these medications    allopurinol 300 MG tablet Commonly known as: ZYLOPRIM Take 300 mg by mouth 2 (two) times a week.   amLODipine 2.5 MG tablet Commonly known as: NORVASC Take 2.5 mg by mouth daily.   aspirin  81 MG chewable tablet Chew 1 tablet (81 mg total) by mouth 2 (two) times daily.   BLACK CURRANT SEED OIL PO Take 1 capsule by mouth daily.   colchicine  0.6 MG tablet Take 1 tablet (0.6 mg total) by mouth daily. Take 2 pills first dose and then 1 pill 1 hour later.  You can continue 1 pill daily  for 7 to 10 days until improved. What changed:  when to take this reasons to take this additional instructions   cyclobenzaprine  10 MG tablet Commonly known as: FLEXERIL  Take 1 tablet (10 mg total) by mouth 3 (three) times daily as needed for muscle spasms. What changed: when to take this   ibuprofen  800 MG tablet Commonly known as: ADVIL  Take 1 tablet (800 mg total) by mouth every 8 (eight) hours as needed.   indomethacin  50 MG capsule Commonly known as: INDOCIN  Take 50 mg by mouth daily as needed for moderate pain (pain score 4-6).   oxyCODONE  5 MG immediate release tablet Commonly known as: Oxy IR/ROXICODONE  Take 1-2 tablets (5-10 mg total) by mouth every 6 (six) hours as needed for moderate pain (pain score 4-6) (pain score 4-6).        Diagnostic Studies: DG HIP UNILAT WITH PELVIS 1V LEFT Result Date: 03/31/2024 EXAM: 3 SPOT FLUOROSCOPIC IMAGES XRAY OF THE LEFT HIP 03/31/2024 08:23:00 AM COMPARISON: None available. CLINICAL HISTORY: [id number] Surgery, elective [id number] Surgery, elective [phone] FINDINGS: BONES AND JOINTS: Intraoperative fluoroscopy was utilized for surgical control purposes. Fluoroscopy time 13 seconds. Dose 1.7651 mGy. Spot fluoroscopic images demonstrate placement of a left hip arthroplasty using noncemented components. Components appear well seated. No acute bony abnormalities. SOFT TISSUES: The soft tissues are unremarkable. EXAM: 1 VIEW XRAY OF THE PELVIS AND LEFT HIP 03/31/2024 08:23:00 AM COMPARISON: None available. CLINICAL HISTORY: [id number] Surgery, elective [id number] Surgery, elective [phone] FINDINGS: BONES AND JOINTS: Left total hip arthroplasty using non-cemented components. Components appear well seated. No acute fracture or dislocation. Incidental note of a previous right hip arthroplasty. SOFT TISSUES: Soft tissue gas and skin clips are consistent with recent surgery. IMPRESSION: 1. Left hip fluoroscopy: Intraoperative fluoroscopy was utilized for surgical  control purposes. 2. Pelvis and left hip: Left total hip arthroplasty with noncemented components that appear well seated. No acute fracture or dislocation. Incidental prior right hip arthroplasty. Electronically signed by: Elsie Gravely MD 03/31/2024 05:03 PM EDT RP Workstation: [id number]   DG Pelvis Portable Result Date: 03/31/2024 EXAM: 3 SPOT FLUOROSCOPIC IMAGES XRAY OF THE LEFT HIP 03/31/2024 08:23:00 AM COMPARISON: None available. CLINICAL HISTORY: [id number] Surgery, elective [id number] Surgery, elective [phone] FINDINGS: BONES AND JOINTS: Intraoperative fluoroscopy was utilized for surgical control purposes. Fluoroscopy time 13 seconds. Dose 1.7651 mGy. Spot fluoroscopic images demonstrate placement of a left hip arthroplasty using noncemented components. Components appear well seated. No acute bony abnormalities. SOFT TISSUES: The soft tissues are unremarkable. EXAM: 1 VIEW XRAY OF THE PELVIS AND LEFT HIP 03/31/2024 08:23:00 AM COMPARISON: None available. CLINICAL HISTORY: [id number]  Surgery, elective [id number] Surgery, elective [phone] FINDINGS: BONES AND JOINTS: Left total hip arthroplasty using non-cemented components. Components appear well seated. No acute fracture or dislocation. Incidental note of a previous right hip arthroplasty. SOFT TISSUES: Soft tissue gas and skin clips are consistent with recent surgery. IMPRESSION: 1. Left hip fluoroscopy: Intraoperative fluoroscopy was utilized for surgical control purposes. 2. Pelvis and left hip: Left total hip arthroplasty with noncemented components that appear well seated. No acute fracture or dislocation. Incidental prior right hip arthroplasty. Electronically signed by: Elsie Gravely MD 03/31/2024 05:03 PM EDT RP Workstation: [id number]   DG C-Arm 1-60 Min-No Report Result Date: 03/31/2024 Fluoroscopy was utilized by the requesting physician.  No radiographic interpretation.    Disposition: Discharge disposition: 01-Home or Self Care           Follow-up Information     [facility] Follow up.   Specialty: Home Health Services Why: to provde home physical therapy visits Contact information: [address] [place] [address] [phone]         Vernetta Lonni GRADE, MD Follow up in 2 week(s).   Specialty: Orthopedic Surgery Contact information: [address] [place]  [place] [address] [phone]                  Signed: Lonni GRADE Vernetta 04/02/2024, 6:58 AM

## 2024-04-03 ENCOUNTER — Encounter: Payer: Self-pay | Admitting: Radiology

## 2024-04-03 ENCOUNTER — Encounter (HOSPITAL_COMMUNITY): Payer: Self-pay | Admitting: Orthopaedic Surgery

## 2024-04-03 LAB — TYPE AND SCREEN
ABO/RH(D): O POS
Antibody Screen: NEGATIVE

## 2024-04-13 ENCOUNTER — Encounter: Payer: Self-pay | Admitting: Orthopaedic Surgery

## 2024-04-13 ENCOUNTER — Ambulatory Visit (INDEPENDENT_AMBULATORY_CARE_PROVIDER_SITE_OTHER): Admitting: Orthopaedic Surgery

## 2024-04-13 DIAGNOSIS — Z96642 Presence of left artificial hip joint: Secondary | ICD-10-CM

## 2024-04-13 MED ORDER — IBUPROFEN 800 MG PO TABS
800.0000 mg | ORAL_TABLET | Freq: Three times a day (TID) | ORAL | 1 refills | Status: AC | PRN
Start: 2024-04-13 — End: ?

## 2024-04-13 MED ORDER — OXYCODONE HCL 5 MG PO TABS: 5.0000 mg | ORAL_TABLET | Freq: Four times a day (QID) | ORAL | 0 refills | Status: AC | PRN

## 2024-04-13 NOTE — Progress Notes (Signed)
 Terry Burton is here today for his first postoperative visit status post a left total hip arthroplasty.  We replaced his right hip about 12 years ago and that is doing well.  He is a naval architect.  He does need a refill of oxycodone  and is requesting also had a milligram ibuprofen .  He has been on these in the past.  On exam his left hip incision looks good.  The staples are removed and Steri-Strips applied.  There is moderate swelling but we decided not to aspirate any type of seroma.  His calf is soft.  He is traveling in 2 weeks recommended he wear compressive garments for the plane flight.  He would like to have outpatient physical therapy in [facility] so I did give him a prescription for calling a place in [facility] for outpatient physical therapy.  I did send in ibuprofen  and oxycodone  for him.  Will see him back in a month to see how he is doing from a mobility standpoint but no x-rays are needed.  All questions and concerns were addressed and answered.

## 2024-05-10 ENCOUNTER — Ambulatory Visit (INDEPENDENT_AMBULATORY_CARE_PROVIDER_SITE_OTHER): Admitting: Orthopaedic Surgery

## 2024-05-10 ENCOUNTER — Encounter: Payer: Self-pay | Admitting: Orthopaedic Surgery

## 2024-05-10 DIAGNOSIS — Z96642 Presence of left artificial hip joint: Secondary | ICD-10-CM

## 2024-05-10 NOTE — Progress Notes (Signed)
 Physical Therapy Visit - Daily Note   Payor: BCBS / Plan: BCBS OOS PPO / Product Type: PPO /   Visit Count: 5 Days since surgery: 40  Rehabilitation Precautions/Restrictions:   Precautions/Restrictions Precautions: L THA 03/31/24, history of prior R THA, R knee pain/gout, L lumbar radiculopathy Restrictions: anterior hip replacement - avoid hip hyperextension        Referring Diagnosis: Z96.642 - Status post total hip replacement, left   SUBJECTIVE Patient Report:   Sore this morning, but not as bad as Monday when it snowed.  Think its the weather.  Night still rough.  Went to gym yesterday and worked on leg too.  Sees Dr. Vernetta after PT today for follow-up.    Change in Status Since Last Visit: No Pain:  Pain Assessment Pain Assessment: 0-10 Pain Score  : 4 Pain Type: Acute pain Pain Location: Hip Pain Orientation: Left  OBJECTIVE  General Observation/Objective Measures:         Enters without AD, antalgic gait.  90 deg hip flexion.  Limited hip IR and ER.        Interventions:   Therapeutic Exercise x 40 min: to improve strength, endurance and mobility.  Therapist provided demonstration and cues as needed for technique.   Recumbant bike x 7 min for warm-up, muscle perfusion, and subjective.  Calf stretch on slant board 5 x 10 sec hold Step ups 4 with hip flexion  4 x 5 bil  Hip extension/abduction taps 4 x 5 bil  In supine -  LTR x 10 Hip ER/IR x 10 Bent knee fall-outs x 10  Bridges x 15  SLR x 10 R, x 10 5   Manual Therapy x 10 min : to improve L hip ROM and improve tissue extensibility   Glute max stretch to improve L hip flexion, hamstring stretch to tolerance, gentle PROM into IR.  Left hip distraction to relax musculature and decrease pain.    Education: Yes, as described in interventions  Access Code: [email or web address] URL: [email or web address] Date: 05/05/2024 Prepared by: Almarie Sprinkles  Exercises - Supine Bridge  - 1 x  daily - 7 x weekly - 2-3 sets - 10 reps - Bent Knee Fallouts  - 1 x daily - 7 x weekly - 2-3 sets - 10 reps - Hooklying Single Knee to Chest Stretch  - 2 x daily - 5 x weekly - 1 sets - 3 reps - 30 hold - Supine Heel Slide  - 1 x daily - 7 x weekly - 2-3 sets - 10 reps - Hooklying Hamstring Stretch with Strap  - 1 x daily - 3 x weekly - 3 sets - 15 reps - 5 hold - Small Range Straight Leg Raise  - 1 x daily - 7 x weekly - 2-3 sets - 10 reps - Standing Hip Extension  - 1 x daily - 7 x weekly - 3 sets - 10 reps  ASSESSMENT Terry Burton still reporting soreness.  He is exercising at gym daily, with discussion noted attempting single leg activities which he is not ready for, assured that we would be working on balance in therapy but first needed to focus on strength.  Noted improvement in L hip flexor strength today as able to perform L SLR without assistance, but still fatigued very quickly.  Also noted still very tight.  Reported decreased pain after manual therapy.   Terry Burton will benefit from continued skilled therapy to improve strength, gait and activity tolerance.  Therapy Diagnosis:     ICD-10-CM   1. Left hip pain  M25.552   2. Status post total replacement of left hip  Z96.642   3. Hip stiffness, left  M25.652   4. Weakness of left hip  R29.898   5. Impaired functional mobility, balance, gait, and endurance  Z74.09       Progress Towards Goals:   as above   Goals Addressed             This Visit's Progress    PT Goal       SHORT TERM GOALS: Target date: 10 visits  1. Patient will be independent with initial HEP. Baseline: given bridges today Goal status: 05/05/2024 met  2. Patient will be able to ambulate without antalgia or AD for 200' Baseline: antalgic, using single crutch, noted fatigues quickly and antalgia worsens after 50' Goal status: 05/05/2024 still antalgic but no longer using AD 3. Patient will report decreased L thigh pain to no more  than 2/10 at rest.   Baseline: 3-6/10 Goal status: 05/10/2024 still reporting soreness 3-6/10   LONG TERM GOALS: Target date: 20 visits 1. Patient will be independent with advanced/ongoing HEP to improve outcomes and carryover. Baseline: needs progression  Goal status: Initial 2. Patient will report no more than 1/10 L hip pain with activity to improve QOL. Baseline: 3-6/10  Goal status: Initial 3. Patient will demonstrate improved functional LE strength as demonstrated by 5/5 L hip strength without pain. Baseline:   Right LE 04/25/24 Left LE 04/25/24  Hip flexion 4 pain 5  Hip extension NT NT  Hip adduction 5 5  Hip abduction 4+ pain 5   Goal status: Initial 4. Patient will be able to ambulate 1200' without pain and normal gait pattern without increased pain to access community. Baseline: using crutch, antalgic Goal status: Initial 5. Patient will report 0-2 on HOOS JR  to demonstrate improved functional ability. Baseline: 14 Goal status: Initial 6. Patient will demonstrate improved L hip AROM = R hip AROM to be able to perform activities like putting on shoes.   Baseline:   Right LE (in degrees) 04/25/24 Left LE (in degrees) 04/25/24 Norms (in degrees)    Hip flexion 118 90 110-120  Hip external rotation 35 10 40-60  Hip internal rotation 12 10 30-40   Goal status: Initial           PLAN Treatment Frequency and Duration:  PT Frequency: 2x/week; 3x/week PT Duration: Other (comment) Treatment Plan Details: 2-3x/week for up to 20 visits  Recommended PT Treatment/Interventions: Dry needling (1-2 muscles) (79439); Dry Needling (3+ muscles) (79438); Electrical stimulation-attended (02967); Electrical stimulation-unattended (02985); Manual therapy (97140); Neuromuscular re-education [phone]); Physical Performance Testing (02249); Self-care/home management [phone]); Therapeutic activity (97530); Therapeutic exercise (97110); Therapeutic massage [phone]) (TrDN in lower leg  only, not near incision)   Recommended Consults:  None currently  Development of Plan of Care:  No change in POC.  Total Treatment Time (Time & Untimed): Total Treatment Time: 50 Total Time in Timed Codes: Time in Timed Codes: 50     Treatment/Procedures Manual Therapy Tech, 1 + regions minutes: 10 Therapeutic Exercises minutes: 40          Start Time: 0831 Stop Time: 0921  The patient has been instructed to contact our clinic if any questions or problems should arise.

## 2024-05-10 NOTE — Progress Notes (Signed)
 HPI: Terry Burton returns today follow-up status post left total hip arthroplasty 03/31/2024.  States that he is overall trending towards improvement but still having some difficulty sleeping.  He is going to formal therapy.  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  Continues to take oxycodone  and Bufferin for pain.  He does have to use a cane periodically.  Notes that the first few steps after rest stiff.  Rates his pain to be 4 out of 10 at worst.  He continues taking aspirin  81 mg daily was on no aspirin  prior to surgery.  Review of systems: No fevers or chills.  Physical exam: General well-developed well-nourished male able to get on and off the exam table on his own.  Slight antalgic gait and no assistive device. Left hip: Good range of motion in the hip.  Surgical incisions healing well.  Spit stitch proximal one fourth of the incision.  No purulence no signs of infection.  Areas prepped with Betadine  stitches removed without incident.  Left calf supple nontender.  Dorsiflexion plantarflexion left ankle intact  Impression: Status post left total hip arthroplasty 03/31/2024  Plan: He will continue to work on range of motion strengthening left hip.  He will stop his aspirin  as he was on no aspirin  prior to surgery.  Will see him back in 1 month to see how he is overall progressing.  Questions were encouraged and answered by Dr. Vernetta and myself.

## 2024-06-07 ENCOUNTER — Encounter: Payer: Self-pay | Admitting: Orthopaedic Surgery

## 2024-06-07 ENCOUNTER — Ambulatory Visit: Admitting: Orthopaedic Surgery

## 2024-06-07 DIAGNOSIS — Z96642 Presence of left artificial hip joint: Secondary | ICD-10-CM

## 2024-06-07 NOTE — Progress Notes (Signed)
 The patient is now about 10 weeks status post a left total hip replacement to treat significant left hip osteoarthritis with osteonecrosis.  He actually had his right hip replaced back in 2012.  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  He is currently now in physical therapy and does report hip swelling.  He is walking without assistive device.  He is an active 64 year old gentleman.  On exam his left hip does move smoothly and fluidly with some pain and stiffness.  There are some swelling at the incision just posterior but no redness and this looks like more of just a hematoma that is resolving.  He will continue his physical therapy and work on strengthening his hips.  Will see him back in 3 months however standing AP pelvis at that visit.  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

## 2024-06-19 ENCOUNTER — Telehealth: Payer: Self-pay | Admitting: Orthopaedic Surgery

## 2024-06-19 NOTE — Telephone Encounter (Signed)
 Completed. Patient advised ready to pick up at the front desk.

## 2024-06-19 NOTE — Telephone Encounter (Signed)
 Pt dropped off return to work status form. Please call pt when ready for pick up. Pt number is [phone].

## 2024-06-20 ENCOUNTER — Telehealth: Payer: Self-pay | Admitting: Orthopaedic Surgery

## 2024-06-20 NOTE — Telephone Encounter (Signed)
 [name] Form faxed per patient request

## 2024-09-04 ENCOUNTER — Ambulatory Visit: Admitting: Orthopaedic Surgery
